# Patient Record
Sex: Male | Born: 1945
Health system: Southern US, Community
[De-identification: ages and names within clinical notes are randomized; demographics above are authoritative.]

## PROBLEM LIST (undated history)

## (undated) DIAGNOSIS — G43109 Migraine with aura, not intractable, without status migrainosus: Secondary | ICD-10-CM

## (undated) DIAGNOSIS — K219 Gastro-esophageal reflux disease without esophagitis: Secondary | ICD-10-CM

## (undated) DIAGNOSIS — R112 Nausea with vomiting, unspecified: Secondary | ICD-10-CM

## (undated) DIAGNOSIS — J189 Pneumonia, unspecified organism: Secondary | ICD-10-CM

## (undated) DIAGNOSIS — Z9889 Other specified postprocedural states: Secondary | ICD-10-CM

## (undated) DIAGNOSIS — K2270 Barrett's esophagus without dysplasia: Secondary | ICD-10-CM

## (undated) DIAGNOSIS — N189 Chronic kidney disease, unspecified: Secondary | ICD-10-CM

## (undated) DIAGNOSIS — C801 Malignant (primary) neoplasm, unspecified: Secondary | ICD-10-CM

## (undated) DIAGNOSIS — E559 Vitamin D deficiency, unspecified: Secondary | ICD-10-CM

## (undated) DIAGNOSIS — T7840XA Allergy, unspecified, initial encounter: Secondary | ICD-10-CM

## (undated) DIAGNOSIS — I10 Essential (primary) hypertension: Secondary | ICD-10-CM

## (undated) DIAGNOSIS — Z87442 Personal history of urinary calculi: Secondary | ICD-10-CM

## (undated) DIAGNOSIS — J45909 Unspecified asthma, uncomplicated: Secondary | ICD-10-CM

## (undated) DIAGNOSIS — Z860100 Personal history of colon polyps, unspecified: Secondary | ICD-10-CM

## (undated) DIAGNOSIS — M722 Plantar fascial fibromatosis: Secondary | ICD-10-CM

## (undated) DIAGNOSIS — M1990 Unspecified osteoarthritis, unspecified site: Secondary | ICD-10-CM

## (undated) DIAGNOSIS — K759 Inflammatory liver disease, unspecified: Secondary | ICD-10-CM

## (undated) HISTORY — DX: Vitamin D deficiency, unspecified: E55.9

## (undated) HISTORY — PX: KIDNEY STONE SURGERY: SHX686

## (undated) HISTORY — DX: Allergy, unspecified, initial encounter: T78.40XA

## (undated) HISTORY — PX: COLONOSCOPY: SHX174

## (undated) HISTORY — DX: Unspecified asthma, uncomplicated: J45.909

## (undated) HISTORY — DX: Unspecified osteoarthritis, unspecified site: M19.90

## (undated) HISTORY — DX: Gastro-esophageal reflux disease without esophagitis: K21.9

## (undated) HISTORY — DX: Personal history of colonic polyps: Z86.010

## (undated) HISTORY — DX: Essential (primary) hypertension: I10

## (undated) HISTORY — DX: Personal history of colon polyps, unspecified: Z86.0100

## (undated) HISTORY — DX: Chronic kidney disease, unspecified: N18.9

## (undated) HISTORY — DX: Barrett's esophagus without dysplasia: K22.70

## (undated) HISTORY — DX: Migraine with aura, not intractable, without status migrainosus: G43.109

## (undated) HISTORY — PX: POLYPECTOMY: SHX149

---
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
## 1995-02-08 DIAGNOSIS — J45909 Unspecified asthma, uncomplicated: Secondary | ICD-10-CM

## 1995-02-08 HISTORY — DX: Unspecified asthma, uncomplicated: J45.909

## 2002-09-11 ENCOUNTER — Observation Stay (HOSPITAL_COMMUNITY): Admission: AD | Admit: 2002-09-11 | Discharge: 2002-09-13 | Payer: Self-pay | Admitting: Pulmonary Disease

## 2002-09-11 ENCOUNTER — Encounter: Payer: Self-pay | Admitting: Pulmonary Disease

## 2002-11-04 ENCOUNTER — Inpatient Hospital Stay (HOSPITAL_COMMUNITY): Admission: EM | Admit: 2002-11-04 | Discharge: 2002-11-06 | Payer: Self-pay | Admitting: Emergency Medicine

## 2002-11-04 ENCOUNTER — Encounter: Payer: Self-pay | Admitting: Internal Medicine

## 2002-11-05 ENCOUNTER — Encounter: Payer: Self-pay | Admitting: Internal Medicine

## 2002-11-06 ENCOUNTER — Ambulatory Visit (HOSPITAL_COMMUNITY): Admission: RE | Admit: 2002-11-06 | Discharge: 2002-11-06 | Payer: Self-pay | Admitting: General Surgery

## 2002-11-06 ENCOUNTER — Encounter (INDEPENDENT_AMBULATORY_CARE_PROVIDER_SITE_OTHER): Payer: Self-pay | Admitting: Specialist

## 2002-11-25 ENCOUNTER — Ambulatory Visit (HOSPITAL_COMMUNITY): Admission: RE | Admit: 2002-11-25 | Discharge: 2002-11-25 | Payer: Self-pay | Admitting: Pulmonary Disease

## 2002-11-25 ENCOUNTER — Encounter: Payer: Self-pay | Admitting: Pulmonary Disease

## 2003-02-08 HISTORY — PX: CHOLECYSTECTOMY: SHX55

## 2004-03-01 ENCOUNTER — Ambulatory Visit: Payer: Self-pay | Admitting: Pulmonary Disease

## 2004-09-07 ENCOUNTER — Ambulatory Visit: Payer: Self-pay | Admitting: Pulmonary Disease

## 2004-12-15 ENCOUNTER — Ambulatory Visit: Payer: Self-pay | Admitting: Pulmonary Disease

## 2005-12-14 ENCOUNTER — Ambulatory Visit: Payer: Self-pay | Admitting: Pulmonary Disease

## 2006-03-30 ENCOUNTER — Ambulatory Visit: Payer: Self-pay | Admitting: Pulmonary Disease

## 2006-05-01 ENCOUNTER — Ambulatory Visit: Payer: Self-pay | Admitting: Pulmonary Disease

## 2006-11-22 ENCOUNTER — Ambulatory Visit: Payer: Self-pay | Admitting: Pulmonary Disease

## 2006-12-08 ENCOUNTER — Ambulatory Visit: Payer: Self-pay | Admitting: Pulmonary Disease

## 2006-12-08 LAB — CONVERTED CEMR LAB
ALT: 43 units/L (ref 0–53)
Alkaline Phosphatase: 95 units/L (ref 39–117)
BUN: 11 mg/dL (ref 6–23)
Basophils Relative: 0.7 % (ref 0.0–1.0)
Bilirubin Urine: NEGATIVE
CO2: 31 meq/L (ref 19–32)
Calcium: 9.1 mg/dL (ref 8.4–10.5)
Creatinine, Ser: 1.2 mg/dL (ref 0.4–1.5)
Eosinophils Relative: 1.9 % (ref 0.0–5.0)
Glucose, Bld: 112 mg/dL — ABNORMAL HIGH (ref 70–99)
HCT: 45.7 % (ref 39.0–52.0)
Hemoglobin: 16 g/dL (ref 13.0–17.0)
Leukocytes, UA: NEGATIVE
Monocytes Absolute: 0.5 10*3/uL (ref 0.2–0.7)
Monocytes Relative: 11.1 % — ABNORMAL HIGH (ref 3.0–11.0)
Nitrite: NEGATIVE
Potassium: 4.9 meq/L (ref 3.5–5.1)
RDW: 11.8 % (ref 11.5–14.6)
Specific Gravity, Urine: 1.02 (ref 1.000–1.03)
Total Bilirubin: 1.2 mg/dL (ref 0.3–1.2)
Total Protein, Urine: NEGATIVE mg/dL
Total Protein: 6.6 g/dL (ref 6.0–8.3)
Urobilinogen, UA: 0.2 (ref 0.0–1.0)
VLDL: 10 mg/dL (ref 0–40)
WBC: 4.8 10*3/uL (ref 4.5–10.5)
pH: 6 (ref 5.0–8.0)

## 2006-12-16 DIAGNOSIS — Z9189 Other specified personal risk factors, not elsewhere classified: Secondary | ICD-10-CM | POA: Insufficient documentation

## 2006-12-16 DIAGNOSIS — J45909 Unspecified asthma, uncomplicated: Secondary | ICD-10-CM

## 2006-12-16 DIAGNOSIS — K219 Gastro-esophageal reflux disease without esophagitis: Secondary | ICD-10-CM

## 2006-12-16 DIAGNOSIS — N200 Calculus of kidney: Secondary | ICD-10-CM

## 2006-12-16 DIAGNOSIS — D126 Benign neoplasm of colon, unspecified: Secondary | ICD-10-CM | POA: Insufficient documentation

## 2006-12-16 DIAGNOSIS — F411 Generalized anxiety disorder: Secondary | ICD-10-CM | POA: Insufficient documentation

## 2006-12-16 DIAGNOSIS — J301 Allergic rhinitis due to pollen: Secondary | ICD-10-CM

## 2007-04-05 ENCOUNTER — Telehealth: Payer: Self-pay | Admitting: Pulmonary Disease

## 2007-07-30 ENCOUNTER — Telehealth (INDEPENDENT_AMBULATORY_CARE_PROVIDER_SITE_OTHER): Payer: Self-pay | Admitting: *Deleted

## 2007-07-31 ENCOUNTER — Ambulatory Visit: Payer: Self-pay | Admitting: Pulmonary Disease

## 2007-08-20 ENCOUNTER — Ambulatory Visit: Payer: Self-pay | Admitting: Pulmonary Disease

## 2007-08-20 ENCOUNTER — Ambulatory Visit: Payer: Self-pay | Admitting: Internal Medicine

## 2007-11-21 ENCOUNTER — Ambulatory Visit: Payer: Self-pay | Admitting: Pulmonary Disease

## 2007-12-03 ENCOUNTER — Telehealth: Payer: Self-pay | Admitting: Pulmonary Disease

## 2007-12-10 ENCOUNTER — Ambulatory Visit: Payer: Self-pay | Admitting: Pulmonary Disease

## 2008-02-26 DIAGNOSIS — R51 Headache: Secondary | ICD-10-CM

## 2008-02-26 DIAGNOSIS — R519 Headache, unspecified: Secondary | ICD-10-CM | POA: Insufficient documentation

## 2008-02-26 DIAGNOSIS — F909 Attention-deficit hyperactivity disorder, unspecified type: Secondary | ICD-10-CM

## 2008-10-24 ENCOUNTER — Encounter: Payer: Self-pay | Admitting: Adult Health

## 2008-10-24 ENCOUNTER — Ambulatory Visit: Payer: Self-pay | Admitting: Internal Medicine

## 2008-10-24 ENCOUNTER — Telehealth: Payer: Self-pay | Admitting: Pulmonary Disease

## 2008-10-24 DIAGNOSIS — R5383 Other fatigue: Secondary | ICD-10-CM

## 2008-10-24 DIAGNOSIS — R5381 Other malaise: Secondary | ICD-10-CM

## 2008-10-28 LAB — CONVERTED CEMR LAB
ALT: 40 units/L (ref 0–53)
Albumin: 3.7 g/dL (ref 3.5–5.2)
CO2: 31 meq/L (ref 19–32)
Calcium: 9 mg/dL (ref 8.4–10.5)
Creatinine, Ser: 1.1 mg/dL (ref 0.4–1.5)
Glucose, Bld: 121 mg/dL — ABNORMAL HIGH (ref 70–99)
Hemoglobin, Urine: NEGATIVE
Leukocytes, UA: NEGATIVE
Nitrite: NEGATIVE
PSA: 0.92 ng/mL (ref 0.10–4.00)
Total Protein: 6.7 g/dL (ref 6.0–8.3)
Urobilinogen, UA: 0.2 (ref 0.0–1.0)

## 2009-02-03 ENCOUNTER — Ambulatory Visit: Payer: Self-pay | Admitting: Pulmonary Disease

## 2009-02-08 LAB — CONVERTED CEMR LAB
Basophils Relative: 0.6 % (ref 0.0–3.0)
CO2: 28 meq/L (ref 19–32)
Eosinophils Absolute: 0.1 10*3/uL (ref 0.0–0.7)
GFR calc non Af Amer: 80.74 mL/min (ref >60)
Glucose, Bld: 97 mg/dL (ref 70–99)
Hemoglobin: 15.4 g/dL (ref 13.0–17.0)
Lymphocytes Relative: 23.4 % (ref 12.0–46.0)
MCHC: 33.4 g/dL (ref 30.0–36.0)
Monocytes Relative: 9.2 % (ref 3.0–12.0)
Neutro Abs: 3.6 10*3/uL (ref 1.4–7.7)
Potassium: 4.5 meq/L (ref 3.5–5.1)
RBC: 4.83 M/uL (ref 4.22–5.81)
Sodium: 142 meq/L (ref 135–145)
VLDL: 13 mg/dL (ref 0.0–40.0)

## 2009-07-28 ENCOUNTER — Telehealth (INDEPENDENT_AMBULATORY_CARE_PROVIDER_SITE_OTHER): Payer: Self-pay | Admitting: *Deleted

## 2009-08-31 ENCOUNTER — Telehealth (INDEPENDENT_AMBULATORY_CARE_PROVIDER_SITE_OTHER): Payer: Self-pay | Admitting: *Deleted

## 2009-09-22 ENCOUNTER — Encounter (INDEPENDENT_AMBULATORY_CARE_PROVIDER_SITE_OTHER): Payer: Self-pay | Admitting: *Deleted

## 2010-01-29 ENCOUNTER — Encounter: Payer: Self-pay | Admitting: Pulmonary Disease

## 2010-01-29 ENCOUNTER — Ambulatory Visit: Payer: Self-pay | Admitting: Pulmonary Disease

## 2010-01-29 LAB — CONVERTED CEMR LAB
ALT: 31 units/L (ref 0–53)
AST: 21 units/L (ref 0–37)
Albumin: 3.9 g/dL (ref 3.5–5.2)
Alkaline Phosphatase: 96 units/L (ref 39–117)
Basophils Relative: 0.3 % (ref 0.0–3.0)
Bilirubin Urine: NEGATIVE
Blood, UA: NEGATIVE
Calcium: 9.2 mg/dL (ref 8.4–10.5)
Cholesterol: 194 mg/dL (ref 0–200)
Creatinine, Ser: 1.2 mg/dL (ref 0.4–1.5)
Eosinophils Relative: 0.5 % (ref 0.0–5.0)
GFR calc non Af Amer: 67.14 mL/min (ref >60.00)
HDL: 50.2 mg/dL (ref >39.00)
LDL Cholesterol: 131 mg/dL — ABNORMAL HIGH (ref 0–99)
Lymphocytes Relative: 15.8 % (ref 12.0–46.0)
Neutrophils Relative %: 74.2 % (ref 43.0–77.0)
RBC: 4.93 M/uL (ref 4.22–5.81)
Total CHOL/HDL Ratio: 4
Total Protein, Urine: NEGATIVE mg/dL
Total Protein: 6.5 g/dL (ref 6.0–8.3)
Triglycerides: 64 mg/dL (ref 0.0–149.0)
Urine Glucose: NEGATIVE mg/dL
WBC: 8 10*3/uL (ref 4.5–10.5)
pH: 6 (ref 5.0–8.0)

## 2010-03-09 NOTE — Progress Notes (Signed)
  Phone Note Other Incoming   Request: Send information Summary of Call: Request for records received from [facility]. Request forwarded to [facility].     

## 2010-03-09 NOTE — Letter (Signed)
Summary: Endoscopy Letter  [facility]  [address] [place], [place] [address]   Phone: [phone]  Fax: [phone]      September 22, 2009 MRN: [id number]   Benjamin Cortez [address] [place], [place]  [address]   Dear Benjamin Cortez,   According to your medical record, it is time for you to schedule an Endoscopy. Endoscopic screening is recommended for patients with certain upper digestive tract conditions because of associated increased risk for cancers of the upper digestive system.  This letter has been generated based on the recommendations made at the time of your prior procedure. If you feel that in your particular situation this may no longer apply, please contact our office.  Please call our office at [phone]) to schedule this appointment or to update your records at your earliest convenience.  Thank you for cooperating with [place] to provide you with the very best care possible.   Sincerely,  Hedwig Morton. Juanda Chance, M.D.  [facility] [phone]

## 2010-03-11 NOTE — Assessment & Plan Note (Signed)
Summary: yearly follow up visit/kcw   CC:  Yearly ROV & review of mult medical problems....  History of Present Illness: 65 y/o WM here for a follow up visit...    ~  February 03, 2009:  he's had a good yr x episode of gastroenteritis in Sep (wife had salmonella)... notes some reflux & takes OMEP 20mg /d w/ control of symptoms... no other complaints or concerns.   ~  January 29, 2010:  he continues to do well- no new complaints or concerns x left hip bursitis & mild lumbar disc degen Rx'd by Alinda Sierras w/ shot, heat, PT, etc & improved...  he denies CP, palpit, SOB, etc;  BP normal;  GERD regulated by Omep OTC;  colonoscopy due 2013 & he denies symptoms;  he had the 2011 Flu vaccine already...    Current Problems:   ALLERGIC RHINITIS (ICD-477.9) - on Claritin OTC Prn... prev testing showed mold sensitivity.  ASTHMATIC BRONCHITIS, ACUTE (ICD-466.0) - takes MUCINEX 1-2 Bid Prn & he uses his wife's cough syrup Prn... he would like ZPak for Prn use...  CHEST WALL PAIN, HX OF (ICD-V15.89) - on ASA 325mg /d...  ~  he participated in a [place] research study on atherosclerosis in 2000- as part of the protocol he had:      ** Cardiac CT - calcium score= zero...      ** Carotid Dopplers - negative, no signif blockage...      ** ABI's were normal...  ~  NuclearStressTest 9/04 showed mildly abn EKG but neg images- w/o ischemia or infarct, EF=67%...  GERD (ICD-530.81) - on OMEPRAZOLE 20mg /d (OTC generic med).  ~  last EGD 9/04 by DrDBrodie showed 3cm HH r/o barrett's- bx showed mild inflamm only... subseq had GB removed 9/04 by DrHoxsworth...  COLONIC POLYPS (ICD-211.3)  ~  last colonoscopy 12/03 by DrDBrodie showed 5mm polyp- hyperplastic on bx... f/u planned 2013.  RENAL CALCULUS (ICD-592.0) - hx kidney stone passed in 1989 & saw DrPeterson in past..  Hx of HEADACHE (ICD-784.0) - prev HA eval by DrLewitt in 2005... Dx w/ migraines w/ visual symptoms... not currently active or requiring  meds...  ATTENTION DEFICIT DISORDER, HX OF (ICD-V11.8) - on RITALIN 20mg  Prn (?he uses son's Rx)... prev eval by Psychiatry/ Psychology w/ Adult ADD on Ritalin 20mg /d and doing well... he wants to continue Rx due to work, home life, etc... tolerates med well & denies side effects etc...  ANXIETY (ICD-300.00)   Preventive Screening-Counseling & Management  Alcohol-Tobacco     Smoking Status: quit     Year Quit: 1979     Pack years: 32yrs, 1ppd  Allergies: 1)  ! Penicillin 2)  ! Tetracycline 3)  Codeine  Comments:  Nurse/Medical Assistant: The patient's medications and allergies were reviewed with the patient and were updated in the Medication and Allergy Lists.  Past History:  Past Medical History: ALLERGIC RHINITIS (ICD-477.9) ASTHMATIC BRONCHITIS, ACUTE (ICD-466.0) CHEST WALL PAIN, HX OF (ICD-V15.89) GERD (ICD-530.81) COLONIC POLYPS (ICD-211.3) RENAL CALCULUS (ICD-592.0) Hx of HEADACHE (ICD-784.0) ATTENTION DEFICIT DISORDER, HX OF (ICD-V11.8) ANXIETY (ICD-300.00)  Past Surgical History: S/P appendectomy S/P cholecystectomy  Family History: Reviewed history from 10/24/2008 and no changes required. emphysema - mother allergies - mother heart disease - PGF died of MI (age 76s) rheumatism - mother  Social History: Reviewed history from 10/24/2008 and no changes required. 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         All other systems reviewed and neg x as noted...  Vital Signs:  Patient profile:   65 year old male Height:      67 inches Weight:      227.50 pounds BMI:     35.76 O2 Sat:      98 % on Room air Temp:     98.6 degrees F oral Pulse rate:   67 / minute BP sitting:   134 / 82  (right arm) Cuff size:   regular  Vitals Entered By: Randell Loop CMA (January 29, 2010 9:35 AM)  O2 Sat at Rest %:  98 O2 Flow:  Room air CC: Yearly ROV & review of mult medical problems... Is Patient Diabetic? No Pain Assessment Patient in pain? no      Comments no changes in meds today    Physical Exam  Additional Exam:  WD, WN, 65 y/o WM in NAD... GENERAL:  Alert & oriented; pleasant & cooperative... HEENT:  [place]/AT, EOM-wnl, PERRLA, Fundi-benign, EACs-clear, TMs-wnl, NOSE-clear, THROAT-clear & wnl. NECK:  Supple w/ full ROM; no JVD; normal carotid impulses w/o bruits; no thyromegaly or nodules palpated; no lymphadenopathy. CHEST:  Clear to P & A; without wheezes/ rales/ or rhonchi. HEART:  Regular Rhythm; without murmurs/ rubs/ or gallops. ABDOMEN:  Soft & nontender; normal bowel sounds; no organomegaly or masses detected, no guarding or rebound or bruits.  EXT: without deformities or arthritic changes; no varicose veins/ venous insuffic/ or edema. NEURO:  CN's intact; motor testing normal; sensory testing normal; gait normal & balance OK. DERM:  No lesions noted; no rash etc...    MISC. Report  Procedure date:  01/29/2010  Findings:      Data Reviewed:  ~  FASTING blood work 01/29/10...    Impression & Recommendations:  Problem # 1:  PHYSICAL EXAMINATION (ICD-V70.0)  Orders: TLB-BMP (Basic Metabolic Panel-BMET) (80048-METABOL) TLB-Hepatic/Liver Function Pnl (80076-HEPATIC) TLB-CBC Platelet - w/Differential (85025-CBCD) TLB-Lipid Panel (80061-LIPID) TLB-TSH (Thyroid Stimulating Hormone) (84443-TSH) TLB-PSA (Prostate Specific Antigen)  (84153-PSA) TLB-Udip w/ Micro (81001-URINE) T-Vitamin D (25-Hydroxy) ([address])  Problem # 2:  ASTHMATIC BRONCHITIS, ACUTE (ICD-466.0) Stable>  we discussed ZPak for Prn use this winter... The following medications were removed from the medication list:    Tussionex Pennkinetic Er 8-10 Mg/90ml Lqcr (Chlorpheniramine-hydrocodone) .Marland Kitchen... 1 tsp two times a day as needed cough His updated medication list for this problem includes:    Mucinex 600 Mg Tb12 (Guaifenesin) .Marland Kitchen... As needed  Problem # 3:  GERD (ICD-530.81) Stable on the PPI Rx... His updated medication list for this problem includes:    Omeprazole 20 Mg Cpdr (Omeprazole) .Marland Kitchen... Take 1 tablet by mouth once a day  Problem # 4:  COLONIC POLYPS (ICD-211.3) He is up to date and due 2013...  Problem # 5:  Hx of HEADACHE (ICD-784.0) Stable w/o recurrent severe HAs... His updated medication list for this problem includes:    Cvs Aspirin 325 Mg Tabs (Aspirin) .Marland Kitchen... Take 1 tablet by mouth once a day  Problem # 6:  ATTENTION DEFICIT DISORDER, HX OF (ICD-V11.8) He uses the Ritalin 20mg  Prn...  Problem # 7:  OTHER MEDICAL PROBLEMS AS NOTED>>  Complete Medication List: 1)  Claritin 10 Mg Tabs (Loratadine) .... Take 1 tablet by mouth once a day 2)  Mucinex 600 Mg Tb12 (Guaifenesin) .... As needed 3)  Cvs Aspirin 325 Mg Tabs (Aspirin) .... Take 1 tablet by mouth once a day 4)  Omeprazole 20 Mg Cpdr (Omeprazole) .... Take 1 tablet by mouth once a day 5)  Ritalin 20 Mg Tabs (Methylphenidate hcl) .... Take as directed...  Patient Instructions: 1)  Today we updated your med list- see below.... 2)  Today we did your follow up FASTINg blood work... please call the "phone tree" in a few days for your lab results.Marland KitchenMarland Kitchen 3)  Let's get on track w/ our diet & exercise program... 4)  Call for any problems.Marland KitchenMarland Kitchen 5)  Please schedule a follow-up appointment in 1 year, sooner as needed.   Immunization History:  Influenza Immunization History:     Influenza:  historical (01/13/2010)

## 2010-06-25 NOTE — H&P (Signed)
NAME:  Benjamin Cortez, CAVITT NO.:  [id number]   MEDICAL RECORD NO.:  [id number]                   PATIENT TYPE:  EMS   LOCATION:  ED                                   [facility]ACILITY[facility]  [facility]   PHYSICIAN:  Iva Boop, M.D. [facility]           DATE OF BIRTH:  1945/11/13   DATE OF ADMISSION:  11/04/2002  DATE OF DISCHARGE:                                HISTORY & PHYSICAL   CHIEF COMPLAINT:  Fever and upper abdominal pain into the left upper  quadrant.   HISTORY:  Benjamin Cortez is a 65 year old white male with history of GERD, asthma,  ADD, ureteral lithiasis, and colon polyps.  He is status post appendectomy.  He was admitted to the cardiology service in August 2004 with chest pain,  had a negative cardiac workup at that time.  He had an episode of epigastric  pain on October 12, 2002 which radiated to his shoulders and neck.  This  episode lasted about two days and resolved.  He was seen thereafter by Dr.  Juanda Chance on October 16, 2002 and was started on Protonix.  Had upper  endoscopy scheduled which was done on September 21 which was normal with the  exception of possible Barrett's.  He had abdominal ultrasound on September  21 which was positive for cholelithiasis and evidence of probable chronic  cholecystitis with gallbladder wall thickening at approximately 4 mm.  There  was no pericholecystic fluid noted.  Common bile duct was 4 mm.  He was  referred to Dr. Earlene Plater for surgery.  He says that he began having some fever  on September 24 when he was seen in Dr. Jinny Sanders office that was documented  at 100.6.  He has had somewhat progressive abdominal since in the upper  abdomen and now more localized to the left upper quadrant with radiation  into his shoulders and chest.  He says he has had some increased discomfort  with inspiration.  He has had temperatures in the 100 range all weekend.  His temperature was 100.6 this morning and with continued pain he called the  office  and was advised to come to the emergency room.  He has been using  Vicodin at home for pain as well.   Laboratory studies in the ER:  Hemoglobin of 8.7, hemoglobin 14.5,  hematocrit of 42.1.  The remainder of his labs are pending at this time.   MEDICATIONS:  1. Protonix 40 p.o. daily.  2. Ritalin 20 mg daily.   ALLERGIES:  1. PENICILLIN which causes a rash.  2. TETRACYCLINE which causes a rash.   PAST HISTORY:  As outlined above.   SOCIAL HISTORY:  The patient is married.  He is no tobacco x34 years, no  regular ETOH.  He is employed in Dance movement psychotherapist.   FAMILY HISTORY:  Noncontributory.   REVIEW OF SYSTEMS:  Negative other than above.  PHYSICAL EXAMINATION:  GENERAL:  Well-developed white male, semi-sedated in  the ER post Dilaudid and Phenergan per the ER M.D.  He is arousable.  VITAL SIGNS:  Temperature is 100.6, blood pressure 147/80, pulse in the 90s,  saturation is 94 on room air.  HEENT:  Nontraumatic, normocephalic.  EOMI, PERLA, sclerae anicteric.  NECK:  Supple without nodes.  CARDIOVASCULAR:  Regular rate and rhythm with S1 and S2.  No murmur, rub, or  gallop.  PULMONARY:  Clear to A&P.  ABDOMEN:  Soft, bowel sounds are active.  He is tender in the epigastrium  and left upper quadrant.  There is no guarding or rebound, no mass or  hepatosplenomegaly felt.  RECTAL:  Exam is not done at this time - the patient in the ER hallway.  EXTREMITIES:  No clubbing, cyanosis, or edema.  NEUROLOGIC:  Nonfocal.   IMPRESSION:  8. A 65 year old white male with cholelithiasis, probable chronic     cholecystitis, now with fevers and left upper quadrant pain; rule out     biliary pancreatitis.  2. Gastroesophageal reflux disease.  3. Attention deficit disorder.  4. History of ureterolithiasis.  5. History of asthmatic bronchitis.  6. History of colon polyps.  7. Status post appendectomy.   PLAN:  The patient is admitted to the service of Dr. Stan Head who is   covering the hospital.  He will be hydrated, kept n.p.o., pain control.  Will cover with IV antibiotics.  Check chest x-ray PA and lateral, and  depending on his labs will consider need for CT scan versus repeat  ultrasound.  Eventually he will need surgical consultation this admission.  For details please see the orders.     Mike Gip, P.A.-C. [facility]                Iva Boop, M.D. [facility]    AE/MEDQ  D:  11/04/2002  T:  11/04/2002  Job:  [id number]   cc:   Sheppard Plumber. Earlene Plater, M.D.  [address] [place]  [place] [address]  Fax: [phone]

## 2010-06-25 NOTE — Assessment & Plan Note (Signed)
[facility]                             PULMONARY OFFICE NOTE   AKIN, YI                           MRN:          [id number]  DATE:03/30/2006                            DOB:          Jun 30, 1945    HISTORY OF PRESENT ILLNESS:  Patient is a 65 year old white male patient  of Dr. Kriste Basque, has a known history of asthmatic bronchitis, presents for  an acute office visit.  Patient complains of a one-week history of  productive cough with thick yellow sputum, nasal congestion, sore  throat.  Patient has been using over the counter cough products without  any relief.  Patient denies any chest pain, orthopnea, PND or leg  swelling.  No recent travel or antibiotic use.   PAST MEDICAL HISTORY:  Reviewed.   CURRENT MEDICATIONS:  Reviewed.   PHYSICAL EXAMINATION:  GENERAL:  The patient is a pleasant male in no  acute distress.  He is afebrile.  VITAL SIGNS:  normal , saturation 96% on room air.  HEENT:  Nasal mucosa with some mild erythema.  Nontender sinuses.  Posterior pharynx is clear.  NECK:  Supple without adenopathy.  LUNGS:  Sounds are clear to auscultation bilaterally.  CARDIAC:  Regular rate and rhythm.  ABDOMEN:  Soft nontender.  EXTREMITIES:  Warm without any edema.   IMPRESSION/PLAN:  Acute upper respiratory infection.  Patient is to  begin a  Z-Pack, Mucinex DM twice daily.  Endal HD 8 ounces, one to two  teaspoons every 4-6 hours for cough.  Patient to return back with Dr.  Kriste Basque as scheduled or sooner if needed.      Rubye Oaks, NP  Electronically Signed      Lonzo Cloud. Kriste Basque, MD  Electronically Signed   TP/MedQ  DD: 03/30/2006  DT: 03/30/2006  Job #: [id number]

## 2010-06-25 NOTE — Consult Note (Signed)
NAME:  Benjamin Cortez, BORCHERS NO.:  [id number]   MEDICAL RECORD NO.:  [id number]                   PATIENT TYPE:  INP   LOCATION:  0479                                 FACILITY:  [facility]   PHYSICIAN:  Lorne Skeens. Hoxworth, M.D.          DATE OF BIRTH:  06-19-1945   DATE OF CONSULTATION:  11/04/2002  DATE OF DISCHARGE:                                   CONSULTATION   CHIEF COMPLAINT:  Abdominal pain, chest pain, fever.   HISTORY OF PRESENT ILLNESS:  I was asked by Dr. Kriste Basque and Dr. Juanda Chance to  evaluate Mr. Benjamin Cortez.  He is a 65 year old white male who was in his usual  state of health until September 11, 2002, when he presented with acute severe  epigastric pain radiating up to his chest and both shoulders.  He describes  a constant aching pain.  There was no associated nausea, vomiting, or fever.  The patient was admitted to the hospital at that time, and underwent a  cardiac work-up that was negative.  He subsequently has had a further  outpatient cardiac work-up by Dr. Gerri Spore that was nonrevealing.  The  patient's pain required a lot of medication at that time, but gradually  improved over about three days, and he was discharged for further outpatient  follow up.  He states about a week later he had a similar episode of pain  while he was out of town that lasted a day or two.  Since that time, he has  continued to have some nagging epigastric pain that radiates toward his  chest and both costal margins.  Still has no nausea or vomiting.  The pain  is not exacerbated or relieved by eating.  He feels the pain is occasionally  worse with physical activity.   The patient was seen by Dr. Lina Sar and had an evaluation including an  upper endoscopy that revealed a questionable small area of Barrett's  esophagus, but no ulcer or significant inflammation.  Gallbladder ultrasound  was obtained, which revealed multiple gallstones, and slightly thickened  gallbladder  wall.  This was performed October 29, 2002.  The patient  subsequently saw Dr. Kendrick Ranch in our office, and with the above history and  findings, cholecystectomy was recommended.  This has actually been scheduled  for later this week, but the patient presents today with three days of  initially low-grade fever that became more significant over the last couple  of days, and recurrent epigastric pain that has not been quite as severe as  his previous episodes.  Over the last 24 hours, the pain appeared to be more  localized in the left upper quadrant and left subcostal area.  Again, no  nausea, vomiting.  No chills.  No dark urine or scleral icterus noted.  Bowel movements have been normal.   The patient gives a history about 12 years ago of  having possible  pancreatitis.  This was evaluated by Dr. Juanda Chance, and it sounds as though  gallbladder sludge was noted at that time on ultrasound, and apparently  there was an attempt at ERCP per the patient's recollection.  He states he  has had occasional mild nagging right subcostal to right flank pain for a  number of years.   PAST MEDICAL HISTORY:  1. Surgical - appendectomy in the 1950's.  2. Medical - he has been treated for ureterolithiasis.  He is followed for     GERD, anxiety/ADD.  He has had colonoscopy for colon polys.  He has a     history of seasonal allergies with bronchitis.   CURRENT MEDICATIONS:  1. Ritalin 20 mg a day.  2. Protonix 40 mg a day.   ALLERGIES:  1. PENICILLIN.  2. TETRACYCLINE.  3. CODEINE.   SOCIAL HISTORY:  He is married.  He does not smoke cigarettes or drink  alcohol.   FAMILY HISTORY:  Father died of leukemia and had gallbladder disease.  Mother died of unknown causes.  He has a brother and sister alive and well.   REVIEW OF SYSTEMS:  GENERAL:  Positive for fever this illness.  No weight  change.  HEENT:  Positive for some seasonal allergies.  RESPIRATORY:  Denies  current shortness of breath, cough,  or sputum production.  CARDIAC:  As in  history of present illness.  ABDOMEN:  As in history of present illness.  GU:  No urinary burning, frequency.   PHYSICAL EXAMINATION:  VITAL SIGNS:  Temperature is 100.4, pulse 90, blood  pressure 147/79, respirations 20.  GENERAL:  He is a well-nourished, well-developed, white male in no acute  distress.  SKIN:  Warm and dry without rash or infection.  LYMPH NODES:  No cervical, supraclavicular, or inguinal nodes palpable.  HEENT:  Eyes - sclerae nonicteric.  Pupils equal, round and reactive to  light.  Oropharynx clear without mass.  NECK:  No palpable mass or thyromegaly.  CHEST:  Clear to auscultation without pleural rub.  CARDIAC:  Regular rhythm without murmurs.  Peripheral pulses intact.  ABDOMEN:  There is mild tenderness across the epigastrium and both subcostal  areas, somewhat greater on the left with no guarding.  No masses or  organomegaly.  EXTREMITIES:  No deformity or joint swelling.  NEUROLOGIC:  Alert, fully oriented.  Motor and sensory exams grossly normal.   LABORATORY DATA:  This admission, CBC, electrolytes, LFTs, amylase, lipase,  and urinalysis, all within normal limits.   IMAGING:  Chest x-ray is normal.  Repeat gallbladder ultrasound shows  stones, but no wall thickening, pericholecystic fluid, or other evidence of  acute cholecystitis.   CT scan of the chest and abdomen September 11, 2002 were unremarkable.   ASSESSMENT AND PLAN:  Certainly his acute presentation in August with acute  epigastric pain, negative cardiac work-up, and ultrasound showing stones  clearly sounds like biliary colic.  He apparently has some questionable  remote history of pancreatitis.  His current presentation is a little more  unusual with low-grade fever despite no other evidence of cholecystitis on  ultrasound, and more of a tendency toward left upper quadrant pain.  This is clinically consistent with acute pancreatitis, although this is  not  confirmed with elevated enzymes.  He certainly has persistent and recurrent  upper abdominal pain with a thorough work-up to date negative, except for  gallstones.  Although his current presentation is a little atypical, I  certainly  believe with the overall presentation that cholecystectomy with  cholangiogram is indicated.  I do not feel any further work-up is mandatory.  Will discuss with GI medicine, but I would recommend proceeding in the near  future with laparoscopic cholecystectomy with cholangiogram.                                               Sharlet Salina T. Hoxworth, M.D.    Tory Emerald  D:  11/04/2002  T:  11/04/2002  Job:  [id number]

## 2010-06-25 NOTE — Discharge Summary (Signed)
NAME:  Benjamin Cortez, Benjamin Cortez NO.:  [id number]   MEDICAL RECORD NO.:  [id number]                   PATIENT TYPE:  INP   LOCATION:  2025                                 FACILITY:  [facility]   PHYSICIAN:  Lonzo Cloud. Kriste Basque, M.D. [facility]            DATE OF BIRTH:  July 18, 1945   DATE OF ADMISSION:  09/11/2002  DATE OF DISCHARGE:  09/13/2002                                 DISCHARGE SUMMARY   FINAL DIAGNOSES:  1. Admitted September 11, 2002 with severe chest and epigastric pain.     Evaluation revealed normal EKG and cardiac enzymes and patient improved     with narcotic analgesics and proton pump inhibitor therapy.  2. Chest wall pain -- severe, no signs of underlying cardiovascular disease.  3. History of hiatus hernia and gastroesophageal reflux disease with     epigastric discomfort and reflux esophagitis -- improved with proton pump     inhibitor therapy and Carafate. Further evaluation with GI and Dr. Lina Sar if symptoms persist.  4. History of asthmatic bronchitis and allergies with mold sensitivity on     testing in the past.  5. History of colon polyps with 5-mm, hyperplastic polyp removed December     2003 by Dr. Lina Sar.  6. History of adult attention-deficit disorder on Ritalin 20 mg a day     followed by Dr. Morene Antu.  7. History of kidney stones in 1979.  8. History of anxiety with increased stress at work recently.   ALLERGIES:  PENICILLIN, TETRACYCLINE and CODEINE   BRIEF HISTORY AND PHYSICAL:  The patient is a 65 year old white man known to  me, and last seen in December 2003 for a complete physical.  He presented to  the office on September 11, 2002 with onset of severe chest pain and upper  abdominal pain of 1 days duration.  He noted onset of midchest pain,  radiation to the shoulders with a sharp, burning quality and tenderness on  palpation in the day prior to admission. He had progressive symptoms as the  day went on and felt that this was  worse than my kidney stones.  He denied  nausea, vomiting, or diaphoresis.  He had no history of known heart disease.  He had no significant cardiac risk factors with a negative family history,  nonsmoker, and no cholesterol, diabetes, or blood pressure problems in the  past.  He did admit to marked increase in stress lately at work with a big  project and responsibility.  In addition, he had some increasing reflux  symptoms with pain in the epigastrium as well despite Protonix 40 mg p.o.  daily.  He had a previous endoscopy in 1999 by Dr. Lina Sar with a 3 cm  hiatus hernia and some reflux esophagitis noted at that time.  Of note, the  patient participated in the [facility]  of arteriosclerosis at [facility] in 2001.  During that evaluation he was found to have no  coronary calcification and normal carotid Dopplers (CT chest scan was done  in this study).   PAST MEDICAL HISTORY:  He has a history of asthmatic bronchitis and  allergies with known mold sensitivity on testing in the past.  As noted, he  has a history of hiatus hernia, gastroesophageal reflux disease, with a  previous endoscopy in 1999 showing a 3 cm hernia and reflux esophagitis.  He  has a history of colon polyps with a colonoscopy in December 2003 with Dr.  Lina Sar with biopsy of a 5-mm, hyperplastic polyp at that time.  He has  a history of adult ADD and takes Ritalin 20 mg a day under the direction of  Dr. Morene Antu.  He has a history of kidney stones, the last in 1979.  He  has a history of anxiety with increased stress recently as noted.   PHYSICAL EXAMINATION:  Physical examination revealed a 65 year old gentleman  in acute distress with chest and epigastric pain.  Blood pressure 150/90,  pulse 88 per minute and regular, respirations 22 per minute and not labored,  temperature 98 degrees.  HEENT exam is unremarkable.  Neck exam showed no  jugular venous distention, no carotid bruits, no  thyromegaly or  lymphadenopathy.  Chest exam was clear to percussion and auscultation.  Cardiac exam revealed a regular rhythm, normal S1, S2, without murmurs,  rubs, or gallops detected.  Chest wall was tender on palpation with some  reproduction of his chest discomfort.  Abdominal exam revealed epigastric  discomfort on palpation, but no evidence of organomegaly or masses and  normal bowel sounds.  Extremities showed no clubbing, cyanosis, or edema.  Neurologic exam was intact.   LABORATORY DATA:  EKG showed normal sinus rhythm and minor nonspecific ST-T  wave changes.  There were no acute abnormalities suspected.  Chest x-ray  showed some mild bibasilar atelectasis, otherwise clear.  A CT scan of the  chest and abdomen was performed.  This confirmed mild bibasilar dependent  atelectasis.  No evidence of any thoracic aneurysm or dissection.  Question  of an esophageal motility disorder versus reflux theme.  Abdominal scan  showed no evidence of aortic dissection or aneurysm and no acute abnormality  is detected on this scan.  Cardiac enzymes were negative with negative CPK,  negative MB and negative troponins.  Sodium 141, potassium 3..9, chloride  105, CO2 25, BUN 11, creatinine 1.2, blood sugar 119, calcium 8.8, total  protein 6.0, albumin 3.8, AST 20, ALT 34, alkaline phosphatase 83, total  bilirubin 1.1, ProTime 12.7 for an INR of 0.9 and a PTT of 25 seconds.  TSH  was 1.23.  Hemoglobin 15.6, hematocrit 45.0, white count 11,500 with 85%  segs.   HOSPITAL COURSE:  The patient was admitted with severe chest and abdominal  pain.  He was writhing in the office and pain was not relieved by Demerol  shot. He required IV morphine between 4 and 8 mg an hour to get control of  his discomfort.  He was placed on Protonix 40 mg p.o. b.i.d. and given  Carafate 1 gm q.i.d.  Cardiac enzymes and workup returned negative.  As noted, he had negative risk factor profile with no family history of   coronary disease, nonsmoker, and no history of hypercholesterolemia,  diabetes, or hypertension.  The addition of a heating pad to the chest wall  helped the chest wall pain component and he was eventually switched to p.o.  Vicodin 1-2 tablets q.4h. as needed for pain.  He improved over the first 24  hours of admission, and decision was made to change his admission to an  observation status and discharge the patient home for further evaluation as  an outpatient as necessary.  The etiology of his pain was felt to be  multifactorial with severe chest wall pain and reflux esophagitis.   MEDICATIONS AT DISCHARGE:  1. Protonix 40 mg p.o. b.i.d.  2. Carafate 1 gm dissolved in small glass of water and taken 1 hour after     meals and at bedtime (q.i.d.)  3. Vicodin 1 tablet q.4-6h. as needed for pain.  4. Alprazolam 0.5 mg p.o. t.i.d. as needed for nerves.  5. He will resume his home medications including Ritalin 20 mg per day and     Claritin as needed.  He was instructed to follow up with me in 1 week and     given an appointment on September 20, 2002 at 12 noon.   CONDITION AT DISCHARGE:  Improved.                                                Lonzo Cloud. Kriste Basque, M.D. [facility]    SMN/MEDQ  D:  09/14/2002  T:  09/14/2002  Job:  [id number]

## 2010-06-25 NOTE — Op Note (Signed)
NAME:  Benjamin Cortez, Benjamin Cortez NO.:  [id number]   MEDICAL RECORD NO.:  [id number]                   PATIENT TYPE:  INP   LOCATION:  0479                                 FACILITY:  [facility]   PHYSICIAN:  Lorne Skeens. Hoxworth, M.D.          DATE OF BIRTH:  06/07/1945   DATE OF PROCEDURE:  11/05/2002  DATE OF DISCHARGE:                                 OPERATIVE REPORT   PREOPERATIVE DIAGNOSIS:  Cholelithiasis.   POSTOPERATIVE DIAGNOSIS:  Cholelithiasis.   PROCEDURE:  Laparoscopic cholecystectomy with intraoperative cholangiogram.   SURGEON:  Sharlet Salina T. Hoxworth, M.D.   ASSISTANT:  Lebron Conners, M.D.   ANESTHESIA:  General.   BRIEF HISTORY:  [name] is a 65 year old white male who presents with  episodic epigastric and chest pain. Workup has included a gallbladder  ultrasound showing gallstones. Laparoscopic cholecystectomy with  cholangiogram has been recommended and accepted. The nature of the  procedure, indications, risks of bleeding, bile leak and bile duct injury  were discussed and understood. He is now brought to the operating room for  this procedure.   DESCRIPTION OF PROCEDURE:  The patient was brought to the operating room,  placed in supine position on the operating table and general endotracheal  anesthesia was induced. He was already on broad spectrum antibiotics. PAS  were in place. The abdomen was sterilely prepped and draped. Local  anesthesia was used to infiltrate the trocar sites prior to the incision. A  1 cm incision was made at the umbilicus, dissection carried down the midline  fascia which was sharply incised for 1 cm and the peritoneum entered under  direct vision. Through a mattress suture of #0 Vicryl, the Hasson trocar was  placed and pneumoperitoneum established. Under direct vision, a 10 mm trocar  was placed in the subxiphoid area and two 5 mm trocars along the right  subcostal margin. The gallbladder was visualized.  There were some omental  adhesions that were taken down after grasping the fundus. It appeared  slightly thickened but not acutely inflamed. The infundibulum was exposed  and retracted inferolaterally. The peritoneum anterior to Calot's triangle  was incised, fibrofatty tissue was stripped off the neck of the gallbladder  toward the porta hepatis. The cystic artery was lying in an inferior  anterior position and was divided between two proximal and one distal clip.  The cystic duct was dissected over about a centimeter and the cystic duct  gallbladder junction dissected 360 degrees. When the anatomy was clear, the  cystic duct was clipped at the gallbladder junction, operative cholangiogram  obtained to the cystic duct. This showed good filling of normal common bile  duct and intrahepatic ducts with free flow into the duodenum and no filling  defects. Following this, the cholangiocatheter was removed and the cystic  duct was triply clipped proximally and divided. The gallbladder was then  dissected free from its bed using  hook cautery and removed through the  umbilicus. The right upper quadrant was irrigated, complete hemostasis  assured. Trocars removed under direct vision, all CO2 evacuated from the  peritoneal cavity. The mattress suture  secured to the umbilicus. The skin incisions were closed with interrupted  subcuticular 4-0 Monocryl and Steri-Strips. Sponge, needle and instrument  counts were correct. Dry sterile dressings were applied and the patient  taken to the recovery room in good condition.                                               Lorne Skeens. Hoxworth, M.D.    Tory Emerald  D:  11/05/2002  T:  11/05/2002  Job:  [id number]

## 2010-11-04 ENCOUNTER — Telehealth: Payer: Self-pay | Admitting: Pulmonary Disease

## 2010-11-04 NOTE — Telephone Encounter (Signed)
I spoke with pt and he c/o cough w/ green phlem, wheezing, chest tightness, feels tired x 1 week. Pt has been taking aspirin 1 every 4 hrs and mucinex 1 every 4 hours. Pt denies any fever, nausea, vomiting. Pt is also wanting Dr. Kriste Basque to call him personally. Pt would not elaborate on what and just asked I let Dr. Kriste Basque know he wants to talk with him. Please advise Dr. Kriste Basque. Thanks  Allergies  Allergen Reactions  . Codeine     REACTION: nausea  . Penicillins   . Tetracycline     Carver Fila, CMA

## 2010-11-04 NOTE — Telephone Encounter (Signed)
Called and spoke with pt and he is aware of appt made to see TP in the am for the cough.

## 2010-11-05 ENCOUNTER — Ambulatory Visit (INDEPENDENT_AMBULATORY_CARE_PROVIDER_SITE_OTHER): Payer: 59 | Admitting: Adult Health

## 2010-11-05 ENCOUNTER — Encounter: Payer: Self-pay | Admitting: Adult Health

## 2010-11-05 VITALS — BP 142/90 | HR 59 | Temp 97.2°F | Ht 67.0 in | Wt 229.6 lb

## 2010-11-05 DIAGNOSIS — J209 Acute bronchitis, unspecified: Secondary | ICD-10-CM

## 2010-11-05 MED ORDER — METHYLPHENIDATE HCL 20 MG PO TABS: 20.0000 mg | ORAL_TABLET | Freq: Every day | ORAL | Status: DC

## 2010-11-05 MED ORDER — HYDROCODONE-HOMATROPINE 5-1.5 MG/5ML PO SYRP: 5.0000 mL | ORAL_SOLUTION | Freq: Four times a day (QID) | ORAL | Status: AC | PRN

## 2010-11-05 MED ORDER — CEFDINIR 300 MG PO CAPS: 300.0000 mg | ORAL_CAPSULE | Freq: Two times a day (BID) | ORAL | Status: DC

## 2010-11-05 NOTE — Progress Notes (Signed)
Subjective:    Patient ID: Benjamin Cortez, male    DOB: [birth year], 65 y.o.   MRN: [id number]  HPI 65 yo WM   ~ February 03, 2009: he's had a good yr x episode of gastroenteritis in Sep (wife had salmonella)... notes some reflux & takes OMEP 20mg /d w/ control of symptoms... no other complaints or concerns.   ~ January 29, 2010: he continues to do well- no new complaints or concerns x left hip bursitis & mild lumbar disc degen Rx'd by Alinda Sierras w/ shot, heat, PT, etc & improved... he denies CP, palpit, SOB, etc; BP normal; GERD regulated by Omep OTC; colonoscopy due 2013 & he denies symptoms; he had the 2011 Flu vaccine already...   11/05/2010 ACUTE OV  Complains of cough w/ grey to green phlem, wheezing, chest tightness, increase SOB, chest congestion, feels tired x 2 weeks. Started with head cold symptoms with nasal drip , drainage into throat. OTC not helping. Coughing is keeping him up, feels worn out.  No hemoptysis , fever, n/v/d, leg swelling. No recent travel or abx use.     PMH:   ALLERGIC RHINITIS (ICD-477.9) - on Claritin OTC Prn... prev testing showed mold sensitivity.  ASTHMATIC BRONCHITIS, ACUTE (ICD-466.0) - takes MUCINEX 1-2 Bid Prn & he uses his wife's cough syrup Prn... he would like ZPak for Prn use...  CHEST WALL PAIN, HX OF (ICD-V15.89) - on ASA 325mg /d...  ~ he participated in a [place] research study on atherosclerosis in 2000- as part of the protocol he had:  ** Cardiac CT - calcium score= zero...  ** Carotid Dopplers - negative, no signif blockage...  ** ABI's were normal...  ~ NuclearStressTest 9/04 showed mildly abn EKG but neg images- w/o ischemia or infarct, EF=67%...  GERD (ICD-530.81) - on OMEPRAZOLE 20mg /d (OTC generic med).  ~ last EGD 9/04 by DrDBrodie showed 3cm HH r/o barrett's- bx showed mild inflamm only... subseq had GB removed 9/04 by DrHoxsworth...  COLONIC POLYPS (ICD-211.3)  ~ last colonoscopy 12/03 by DrDBrodie showed 5mm polyp- hyperplastic on bx...  f/u planned 2013.  RENAL CALCULUS (ICD-592.0) - hx kidney stone passed in 1989 & saw DrPeterson in past..  Hx of HEADACHE (ICD-784.0) - prev HA eval by DrLewitt in 2005... Dx w/ migraines w/ visual symptoms... not currently active or requiring meds...  ATTENTION DEFICIT DISORDER, HX OF (ICD-V11.8) - on RITALIN 20mg  Prn (?he uses son's Rx)... prev eval by Psychiatry/ Psychology w/ Adult ADD on Ritalin 20mg /d and doing well... he wants to continue Rx due to work, home life, etc... tolerates med well & denies side effects etc...  ANXIETY (ICD-300.00)     Review of Systems Constitutional:   No  weight loss, night sweats,  Fevers, chills,  +fatigue, or  lassitude.  HEENT:   No headaches,  Difficulty swallowing,  Tooth/dental problems, or  Sore throat,               +, nasal congestion, post nasal drip,   CV:  No chest pain,  Orthopnea, PND, swelling in lower extremities, anasarca, dizziness, palpitations, syncope.   GI  No heartburn, indigestion, abdominal pain, nausea, vomiting, diarrhea, change in bowel habits, loss of appetite, bloody stools.   Resp:       No coughing up of blood.     No wheezing.  No chest wall deformity  Skin: no rash or lesions.  GU: no dysuria, change in color of urine, no urgency or frequency.  No flank pain, no hematuria  MS:  No joint pain or swelling.  No decreased range of motion.  No back pain.  Psych:  No change in mood or affect. No depression or anxiety.  No memory loss.         Objective:   Physical Exam GEN: A/Ox3; pleasant , NAD, well nourished   HEENT:  [place]/AT,  EACs-clear, TMs-wnl, NOSE-clear drainage  THROAT-clear, no lesions, no postnasal drip or exudate noted.   NECK:  Supple w/ fair ROM; no JVD; normal carotid impulses w/o bruits; no thyromegaly or nodules palpated; no lymphadenopathy.  RESP  Coarse BS  w/o, wheezes/ rales/ or rhonchi.no accessory muscle use, no dullness to percussion  CARD:  RRR, no m/r/g  , no peripheral edema, pulses  intact, no cyanosis or clubbing.  GI:   Soft & nt; nml bowel sounds; no organomegaly or masses detected.  Musco: Warm bil, no deformities or joint swelling noted.   Neuro: alert, no focal deficits noted.    Skin: Warm, no lesions or rashes         Assessment & Plan:

## 2010-11-05 NOTE — Patient Instructions (Signed)
Omnicef 300mg Twice daily  For 7 days  Mucinex DM Twice daily  As needed  Cough/congestion  Saline nasal rinses As needed   Hydromet 1-2 tsp every 4-6 hr As needed  Cough/congestion Please contact office for sooner follow up if symptoms do not improve or worsen or seek emergency care   

## 2010-11-05 NOTE — Assessment & Plan Note (Signed)
Omnicef 300mg  Twice daily  For 7 days  Mucinex DM Twice daily  As needed  Cough/congestion  Saline nasal rinses As needed   Hydromet 1-2 tsp every 4-6 hr As needed  Cough/congestion Please contact office for sooner follow up if symptoms do not improve or worsen or seek emergency care

## 2011-01-28 ENCOUNTER — Ambulatory Visit (INDEPENDENT_AMBULATORY_CARE_PROVIDER_SITE_OTHER)
Admission: RE | Admit: 2011-01-28 | Discharge: 2011-01-28 | Disposition: A | Discharge status: Home / Self Care | Payer: 59 | Source: Ambulatory Visit | Attending: Pulmonary Disease | Admitting: Pulmonary Disease

## 2011-01-28 ENCOUNTER — Other Ambulatory Visit (INDEPENDENT_AMBULATORY_CARE_PROVIDER_SITE_OTHER): Payer: 59

## 2011-01-28 ENCOUNTER — Encounter: Payer: Self-pay | Admitting: Pulmonary Disease

## 2011-01-28 ENCOUNTER — Ambulatory Visit (INDEPENDENT_AMBULATORY_CARE_PROVIDER_SITE_OTHER): Payer: 59 | Admitting: Pulmonary Disease

## 2011-01-28 VITALS — BP 132/86 | HR 62 | Temp 98.5°F | Ht 67.0 in | Wt 230.2 lb

## 2011-01-28 DIAGNOSIS — D126 Benign neoplasm of colon, unspecified: Secondary | ICD-10-CM

## 2011-01-28 DIAGNOSIS — Z9189 Other specified personal risk factors, not elsewhere classified: Secondary | ICD-10-CM

## 2011-01-28 DIAGNOSIS — N200 Calculus of kidney: Secondary | ICD-10-CM

## 2011-01-28 DIAGNOSIS — J309 Allergic rhinitis, unspecified: Secondary | ICD-10-CM

## 2011-01-28 DIAGNOSIS — F411 Generalized anxiety disorder: Secondary | ICD-10-CM

## 2011-01-28 DIAGNOSIS — Z23 Encounter for immunization: Secondary | ICD-10-CM

## 2011-01-28 DIAGNOSIS — R51 Headache: Secondary | ICD-10-CM

## 2011-01-28 DIAGNOSIS — K219 Gastro-esophageal reflux disease without esophagitis: Secondary | ICD-10-CM

## 2011-01-28 DIAGNOSIS — Z0000 Encounter for general adult medical examination without abnormal findings: Secondary | ICD-10-CM

## 2011-01-28 DIAGNOSIS — J209 Acute bronchitis, unspecified: Secondary | ICD-10-CM

## 2011-01-28 DIAGNOSIS — Z8659 Personal history of other mental and behavioral disorders: Secondary | ICD-10-CM

## 2011-01-28 LAB — LIPID PANEL
Cholesterol: 183 mg/dL (ref 0–200)
LDL Cholesterol: 120 mg/dL — ABNORMAL HIGH (ref 0–99)
Triglycerides: 63 mg/dL (ref 0.0–149.0)

## 2011-01-28 LAB — CBC WITH DIFFERENTIAL/PLATELET
Basophils Relative: 0.4 % (ref 0.0–3.0)
Eosinophils Relative: 3.1 % (ref 0.0–5.0)
Lymphocytes Relative: 21.3 % (ref 12.0–46.0)
Neutrophils Relative %: 65.5 % (ref 43.0–77.0)
RBC: 4.74 Mil/uL (ref 4.22–5.81)
WBC: 6.4 10*3/uL (ref 4.5–10.5)

## 2011-01-28 LAB — PSA: PSA: 0.92 ng/mL (ref 0.10–4.00)

## 2011-01-28 LAB — BASIC METABOLIC PANEL
BUN: 14 mg/dL (ref 6–23)
CO2: 29 mEq/L (ref 19–32)
Chloride: 108 mEq/L (ref 96–112)
Creatinine, Ser: 1.1 mg/dL (ref 0.4–1.5)

## 2011-01-28 LAB — HEPATIC FUNCTION PANEL
ALT: 36 U/L (ref 0–53)
Albumin: 3.9 g/dL (ref 3.5–5.2)
Total Bilirubin: 0.7 mg/dL (ref 0.3–1.2)
Total Protein: 6.5 g/dL (ref 6.0–8.3)

## 2011-01-28 MED ORDER — METHYLPHENIDATE HCL 20 MG PO TABS: 20.0000 mg | ORAL_TABLET | Freq: Every day | ORAL | Status: DC

## 2011-01-28 NOTE — Progress Notes (Signed)
Subjective:     Patient ID: Benjamin Cortez, male   DOB: 27-Sep-1945, 65 y.o.   MRN: [id number]  HPI 65 y/o WM here for a follow up visit...   ~  February 03, 2009:  he's had a good yr x episode of gastroenteritis in Sep (wife had salmonella)... notes some reflux & takes OMEP 20mg /d w/ control of symptoms... no other complaints or concerns.  ~  January 29, 2010:  he continues to do well- no new complaints or concerns x left hip bursitis & mild lumbar disc degen Rx'd by Alinda Sierras w/ shot, heat, PT, etc & improved...  he denies CP, palpit, SOB, etc;  BP normal;  GERD regulated by Omep OTC;  colonoscopy due 2013 & he denies symptoms;  he had the 2011 Flu vaccine already...  ~  January 28, 2011:  Yearly CPX> Avrey continues to do well overall; CC= bone spur left kneecap eval by Alinda Sierras w/ brace rx, he uses Ibuprofen OTC as needed; he is too sedentary & overwt w/o change> we discussed diet + exercise;  His only prescription medication is ritalin taken for AdultADD;  See prob list below>> Hx AB> no recent resp exacerbations; he uses MUCINEX as needed & likes to keep a ZPak handy for infection... Overweight> he weighs 230#, 67" tall, BMI=36; he is way too sedentary & we discussed the need for diet/ exercise program... GERD> on Prilosec OTC as needed... Hx colon polyps> last colonoscopy was 12/03 by DrDBrodie w/ hyperplastic polyp; his 59yr f/u colon will be due in 2013... Hx HAs> Hx ocular migraines in past w/ eval by DrLewitt; not requiring meds now... ADD> on RITALIN 20mg  prn & doing well he says; he requests to continue same Rx...          Problem List:   ALLERGIC RHINITIS (ICD-477.9) - on Claritin OTC Prn... prev testing showed mold sensitivity.  ASTHMATIC BRONCHITIS, ACUTE (ICD-466.0) - takes MUCINEX 1-2 Bid Prn & he uses his wife's cough syrup Prn... he would like ZPak for Prn use- ok.  CHEST WALL PAIN, HX OF (ICD-V15.89) - on ASA 325mg /d... ~  he participated in a [place] research study on  atherosclerosis in 2000- as part of the protocol he had:      >>Cardiac CT - calcium score= zero...      >>Carotid Dopplers - negative, no signif blockage...      >>ABI's were normal... ~  NuclearStressTest 9/04 showed mildly abn EKG but neg images- w/o ischemia or infarct, EF=67%... ~  EKG 12/12 showed NSR rate=60, NSSTTWA, NAD...  OVERWEIGHT >> we discussed diet + exercise program needed for weight reduction... ~  Weight 12/11 = 228# ~  Weight 12/12 = 230#  GERD (ICD-530.81) - on OMEPRAZOLE 20mg /d (OTC generic med). ~  last EGD 9/04 by DrDBrodie showed 3cm HH r/o barrett's- bx showed mild inflamm only... subseq had GB removed 9/04 by DrHoxsworth...  COLONIC POLYPS (ICD-211.3) ~  last colonoscopy 12/03 by DrDBrodie showed 5mm polyp- hyperplastic on bx... f/u planned 2013.  RENAL CALCULUS (ICD-592.0) - hx kidney stone passed in 1989 & saw DrPeterson in past..  Hx of HEADACHE (ICD-784.0) - prev HA eval by DrLewitt in 2005... Dx w/ migraines w/ visual symptoms... not currently active or requiring meds...  ATTENTION DEFICIT DISORDER, HX OF (ICD-V11.8) - on RITALIN 20mg  Prn (?he uses son's Rx)... prev eval by Psychiatry/ Psychology w/ Adult ADD on Ritalin 20mg /d and doing well... he wants to continue Rx due to work, home life, etc... tolerates med  well & denies side effects etc...  ANXIETY (ICD-300.00)   Past Surgical History  Procedure Date  . Appendectomy   . Cholecystectomy     Outpatient Encounter Prescriptions as of 01/28/2011  Medication Sig Dispense Refill  . aspirin 325 MG tablet Take 325 mg by mouth daily.        Marland Kitchen guaiFENesin (MUCINEX) 600 MG 12 hr tablet Take 1,200 mg by mouth 2 (two) times daily as needed.       . loratadine (CLARITIN) 10 MG tablet Take 10 mg by mouth daily.        . methylphenidate (RITALIN) 20 MG tablet Take 1 tablet (20 mg total) by mouth daily.  30 tablet  0  . omeprazole (PRILOSEC) 20 MG capsule Take 20 mg by mouth daily.          Allergies    Allergen Reactions  . Codeine     REACTION: nausea  . Penicillins     As a child--rash  . Tetracycline     Rash, blisters    Current Medications, Allergies, Past Medical History, Past Surgical History, Family History, and Social History were reviewed in [employer] record.   Review of Systems         See HPI - all other systems neg except as noted...  The patient denies anorexia, fever, weight loss, weight gain, vision loss, decreased hearing, hoarseness, chest pain, syncope, dyspnea on exertion, peripheral edema, prolonged cough, headaches, hemoptysis, abdominal pain, melena, hematochezia, severe indigestion/heartburn, hematuria, incontinence, muscle weakness, suspicious skin lesions, transient blindness, difficulty walking, depression, unusual weight change, abnormal bleeding, enlarged lymph nodes, and angioedema.     Objective:   Physical Exam     WD, WN, 65 y/o WM in NAD... GENERAL:  Alert & oriented; pleasant & cooperative... HEENT:  [place]/AT, EOM-wnl, PERRLA, Fundi-benign, EACs-clear, TMs-wnl, NOSE-clear, THROAT-clear & wnl. NECK:  Supple w/ full ROM; no JVD; normal carotid impulses w/o bruits; no thyromegaly or nodules palpated; no lymphadenopathy. CHEST:  Clear to P & A; without wheezes/ rales/ or rhonchi. HEART:  Regular Rhythm; without murmurs/ rubs/ or gallops. ABDOMEN:  Obese, soft & nontender; normal bowel sounds; no organomegaly or masses detected, no guarding or rebound or bruits. RECTAL:  Prostate is 2+ normal w/o nodules; stool heme neg... EXT: without deformities or arthritic changes; no varicose veins/ venous insuffic/ or edema. NEURO:  CN's intact; motor testing normal; sensory testing normal; gait normal & balance OK. DERM:  No lesions noted; no rash etc...  RADIOLOGY DATA:  Reviewed in the EPIC EMR & discussed w/ the patient...    >>CXR 12/12 showed clear lungs, normal heart size & config, NAD...    >>EKG 12/12 showed NSR rate=60,  NSSTTWA, NAD...  LABORATORY DATA:  Reviewed in the EPIC EMR & discussed w/ the patient...    >>LABS generally WNL x FBS=108, LDL=120; Rec> diet rx, get wt down...   Assessment:     AR, AB>  He denies exacerbations; doing satis on OTC meds prn...  Overweight>  His BMI=36 & he is way too sedentary; rec for incr exercise + diet efforts & wt reduction...  GERD>  Controlled on OTC Prilosec...  Hx colon polyps>  Up to date w/ f/u colon due 12/13 by DrDBrodie...  Hx kidney stones>  No recurrence...  Hx HAs>  No recurrence in his ocular migraines...  Adult ADD>  Stable on prn Ritalin 20mg  & he wishes to continue the same...     Plan:  Patient's Medications  New Prescriptions   No medications on file  Previous Medications   ASPIRIN 325 MG TABLET    Take 325 mg by mouth daily.     GUAIFENESIN (MUCINEX) 600 MG 12 HR TABLET    Take 1,200 mg by mouth 2 (two) times daily as needed.    LORATADINE (CLARITIN) 10 MG TABLET    Take 10 mg by mouth daily.     METHYLPHENIDATE (RITALIN) 20 MG TABLET    Take 1 tablet (20 mg total) by mouth daily.   OMEPRAZOLE (PRILOSEC) 20 MG CAPSULE    Take 20 mg by mouth daily.    Modified Medications   No medications on file  Discontinued Medications   No medications on file

## 2011-01-28 NOTE — Patient Instructions (Signed)
Today we updated your med list in our EPIC system...    Continue your current medications the same...    We refilled your Ritalin per request...  Today we did your follow up CXR & fasting blood work...    Please call the [facility]REE in a few days for your results...    Dial [id number] & when prompted enter your patient number followed by the # symbol...    Your patient number is:  [id number]#  Let's get on track w/ our diet & exercise program...  Call for any problems...  Let's plan a follow up physical in 46yr.Marland KitchenMarland Kitchen

## 2011-01-29 ENCOUNTER — Encounter: Payer: Self-pay | Admitting: Pulmonary Disease

## 2011-10-11 ENCOUNTER — Encounter: Payer: Self-pay | Admitting: Internal Medicine

## 2011-10-17 ENCOUNTER — Ambulatory Visit (INDEPENDENT_AMBULATORY_CARE_PROVIDER_SITE_OTHER): Payer: 59 | Admitting: Adult Health

## 2011-10-17 ENCOUNTER — Encounter: Payer: Self-pay | Admitting: Adult Health

## 2011-10-17 ENCOUNTER — Other Ambulatory Visit (INDEPENDENT_AMBULATORY_CARE_PROVIDER_SITE_OTHER): Payer: 59

## 2011-10-17 VITALS — BP 142/86 | HR 53 | Temp 98.1°F | Ht 67.0 in | Wt 222.2 lb

## 2011-10-17 DIAGNOSIS — R5381 Other malaise: Secondary | ICD-10-CM

## 2011-10-17 DIAGNOSIS — D126 Benign neoplasm of colon, unspecified: Secondary | ICD-10-CM

## 2011-10-17 DIAGNOSIS — R5383 Other fatigue: Secondary | ICD-10-CM

## 2011-10-17 DIAGNOSIS — F411 Generalized anxiety disorder: Secondary | ICD-10-CM

## 2011-10-17 LAB — BASIC METABOLIC PANEL
CO2: 28 mEq/L (ref 19–32)
Chloride: 106 mEq/L (ref 96–112)
Glucose, Bld: 99 mg/dL (ref 70–99)
Sodium: 142 mEq/L (ref 135–145)

## 2011-10-17 LAB — CBC WITH DIFFERENTIAL/PLATELET
Basophils Absolute: 0 10*3/uL (ref 0.0–0.1)
Eosinophils Absolute: 0.2 10*3/uL (ref 0.0–0.7)
Eosinophils Relative: 2.8 % (ref 0.0–5.0)
Hemoglobin: 15.4 g/dL (ref 13.0–17.0)
Lymphocytes Relative: 25.2 % (ref 12.0–46.0)
Monocytes Relative: 10.4 % (ref 3.0–12.0)
Neutro Abs: 3.2 10*3/uL (ref 1.4–7.7)
Platelets: 220 10*3/uL (ref 150.0–400.0)
RDW: 13.1 % (ref 11.5–14.6)
WBC: 5.3 10*3/uL (ref 4.5–10.5)

## 2011-10-17 MED ORDER — SERTRALINE HCL 50 MG PO TABS: 50.0000 mg | ORAL_TABLET | Freq: Every day | ORAL | Status: DC

## 2011-10-17 NOTE — Patient Instructions (Addendum)
Begin Zoloft 50mg  daily  Stress reducers with exercise as tolerated.  Call back if you change your mind regarding counseling referral  We are referring you for routine colonoscopy  I will call with lab results.  follow up Dr. Kriste Basque  In 4 weeks and As needed   Please contact office for sooner follow up if symptoms do not improve or worsen or seek emergency care

## 2011-10-17 NOTE — Progress Notes (Signed)
Subjective:     Patient ID: Benjamin Cortez, male   DOB: 09-15-1945, 66 y.o.   MRN: [id number]  HPI  66 y/o WM here for a follow up visit...   ~  February 03, 2009:  he's had a good yr x episode of gastroenteritis in Sep (wife had salmonella)... notes some reflux & takes OMEP 20mg /d w/ control of symptoms... no other complaints or concerns.  ~  January 29, 2010:  he continues to do well- no new complaints or concerns x left hip bursitis & mild lumbar disc degen Rx'd by Alinda Sierras w/ shot, heat, PT, etc & improved...  he denies CP, palpit, SOB, etc;  BP normal;  GERD regulated by Omep OTC;  colonoscopy due 2013 & he denies symptoms;  he had the 2011 Flu vaccine already...  ~  January 28, 2011:  Yearly CPX> Benjamin Cortez continues to do well overall; CC= bone spur left kneecap eval by Alinda Sierras w/ brace rx, he uses Ibuprofen OTC as needed; he is too sedentary & overwt w/o change> we discussed diet + exercise;  His only prescription medication is ritalin taken for AdultADD;  See prob list below>> Hx AB> no recent resp exacerbations; he uses MUCINEX as needed & likes to keep a ZPak handy for infection... Overweight> he weighs 230#, 67" tall, BMI=36; he is way too sedentary & we discussed the need for diet/ exercise program... GERD> on Prilosec OTC as needed... Hx colon polyps> last colonoscopy was 12/03 by DrDBrodie w/ hyperplastic polyp; his 70yr f/u colon will be due in 2013... Hx HAs> Hx ocular migraines in past w/ eval by DrLewitt; not requiring meds now... ADD> on RITALIN 20mg  prn & doing well he says; he requests to continue same Rx...   10/17/2011 Acute OV  Complains over last few months, feels worn out, no energy.  Stressed at work with deadlines.  More headaches than usual.  Feels anxious a lot .  "Can't get it all done" "Not enough time in the day" .  Only taking Ritalin intermittently. Does not take this on weekends.  Wife not working , just had hand surgery.  Wants to retire but cant. Does not  want to work.  No exertional chest pain, weight loss. No night sweats No abdominal pian .  Not exercising.  Involved in a lot of things including church, kids/grandkids No suicidal ideations.  Trouble sleeping at times. -worrying a lot .   Due for his colonoscopy, wants [place] to send routine referral .            Problem List:   ALLERGIC RHINITIS (ICD-477.9) - on Claritin OTC Prn... prev testing showed mold sensitivity.  ASTHMATIC BRONCHITIS, ACUTE (ICD-466.0) - takes MUCINEX 1-2 Bid Prn & he uses his wife's cough syrup Prn... he would like ZPak for Prn use- ok.  CHEST WALL PAIN, HX OF (ICD-V15.89) - on ASA 325mg /d... ~  he participated in a [place] research study on atherosclerosis in 2000- as part of the protocol he had:      >>Cardiac CT - calcium score= zero...      >>Carotid Dopplers - negative, no signif blockage...      >>ABI's were normal... ~  NuclearStressTest 9/04 showed mildly abn EKG but neg images- w/o ischemia or infarct, EF=67%... ~  EKG 12/12 showed NSR rate=60, NSSTTWA, NAD...  OVERWEIGHT >> we discussed diet + exercise program needed for weight reduction... ~  Weight 12/11 = 228# ~  Weight 12/12 = 230#  GERD (ICD-530.81) - on OMEPRAZOLE  20mg /d (OTC generic med). ~  last EGD 9/04 by DrDBrodie showed 3cm HH r/o barrett's- bx showed mild inflamm only... subseq had GB removed 9/04 by DrHoxsworth...  COLONIC POLYPS (ICD-211.3) ~  last colonoscopy 12/03 by DrDBrodie showed 5mm polyp- hyperplastic on bx... f/u planned 2013.  RENAL CALCULUS (ICD-592.0) - hx kidney stone passed in 1989 & saw DrPeterson in past..  Hx of HEADACHE (ICD-784.0) - prev HA eval by DrLewitt in 2005... Dx w/ migraines w/ visual symptoms... not currently active or requiring meds...  ATTENTION DEFICIT DISORDER, HX OF (ICD-V11.8) - on RITALIN 20mg  Prn (?he uses son's Rx)... prev eval by Psychiatry/ Psychology w/ Adult ADD on Ritalin 20mg /d and doing well... he wants to continue Rx due to work, home  life, etc... tolerates med well & denies side effects etc...  ANXIETY (ICD-300.00)   Past Surgical History  Procedure Date  . Appendectomy   . Cholecystectomy     Outpatient Encounter Prescriptions as of 10/17/2011  Medication Sig Dispense Refill  . loratadine (CLARITIN) 10 MG tablet Take 10 mg by mouth daily.        . methylphenidate (RITALIN) 20 MG tablet Take 1 tablet (20 mg total) by mouth daily.  30 tablet  0  . Multiple Vitamin (MULTIVITAMIN) tablet Take 1 tablet by mouth daily.      Marland Kitchen omeprazole (PRILOSEC) 20 MG capsule Take 20 mg by mouth daily.        Marland Kitchen aspirin 325 MG tablet Take 325 mg by mouth daily.        Marland Kitchen guaiFENesin (MUCINEX) 600 MG 12 hr tablet Take 1,200 mg by mouth 2 (two) times daily as needed.         Allergies  Allergen Reactions  . Codeine     REACTION: nausea  . Penicillins     As a child--rash  . Tetracycline     Rash, blisters    Current Medications, Allergies, Past Medical History, Past Surgical History, Family History, and Social History were reviewed in [employer] record.   Review of Systems Constitutional:   No  weight loss, night sweats,  Fevers, chills, ++ fatigue, or  lassitude.  HEENT:   No headaches,  Difficulty swallowing,  Tooth/dental problems, or  Sore throat,                No sneezing, itching, ear ache, nasal congestion, post nasal drip,   CV:  No chest pain,  Orthopnea, PND, swelling in lower extremities, anasarca, dizziness, palpitations, syncope.   GI  No heartburn, indigestion, abdominal pain, nausea, vomiting, diarrhea, change in bowel habits, loss of appetite, bloody stools.   Resp: No shortness of breath with exertion or at rest.  No excess mucus, no productive cough,  No non-productive cough,  No coughing up of blood.  No change in color of mucus.  No wheezing.  No chest wall deformity  Skin: no rash or lesions.  GU: no dysuria, change in color of urine, no urgency or frequency.  No flank pain, no  hematuria   MS:  No joint pain or swelling.  No decreased range of motion.  No back pain.  Psych:  +change in mood  ++depression or anxiety.  No memory loss.             Objective:   Physical Exam      WD, WN, 66 y/o WM in NAD... GENERAL:  Alert & oriented; pleasant & cooperative... HEENT:  [place]/AT, EOM-wnl, PERRLA, Fundi-benign, EACs-clear, TMs-wnl,  NOSE-clear, THROAT-clear & wnl. NECK:  Supple w/ full ROM; no JVD; normal carotid impulses w/o bruits; no thyromegaly or nodules palpated; no lymphadenopathy. CHEST:  Clear to P & A; without wheezes/ rales/ or rhonchi. HEART:  Regular Rhythm; without murmurs/ rubs/ or gallops. ABDOMEN:  Obese, soft & nontender; normal bowel sounds; no organomegaly or masses detected, no guarding or rebound or bruits.  EXT: without deformities or arthritic changes; no varicose veins/ venous insuffic/ or edema. NEURO:  CN's intact; motor testing normal; sensory testing normal; gait normal & balance OK. DERM:  No lesions noted; no rash etc...  RADIOLOGY DATA:  Reviewed in the EPIC EMR & discussed w/ the patient...    >>CXR 12/12 showed clear lungs, normal heart size & config, NAD...    >>EKG 12/12 showed NSR rate=60, NSSTTWA, NAD...  LABORATORY DATA:  Reviewed in the EPIC EMR & discussed w/ the patient...    >>LABS generally WNL x FBS=108, LDL=120; Rec> diet rx, get wt down...   Assessment:     Plan:

## 2011-10-18 LAB — TESTOSTERONE: Testosterone: 439.57 ng/dL (ref 350.00–890.00)

## 2011-10-18 NOTE — Assessment & Plan Note (Signed)
Refer for routine screening colon to GI

## 2011-10-18 NOTE — Assessment & Plan Note (Signed)
Anxiety complicated by depression not controlled at present  Discussed need for referral to counseling esp with underlying ADD  He declines. -have recommend reconsideration along with stress reducers such as walking/exercise/lists, etc  Check labs to r/o underlying electrolyte or other abn  Plan  'Begin Zoloft 50mg  daily  Stress reducers with exercise as tolerated.  Call back if you change your mind regarding counseling referral    I will call with lab results.  follow up Dr. Kriste Basque  In 4 weeks and As needed   Please contact office for sooner follow up if symptoms do not improve or worsen or seek emergency care

## 2011-10-19 ENCOUNTER — Ambulatory Visit (AMBULATORY_SURGERY_CENTER): Payer: 59 | Admitting: *Deleted

## 2011-10-19 ENCOUNTER — Encounter: Payer: Self-pay | Admitting: Internal Medicine

## 2011-10-19 VITALS — Ht 68.0 in | Wt 223.1 lb

## 2011-10-19 DIAGNOSIS — Z1211 Encounter for screening for malignant neoplasm of colon: Secondary | ICD-10-CM

## 2011-10-19 MED ORDER — MOVIPREP 100 G PO SOLR: ORAL | Status: DC

## 2011-11-02 ENCOUNTER — Encounter: Payer: 59 | Admitting: Internal Medicine

## 2011-11-11 ENCOUNTER — Encounter: Payer: Self-pay | Admitting: *Deleted

## 2011-11-14 ENCOUNTER — Encounter: Payer: Self-pay | Admitting: Pulmonary Disease

## 2011-11-14 ENCOUNTER — Ambulatory Visit (INDEPENDENT_AMBULATORY_CARE_PROVIDER_SITE_OTHER): Payer: 59 | Admitting: Pulmonary Disease

## 2011-11-14 VITALS — BP 142/88 | HR 57 | Temp 97.9°F | Ht 67.0 in | Wt 217.4 lb

## 2011-11-14 DIAGNOSIS — Z23 Encounter for immunization: Secondary | ICD-10-CM

## 2011-11-14 DIAGNOSIS — Z8659 Personal history of other mental and behavioral disorders: Secondary | ICD-10-CM

## 2011-11-14 DIAGNOSIS — R5381 Other malaise: Secondary | ICD-10-CM

## 2011-11-14 DIAGNOSIS — R5383 Other fatigue: Secondary | ICD-10-CM

## 2011-11-14 DIAGNOSIS — K219 Gastro-esophageal reflux disease without esophagitis: Secondary | ICD-10-CM

## 2011-11-14 DIAGNOSIS — F411 Generalized anxiety disorder: Secondary | ICD-10-CM

## 2011-11-14 DIAGNOSIS — R51 Headache: Secondary | ICD-10-CM

## 2011-11-14 DIAGNOSIS — J209 Acute bronchitis, unspecified: Secondary | ICD-10-CM

## 2011-11-14 NOTE — Patient Instructions (Addendum)
Today we updated your med list in our EPIC system...    Continue your current medications the same...  We gave you the 2013 Flu vaccine today...  Call for any problems...  Let's plan to complete the annual Physical in Dec w/ a fasting lipid profile, CXR, EKG.Marland KitchenMarland Kitchen

## 2011-11-14 NOTE — Progress Notes (Signed)
Subjective:     Patient ID: Benjamin Cortez, male   DOB: 17-May-1945, 66 y.o.   MRN: [id number]  HPI 66 y/o WM here for a follow up visit...   ~  February 03, 2009:  he's had a good yr x episode of gastroenteritis in Sep (wife had salmonella)... notes some reflux & takes OMEP 20mg /d w/ control of symptoms... no other complaints or concerns.  ~  January 29, 2010:  he continues to do well- no new complaints or concerns x left hip bursitis & mild lumbar disc degen Rx'd by Alinda Sierras w/ shot, heat, PT, etc & improved...  he denies CP, palpit, SOB, etc;  BP normal;  GERD regulated by Omep OTC;  colonoscopy due 2013 & he denies symptoms;  he had the 2011 Flu vaccine already...  ~  January 28, 2011:  Yearly CPX> Benjamin Cortez continues to do well overall; CC= bone spur left kneecap eval by Alinda Sierras w/ brace rx, he uses Ibuprofen OTC as needed; he is too sedentary & overwt w/o change> we discussed diet + exercise;  His only prescription medication is ritalin taken for AdultADD;  See prob list below>>    Hx AB> no recent resp exacerbations; he uses MUCINEX as needed & likes to keep a ZPak handy for infection...    Overweight> he weighs 230#, 67" tall, BMI=36; he is way too sedentary & we discussed the need for diet/ exercise program...    GERD> on Prilosec OTC as needed...    Hx colon polyps> last colonoscopy was 12/03 by DrDBrodie w/ hyperplastic polyp; his 49yr f/u colon will be due in 2013...    Hx HAs> Hx ocular migraines in past w/ eval by DrLewitt; not requiring meds now...    ADD> on RITALIN 20mg  prn & doing well he says; he requests to continue same Rx...  ~  November 14, 2011:  36mo ROV & Manraj saw TP 9/13 c/o feeling tired, no energy, but stressed at work & more HAs than usual, wants to retire but can't, family issues, etc;  He was given ZOLOFT50 & asked to exercise etc;  Now notes feeling better on the Zoloft, sl dry mouth, not using as much ritalin...    Hx AB> no recent resp exacerbations; he uses MUCINEX &  Claritin as needed & likes to keep a ZPak handy for infection...    Overweight> his wt is down 13# to 217# but he is still way too sedentary & we discussed the need for diet/ exercise program...    GERD> on Prilosec OTC as needed...    Hx colon polyps> last colonoscopy was 12/03 by DrDBrodie w/ hyperplastic polyp; his 59yr f/u colon will be due in 2013 (he has f/u appt soon)...    Hx HAs> Hx ocular migraines in past w/ eval by DrLewitt; not requiring meds now...    ADD> on RITALIN 20mg  prn & doing well he says; he requests to continue same Rx...    Situational Depression> doing better w/ the added Zoloft50 & rec to continue the same.. We reviewed prob list, meds, xrays and labs> see below for updates >> OK Flu shot today... LABS 9/13:  Chems- ok w/ K=5.3;  CBC- ok w/ Hg=15.4;  TSH=1.05;  Testos=440           Problem List:   ALLERGIC RHINITIS (ICD-477.9) - on Claritin OTC Prn... prev testing showed mold sensitivity.  ASTHMATIC BRONCHITIS, ACUTE (ICD-466.0) - takes MUCINEX 1-2 Bid Prn & he uses his wife's cough syrup Prn.Marland KitchenMarland Kitchen  he would like ZPak for Prn use- ok. ~  CXR 12/12 showed norm heart size, clear lungs, NAD...  CHEST WALL PAIN, HX OF (ICD-V15.89) - on ASA 325mg /d... ~  he participated in a [place] research study on atherosclerosis in 2000- as part of the protocol he had:      >>Cardiac CT - calcium score= zero...      >>Carotid Dopplers - negative, no signif blockage...      >>ABI's were normal... ~  NuclearStressTest 9/04 showed mildly abn EKG but neg images- w/o ischemia or infarct, EF=67%... ~  EKG 12/12 showed NSR rate=60, NSSTTWA, NAD...  OVERWEIGHT >> we discussed diet + exercise program needed for weight reduction... ~  Weight 12/11 = 228# ~  Weight 12/12 = 230# ~  Weight 10/13 = 217#  GERD (ICD-530.81) - on OMEPRAZOLE 20mg /d (OTC generic med). ~  last EGD 9/04 by DrDBrodie showed 3cm HH r/o barrett's- bx showed mild inflamm only... subseq had GB removed 9/04 by  DrHoxsworth...  COLONIC POLYPS (ICD-211.3) ~  last colonoscopy 12/03 by DrDBrodie showed 5mm polyp- hyperplastic on bx... f/u planned 2013.  RENAL CALCULUS (ICD-592.0) - hx kidney stone passed in 1989 & saw DrPeterson in past..  Hx of HEADACHE (ICD-784.0) - prev HA eval by DrLewitt in 2005... Dx w/ migraines w/ visual symptoms... not currently active or requiring meds...  ATTENTION DEFICIT DISORDER, HX OF (ICD-V11.8) - on RITALIN 20mg  Prn (?he uses son's Rx)... prev eval by Psychiatry/ Psychology w/ Adult ADD on Ritalin 20mg /d and doing well... he wants to continue Rx due to work, home life, etc... tolerates med well & denies side effects etc...  ANXIETY/ DEPRESSION >> started on ZOLOFT 50mg /d 9/13 & he reports feeling better & wants to continue this med...   Past Surgical History  Procedure Date  . Appendectomy 1959  . Cholecystectomy 2005  . Tonsillectomy 1959    Outpatient Encounter Prescriptions as of 11/14/2011  Medication Sig Dispense Refill  . aspirin 325 MG tablet Take 325 mg by mouth daily.        Marland Kitchen guaiFENesin (MUCINEX) 600 MG 12 hr tablet Take 1,200 mg by mouth 2 (two) times daily as needed.       . loratadine (CLARITIN) 10 MG tablet Take 10 mg by mouth daily.        . methylphenidate (RITALIN) 20 MG tablet Take 1 tablet (20 mg total) by mouth daily.  30 tablet  0  . Multiple Vitamin (MULTIVITAMIN) tablet Take 1 tablet by mouth daily.      Marland Kitchen omeprazole (PRILOSEC) 20 MG capsule Take 20 mg by mouth daily.        . sertraline (ZOLOFT) 50 MG tablet Take 1 tablet (50 mg total) by mouth daily.  30 tablet  5  . MOVIPREP 100 G SOLR moviprep as directed. No substitutions  1 kit  0    Allergies  Allergen Reactions  . Codeine     REACTION: nausea  . Penicillins     As a child--rash  . Tetracycline     Rash, blisters    Current Medications, Allergies, Past Medical History, Past Surgical History, Family History, and Social History were reviewed in [employer] record.   Review of Systems         See HPI - all other systems neg except as noted...  The patient denies anorexia, fever, weight loss, weight gain, vision loss, decreased hearing, hoarseness, chest pain, syncope, dyspnea on exertion, peripheral edema, prolonged cough,  headaches, hemoptysis, abdominal pain, melena, hematochezia, severe indigestion/heartburn, hematuria, incontinence, muscle weakness, suspicious skin lesions, transient blindness, difficulty walking, depression, unusual weight change, abnormal bleeding, enlarged lymph nodes, and angioedema.     Objective:   Physical Exam     WD, WN, 66 y/o WM in NAD... GENERAL:  Alert & oriented; pleasant & cooperative... HEENT:  [place]/AT, EOM-wnl, PERRLA, Fundi-benign, EACs-clear, TMs-wnl, NOSE-clear, THROAT-clear & wnl. NECK:  Supple w/ full ROM; no JVD; normal carotid impulses w/o bruits; no thyromegaly or nodules palpated; no lymphadenopathy. CHEST:  Clear to P & A; without wheezes/ rales/ or rhonchi. HEART:  Regular Rhythm; without murmurs/ rubs/ or gallops. ABDOMEN:  Obese, soft & nontender; normal bowel sounds; no organomegaly or masses detected, no guarding or rebound or bruits. RECTAL:  Prostate is 2+ normal w/o nodules; stool heme neg... EXT: without deformities or arthritic changes; no varicose veins/ venous insuffic/ or edema. NEURO:  CN's intact; motor testing normal; sensory testing normal; gait normal & balance OK. DERM:  No lesions noted; no rash etc...  RADIOLOGY DATA:  Reviewed in the EPIC EMR & discussed w/ the patient...   LABORATORY DATA:  Reviewed in the EPIC EMR & discussed w/ the patient...      Assessment:      AR, AB>  He denies exacerbations; doing satis on OTC meds prn...  Overweight>  His BMI is in the mid30s & he is way too sedentary; rec for incr exercise + diet efforts & wt reduction...  GERD>  Controlled on OTC Prilosec...  Hx colon polyps>  Up to date w/ f/u colon due 12/13 by  DrDBrodie...  Hx kidney stones>  No recurrence...  Hx HAs>  No recurrence in his ocular migraines...  Adult ADD>  Stable on prn Ritalin 20mg  & he wishes to continue the same...  Anxiety/ Depression>  Improved on the Zoloft Rx- continue 50mg /d...     Plan:     Patient's Medications  New Prescriptions   No medications on file  Previous Medications   ASPIRIN 325 MG TABLET    Take 325 mg by mouth daily.     GUAIFENESIN (MUCINEX) 600 MG 12 HR TABLET    Take 1,200 mg by mouth 2 (two) times daily as needed.    LORATADINE (CLARITIN) 10 MG TABLET    Take 10 mg by mouth daily.     METHYLPHENIDATE (RITALIN) 20 MG TABLET    Take 1 tablet (20 mg total) by mouth daily.   MULTIPLE VITAMIN (MULTIVITAMIN) TABLET    Take 1 tablet by mouth daily.   OMEPRAZOLE (PRILOSEC) 20 MG CAPSULE    Take 20 mg by mouth daily.     SERTRALINE (ZOLOFT) 50 MG TABLET    Take 1 tablet (50 mg total) by mouth daily.  Modified Medications   No medications on file  Discontinued Medications   MOVIPREP 100 G SOLR    moviprep as directed. No substitutions

## 2011-11-23 ENCOUNTER — Ambulatory Visit (AMBULATORY_SURGERY_CENTER): Payer: 59 | Admitting: Internal Medicine

## 2011-11-23 ENCOUNTER — Encounter: Payer: Self-pay | Admitting: Internal Medicine

## 2011-11-23 VITALS — BP 137/76 | HR 57 | Temp 96.8°F | Resp 17 | Ht 68.0 in | Wt 223.0 lb

## 2011-11-23 DIAGNOSIS — D126 Benign neoplasm of colon, unspecified: Secondary | ICD-10-CM

## 2011-11-23 DIAGNOSIS — K2270 Barrett's esophagus without dysplasia: Secondary | ICD-10-CM

## 2011-11-23 DIAGNOSIS — K2960 Other gastritis without bleeding: Secondary | ICD-10-CM

## 2011-11-23 DIAGNOSIS — Z1211 Encounter for screening for malignant neoplasm of colon: Secondary | ICD-10-CM

## 2011-11-23 MED ORDER — SODIUM CHLORIDE 0.9 % IV SOLN: 500.0000 mL | INTRAVENOUS | Status: DC

## 2011-11-23 NOTE — Progress Notes (Signed)
Patient did not experience any of the following events: a burn prior to discharge; a fall within the facility; wrong site/side/patient/procedure/implant event; or a hospital transfer or hospital admission upon discharge from the facility. (G8907) Patient did not have preoperative order for IV antibiotic SSI prophylaxis. (G8918)  

## 2011-11-23 NOTE — Patient Instructions (Addendum)
Biopsies taken today. Resume current medications. Repeat colonoscopy in 5 years, repeat endoscopy in 2 years. Try to follow high fiber diet. Reflux handout and high fiber diet handout given today. Thank you!!  YOU HAD AN ENDOSCOPIC PROCEDURE TODAY AT THE [facility]: Refer to the procedure report that was given to you for any specific questions about what was found during the examination.  If the procedure report does not answer your questions, please call your gastroenterologist to clarify.  If you requested that your care partner not be given the details of your procedure findings, then the procedure report has been included in a sealed envelope for you to review at your convenience later.  YOU SHOULD EXPECT: Some feelings of bloating in the abdomen. Passage of more gas than usual.  Walking can help get rid of the air that was put into your GI tract during the procedure and reduce the bloating. If you had a lower endoscopy (such as a colonoscopy or flexible sigmoidoscopy) you may notice spotting of blood in your stool or on the toilet paper. If you underwent a bowel prep for your procedure, then you may not have a normal bowel movement for a few days.  DIET: Your first meal following the procedure should be a light meal and then it is ok to progress to your normal diet.  A half-sandwich or bowl of soup is an example of a good first meal.  Heavy or fried foods are harder to digest and may make you feel nauseous or bloated.  Likewise meals heavy in dairy and vegetables can cause extra gas to form and this can also increase the bloating.  Drink plenty of fluids but you should avoid alcoholic beverages for 24 hours.  ACTIVITY: Your care partner should take you home directly after the procedure.  You should plan to take it easy, moving slowly for the rest of the day.  You can resume normal activity the day after the procedure however you should NOT DRIVE or use heavy machinery for 24 hours (because  of the sedation medicines used during the test).    SYMPTOMS TO REPORT IMMEDIATELY: A gastroenterologist can be reached at any hour.  During normal business hours, 8:30 AM to 5:00 PM Monday through Friday, call [phone].  After hours and on weekends, please call the GI answering service at [phone] who will take a message and have the physician on call contact you.   Following lower endoscopy (colonoscopy or flexible sigmoidoscopy):  Excessive amounts of blood in the stool  Significant tenderness or worsening of abdominal pains  Swelling of the abdomen that is new, acute  Fever of 100F or higher  Following upper endoscopy (EGD)  Vomiting of blood or coffee ground material  New chest pain or pain under the shoulder blades  Painful or persistently difficult swallowing  New shortness of breath  Fever of 100F or higher  Black, tarry-looking stools  FOLLOW UP: If any biopsies were taken you will be contacted by phone or by letter within the next 1-3 weeks.  Call your gastroenterologist if you have not heard about the biopsies in 3 weeks.  Our staff will call the home number listed on your records the next business day following your procedure to check on you and address any questions or concerns that you may have at that time regarding the information given to you following your procedure. This is a courtesy call and so if there is no answer at the home  number and we have not heard from you through the emergency physician on call, we will assume that you have returned to your regular daily activities without incident.  SIGNATURES/CONFIDENTIALITY: You and/or your care partner have signed paperwork which will be entered into your electronic medical record.  These signatures attest to the fact that that the information above on your After Visit Summary has been reviewed and is understood.  Full responsibility of the confidentiality of this discharge information lies with you and/or  your care-partner.

## 2011-11-23 NOTE — Op Note (Signed)
[facility] [address] [place], [address]   ENDOSCOPY PROCEDURE REPORT  PATIENT: Benjamin Cortez, Benjamin Cortez  MR#: [id number] BIRTHDATE: February 12, 1945 , 65  yrs. old GENDER: Male ENDOSCOPIST: Hart Carwin, MD REFERRED BY:  Alroy Dust, M.D. PROCEDURE DATE:  11/23/2011 PROCEDURE:  EGD w/ biopsy ASA CLASS:     Class II INDICATIONS:  history of Barrett's esophagus.   EGD [phone]- Barrett's esophagus,. MEDICATIONS: MAC sedation, administered by CRNA and Propofol (Diprivan) 150 mg IV TOPICAL ANESTHETIC: none  DESCRIPTION OF PROCEDURE: After the risks benefits and alternatives of the procedure were thoroughly explained, informed consent was obtained.  The LB [id number] endoscope was introduced through the mouth and advanced to the second portion of the duodenum. Without limitations.  The instrument was slowly withdrawn as the mucosa was fully examined.      ESOPHAGUS: There was evidence of suspected Barrett's esophagus at the gastroesophageal junction.  A biopsy was performed using cold forceps.  Sample obtained to rule out Barrett's esophagus. No stricture  Retroflexed views revealed no abnormalities.     The scope was then withdrawn from the patient and the procedure completed.  COMPLICATIONS: There were no complications. ENDOSCOPIC IMPRESSION: There was evidence of suspected Barrett's esophagus; biopsy to r/o metaplasia  RECOMMENDATIONS: 1.  Await pathology results 2.  anti-reflux regimen to be follow 3.  continue PPI 4.  Proceed with a Colonoscopy.  REPEAT EXAM: In 2 year(s)  for EGD pending biopsy results.  eSigned:  Hart Carwin, MD 11/23/2011 3:10 PM   CC:

## 2011-11-23 NOTE — Op Note (Signed)
[facility] [address] [place], [address]   COLONOSCOPY PROCEDURE REPORT  PATIENT: [name], [name]  MR#: [id number] BIRTHDATE: February 11, 1945 , 65  yrs. old GENDER: Male ENDOSCOPIST: Hart Carwin, MD REFERRED BY:  recall colon PROCEDURE DATE:  11/23/2011 PROCEDURE:   Colonoscopy with snare polypectomy and Colonoscopy with cold biopsy polypectomy ASA CLASS:   Class II INDICATIONS:average risk patient for colon cancer and last colon 2003. MEDICATIONS: MAC sedation, administered by CRNA and Propofol (Diprivan) 120 mg IV  DESCRIPTION OF PROCEDURE:   After the risks and benefits and of the procedure were explained, informed consent was obtained.  A digital rectal exam revealed no abnormalities of the rectum.    The LB [id number]  endoscope was introduced through the anus and advanced to the cecum, which was identified by both the appendix and ileocecal valve .  The quality of the prep was good, using MoviPrep .  The instrument was then slowly withdrawn as the colon was fully examined.     COLON FINDINGS: Two sessile polyps ranging between 5-41mm in size were found at the ileocecal valve and in the sigmoid colon.  A polypectomy was performed with cold forceps and with a cold snare. The resection was complete and the polyp tissue was completely retrieved.     Retroflexed views revealed no abnormalities.     The scope was then withdrawn from the patient and the procedure completed.  COMPLICATIONS: There were no complications. ENDOSCOPIC IMPRESSION: Two sessile polyps ranging between 5-60mm in size were found at the ileocecal valve and in the sigmoid colon; polypectomy was performed with cold forceps and with a cold snare  RECOMMENDATIONS: 1.  await pathology results 2.  High fiber diet   REPEAT EXAM: In 5 year(s)  for Colonoscopy.  cc:  _______________________________ eSignedHart Carwin, MD 11/23/2011 3:15 PM     PATIENT NAME:  Benjamin Cortez, Benjamin Cortez MR#: [id number]

## 2011-11-24 ENCOUNTER — Telehealth: Payer: Self-pay | Admitting: *Deleted

## 2011-11-24 NOTE — Telephone Encounter (Signed)
  Follow up Call-  Call back number 11/23/2011  Post procedure Call Back phone  # [phone]  Permission to leave phone message Yes     Patient questions:  Do you have a fever, pain , or abdominal swelling? no Pain Score  0 *  Have you tolerated food without any problems? yes  Have you been able to return to your normal activities? yes  Do you have any questions about your discharge instructions: Diet   no Medications  no Follow up visit  no  Do you have questions or concerns about your Care? no  Actions: * If pain score is 4 or above: No action needed, pain <4.

## 2011-11-29 ENCOUNTER — Encounter: Payer: Self-pay | Admitting: Internal Medicine

## 2011-12-14 ENCOUNTER — Other Ambulatory Visit: Payer: Self-pay | Admitting: *Deleted

## 2011-12-14 MED ORDER — SERTRALINE HCL 50 MG PO TABS: 50.0000 mg | ORAL_TABLET | Freq: Every day | ORAL | Status: DC

## 2012-01-27 ENCOUNTER — Encounter: Payer: Self-pay | Admitting: Pulmonary Disease

## 2012-01-27 ENCOUNTER — Ambulatory Visit (INDEPENDENT_AMBULATORY_CARE_PROVIDER_SITE_OTHER)
Admission: RE | Admit: 2012-01-27 | Discharge: 2012-01-27 | Disposition: A | Discharge status: Home / Self Care | Payer: 59 | Source: Ambulatory Visit | Attending: Pulmonary Disease | Admitting: Pulmonary Disease

## 2012-01-27 ENCOUNTER — Other Ambulatory Visit (INDEPENDENT_AMBULATORY_CARE_PROVIDER_SITE_OTHER): Payer: 59

## 2012-01-27 ENCOUNTER — Ambulatory Visit (INDEPENDENT_AMBULATORY_CARE_PROVIDER_SITE_OTHER): Payer: 59 | Admitting: Pulmonary Disease

## 2012-01-27 VITALS — BP 138/86 | HR 74 | Temp 98.6°F | Ht 67.0 in | Wt 221.4 lb

## 2012-01-27 DIAGNOSIS — Z0000 Encounter for general adult medical examination without abnormal findings: Secondary | ICD-10-CM

## 2012-01-27 DIAGNOSIS — Z9189 Other specified personal risk factors, not elsewhere classified: Secondary | ICD-10-CM

## 2012-01-27 DIAGNOSIS — D126 Benign neoplasm of colon, unspecified: Secondary | ICD-10-CM

## 2012-01-27 DIAGNOSIS — K219 Gastro-esophageal reflux disease without esophagitis: Secondary | ICD-10-CM

## 2012-01-27 DIAGNOSIS — F411 Generalized anxiety disorder: Secondary | ICD-10-CM

## 2012-01-27 DIAGNOSIS — N200 Calculus of kidney: Secondary | ICD-10-CM

## 2012-01-27 DIAGNOSIS — R51 Headache: Secondary | ICD-10-CM

## 2012-01-27 DIAGNOSIS — J309 Allergic rhinitis, unspecified: Secondary | ICD-10-CM

## 2012-01-27 LAB — LIPID PANEL
Cholesterol: 169 mg/dL (ref 0–200)
HDL: 48.6 mg/dL (ref >39.00)
Triglycerides: 51 mg/dL (ref 0.0–149.0)
VLDL: 10.2 mg/dL (ref 0.0–40.0)

## 2012-01-27 LAB — HEPATIC FUNCTION PANEL
ALT: 29 U/L (ref 0–53)
Albumin: 3.9 g/dL (ref 3.5–5.2)
Total Protein: 6.7 g/dL (ref 6.0–8.3)

## 2012-01-27 NOTE — Progress Notes (Signed)
Subjective:     Patient ID: Benjamin Cortez, male   DOB: [birth year], 66 y.o.   MRN: [id number]  HPI 66 y/o WM here for a follow up visit...   ~  January 28, 2011:  Yearly CPX> Benjamin Cortez continues to do well overall; CC= bone spur left kneecap eval by Alinda Sierras w/ brace rx, he uses Ibuprofen OTC as needed; he is too sedentary & overwt w/o change> we discussed diet + exercise;  His only prescription medication is ritalin taken for AdultADD;  See prob list below>>    Hx AB> no recent resp exacerbations; he uses MUCINEX as needed & likes to keep a ZPak handy for infection...    Overweight> he weighs 230#, 67" tall, BMI=36; he is way too sedentary & we discussed the need for diet/ exercise program...    GERD> on Prilosec OTC as needed...    Hx colon polyps> last colonoscopy was 12/03 by DrDBrodie w/ hyperplastic polyp; his 15yr f/u colon will be due in 2013...    Hx HAs> Hx ocular migraines in past w/ eval by DrLewitt; not requiring meds now...    ADD> on RITALIN 20mg  prn & doing well he says; he requests to continue same Rx...  ~  November 14, 2011:  57mo ROV & Benjamin Cortez saw TP 9/13 c/o feeling tired, no energy, but stressed at work & more HAs than usual, wants to retire but can't, family issues, etc;  He was given ZOLOFT50 & asked to exercise etc;  Now notes feeling better on the Zoloft, sl dry mouth, not using as much ritalin...    Hx AB> no recent resp exacerbations; he uses MUCINEX & Claritin as needed & likes to keep a ZPak handy for infection...    Overweight> his wt is down 13# to 217# but he is still way too sedentary & we discussed the need for diet/ exercise program...    GERD> on Prilosec OTC as needed...    Hx colon polyps> last colonoscopy was 12/03 by DrDBrodie w/ hyperplastic polyp; his 67yr f/u colon will be due in 2013 (he has f/u appt soon)...    Hx HAs> Hx ocular migraines in past w/ eval by DrLewitt; not requiring meds now...    ADD> on RITALIN 20mg  prn & doing well he says; he requests to  continue same Rx...    Situational Depression> doing better w/ the added Zoloft50 & rec to continue the same.. We reviewed prob list, meds, xrays and labs> see below for updates >> OK Flu shot today... LABS 9/13:  Chems- ok w/ K=5.3;  CBC- ok w/ Hg=15.4;  TSH=1.05;  Testos=440   ~  January 27, 2012:  Here for yearly CPX> and Benjamin Cortez reports that he is just days from retirement- "they made me an offer I couldn't refuse";  We reviewed the following medical problems during today's office visit >>      Hx AB> no recent resp exacerbations; he uses MUCINEX & Claritin as needed & likes to keep a ZPak handy for infection...    Overweight> his wt is back up 4# to 221# and he is still way too sedentary & we discussed the need for diet/ exercise program...    GERD> on Prilosec OTC as needed; he denies abd pain, n/v, c/d, blood seen...    Hx colon polyps> last colonoscopy was 12/03 by DrDBrodie w/ hyperplastic polyp; his 81yr f/u colon will be due in 2013 (he has f/u appt soon)...    Hx HAs> Hx ocular migraines  in past w/ eval by DrLewitt; not requiring meds now...    ADD> prev on RITALIN 20mg  prn & off now in retirement...    Situational Depression> doing better w/ the added Zoloft50 & rec to continue the same..  We reviewed prob list, meds, xrays and labs> see below for updates >>  CXR 12/13 showed heart wnl, lungs clear, NAD.Marland KitchenMarland Kitchen EKG 12/13 showed SBrady, rate58, minor NSSTTWA... LABS 12/13:  FLP- ok on diet w/ LDL=110... Labs 9/13:  Chems- wnl;  CBC- wnl;  TSH=1.05...          Problem List:   ALLERGIC RHINITIS (ICD-477.9) - on Claritin OTC Prn... prev testing showed mold sensitivity.  ASTHMATIC BRONCHITIS, ACUTE (ICD-466.0) - takes MUCINEX 1-2 Bid Prn & he uses his wife's cough syrup Prn... he would like ZPak for Prn use- ok. ~  CXR 12/12 showed norm heart size, clear lungs, NAD.Marland Kitchen. ~  CXR 12/13 showed heart wnl, lungs clear, NAD...  CHEST WALL PAIN, HX OF (ICD-V15.89) - on ASA 325mg /d... ~  he  participated in a [place] research study on atherosclerosis in 2000- as part of the protocol he had:      >>Cardiac CT - calcium score= zero...      >>Carotid Dopplers - negative, no signif blockage...      >>ABI's were normal... ~  NuclearStressTest 9/04 showed mildly abn EKG but neg images- w/o ischemia or infarct, EF=67%... ~  EKG 12/12 showed NSR rate=60, NSSTTWA, NAD... ~  EKG 12/13 showed SBrady, rate58, minor NSSTTWA...  OVERWEIGHT >> we discussed diet + exercise program needed for weight reduction... ~  Weight 12/11 = 228# ~  Weight 12/12 = 230# ~  Weight 10/13 = 217# ~  Weight 12/13 = 221#  GERD (ICD-530.81) - on OMEPRAZOLE 20mg /d (OTC generic med). ~  last EGD 9/04 by DrDBrodie showed 3cm HH r/o barrett's- bx showed mild inflamm only... subseq had GB removed 9/04 by DrHoxsworth...  COLONIC POLYPS (ICD-211.3) ~  last colonoscopy 12/03 by DrDBrodie showed 5mm polyp- hyperplastic on bx... f/u planned 2013.  RENAL CALCULUS (ICD-592.0) - hx kidney stone passed in 1989 & saw DrPeterson in past..  Hx of HEADACHE (ICD-784.0) - prev HA eval by DrLewitt in 2005... Dx w/ migraines w/ visual symptoms... not currently active or requiring meds...  ATTENTION DEFICIT DISORDER, HX OF (ICD-V11.8) - on RITALIN 20mg  Prn (?he uses son's Rx)... prev eval by Psychiatry/ Psychology w/ Adult ADD on Ritalin 20mg /d and doing well... he wants to continue Rx due to work, home life, etc... tolerates med well & denies side effects etc...  ANXIETY/ DEPRESSION >> started on ZOLOFT 50mg /d 9/13 & he reports feeling better & wants to continue this med...   Past Surgical History  Procedure Date  . Appendectomy 1959  . Cholecystectomy 2005  . Tonsillectomy 1959    Outpatient Encounter Prescriptions as of 01/27/2012  Medication Sig Dispense Refill  . aspirin 325 MG tablet Take 325 mg by mouth daily.        Marland Kitchen guaiFENesin (MUCINEX) 600 MG 12 hr tablet Take 1,200 mg by mouth 2 (two) times daily as needed.        . loratadine (CLARITIN) 10 MG tablet Take 10 mg by mouth daily.        . methylphenidate (RITALIN) 20 MG tablet Take 1 tablet (20 mg total) by mouth daily.  30 tablet  0  . Multiple Vitamin (MULTIVITAMIN) tablet Take 1 tablet by mouth daily.      Marland Kitchen omeprazole (PRILOSEC)  20 MG capsule Take 20 mg by mouth daily.        . sertraline (ZOLOFT) 50 MG tablet Take 1 tablet (50 mg total) by mouth daily.  90 tablet  0    Allergies  Allergen Reactions  . Codeine     REACTION: nausea  . Penicillins     As a child--rash  . Tetracycline     Rash, blisters    Current Medications, Allergies, Past Medical History, Past Surgical History, Family History, and Social History were reviewed in [employer] record.   Review of Systems         See HPI - all other systems neg except as noted...  The patient denies anorexia, fever, weight loss, weight gain, vision loss, decreased hearing, hoarseness, chest pain, syncope, dyspnea on exertion, peripheral edema, prolonged cough, headaches, hemoptysis, abdominal pain, melena, hematochezia, severe indigestion/heartburn, hematuria, incontinence, muscle weakness, suspicious skin lesions, transient blindness, difficulty walking, depression, unusual weight change, abnormal bleeding, enlarged lymph nodes, and angioedema.     Objective:   Physical Exam     WD, WN, 66 y/o WM in NAD... GENERAL:  Alert & oriented; pleasant & cooperative... HEENT:  Benjamin Cortez, EOM-wnl, PERRLA, Fundi-benign, EACs-clear, TMs-wnl, NOSE-clear, THROAT-clear & wnl. NECK:  Supple w/ full ROM; no JVD; normal carotid impulses w/o bruits; no thyromegaly or nodules palpated; no lymphadenopathy. CHEST:  Clear to P & A; without wheezes/ rales/ or rhonchi. HEART:  Regular Rhythm; without murmurs/ rubs/ or gallops. ABDOMEN:  Obese, soft & nontender; normal bowel sounds; no organomegaly or masses detected, no guarding or rebound or bruits. RECTAL:  Prostate is 2+ normal w/o nodules;  stool heme neg... EXT: without deformities or arthritic changes; no varicose veins/ venous insuffic/ or edema. NEURO:  CN's intact; motor testing normal; sensory testing normal; gait normal & balance OK. DERM:  No lesions noted; no rash etc...  RADIOLOGY DATA:  Reviewed in the EPIC EMR & discussed w/ the patient...   LABORATORY DATA:  Reviewed in the EPIC EMR & discussed w/ the patient...      Assessment:      AR, AB>  He denies exacerbations; doing satis on OTC meds prn...  Overweight>  His BMI is in the mid30s & he is way too sedentary; rec for incr exercise + diet efforts & wt reduction...  GERD>  Controlled on OTC Prilosec...  Hx colon polyps>  Up to date w/ f/u colon due 12/13 by DrDBrodie...  Hx kidney stones>  No recurrence...  Hx HAs>  No recurrence in his ocular migraines...  Adult ADD>  Stable on prn Ritalin 20mg  & he wishes to continue the same...  Anxiety/ Depression>  Improved on the Zoloft Rx- continue 50mg /d...     Plan:     Patient's Medications  New Prescriptions   No medications on file  Previous Medications   ASPIRIN 325 MG TABLET    Take 325 mg by mouth daily.     GUAIFENESIN (MUCINEX) 600 MG 12 HR TABLET    Take 1,200 mg by mouth 2 (two) times daily as needed.    LORATADINE (CLARITIN) 10 MG TABLET    Take 10 mg by mouth daily.     METHYLPHENIDATE (RITALIN) 20 MG TABLET    Take 1 tablet (20 mg total) by mouth daily.   MULTIPLE VITAMIN (MULTIVITAMIN) TABLET    Take 1 tablet by mouth daily.   OMEPRAZOLE (PRILOSEC) 20 MG CAPSULE    Take 20 mg by mouth daily.  SERTRALINE (ZOLOFT) 50 MG TABLET    Take 1 tablet (50 mg total) by mouth daily.  Modified Medications   No medications on file  Discontinued Medications   No medications on file

## 2012-01-27 NOTE — Patient Instructions (Addendum)
Today we updated your med list in our EPIC system...    Continue your current medications the same...  We discussed weaning off the Zoloft & congrats on your retirement!!!  Today we did your follow up CXR, EKG, & Fasting lipid profile...    We will contact you w/ the results when avail...  Call for any questions, or if we can be of service in any way.Marland KitchenMarland Kitchen

## 2012-08-15 ENCOUNTER — Ambulatory Visit: Payer: BC Managed Care – PPO

## 2012-08-16 ENCOUNTER — Telehealth: Payer: Self-pay | Admitting: Pulmonary Disease

## 2012-08-16 NOTE — Telephone Encounter (Signed)
Called spoke with patient who is leaving out of the country [place]) the first of August and would like to fill/take Cipro with him d/t the last time he made this trip he developed "GI issues".  Pt aware SN out of the office today and is okay with a call back tomorrow.    Advised pt of last Tdap vaccine and when next is due.  Dr Kriste Basque please advise on the cipro rx, thank you. [facility] Allergies  Allergen Reactions  . Codeine     REACTION: nausea  . Penicillins     As a child--rash  . Tetracycline     Rash, blisters

## 2012-08-17 ENCOUNTER — Telehealth: Payer: Self-pay | Admitting: Pulmonary Disease

## 2012-08-17 MED ORDER — CIPROFLOXACIN HCL 500 MG PO TABS: 500.0000 mg | ORAL_TABLET | Freq: Two times a day (BID) | ORAL | Status: DC

## 2012-08-17 NOTE — Telephone Encounter (Signed)
Benjamin Cortez has ordered the shingles vaccine for this pt.  i called and spoke with pt and Benjamin Cortez is aware that this will not be in until the middle of next week.  Benjamin Cortez is aware that i will call once this is in the office to see when Benjamin Cortez wants to come in for it.

## 2012-08-17 NOTE — Telephone Encounter (Signed)
Per SN---  Ok to call in cipro 500 mg  #20  1 po bid as directed.  Called and spoke with pts wife and she is aware of meds sent to the pharmacy.

## 2012-08-23 ENCOUNTER — Ambulatory Visit (INDEPENDENT_AMBULATORY_CARE_PROVIDER_SITE_OTHER): Payer: BC Managed Care – PPO

## 2012-08-23 DIAGNOSIS — Z2911 Encounter for prophylactic immunotherapy for respiratory syncytial virus (RSV): Secondary | ICD-10-CM

## 2012-08-23 DIAGNOSIS — Z23 Encounter for immunization: Secondary | ICD-10-CM

## 2012-08-23 NOTE — Telephone Encounter (Signed)
Shingles vaccine came in this afternoon.  i have called the pt and he will come in this afternoon.

## 2012-10-26 ENCOUNTER — Encounter (INDEPENDENT_AMBULATORY_CARE_PROVIDER_SITE_OTHER): Payer: BC Managed Care – PPO | Admitting: Ophthalmology

## 2012-10-26 DIAGNOSIS — H2510 Age-related nuclear cataract, unspecified eye: Secondary | ICD-10-CM

## 2012-10-26 DIAGNOSIS — H33309 Unspecified retinal break, unspecified eye: Secondary | ICD-10-CM

## 2012-10-26 DIAGNOSIS — H5340 Unspecified visual field defects: Secondary | ICD-10-CM

## 2012-10-26 DIAGNOSIS — H43819 Vitreous degeneration, unspecified eye: Secondary | ICD-10-CM

## 2012-11-09 ENCOUNTER — Ambulatory Visit (INDEPENDENT_AMBULATORY_CARE_PROVIDER_SITE_OTHER): Payer: BC Managed Care – PPO | Admitting: Ophthalmology

## 2012-11-09 DIAGNOSIS — H33309 Unspecified retinal break, unspecified eye: Secondary | ICD-10-CM

## 2012-12-13 ENCOUNTER — Other Ambulatory Visit: Payer: Self-pay

## 2013-01-17 DIAGNOSIS — H2510 Age-related nuclear cataract, unspecified eye: Secondary | ICD-10-CM | POA: Diagnosis not present

## 2013-01-17 DIAGNOSIS — H43819 Vitreous degeneration, unspecified eye: Secondary | ICD-10-CM | POA: Diagnosis not present

## 2013-01-24 ENCOUNTER — Ambulatory Visit (INDEPENDENT_AMBULATORY_CARE_PROVIDER_SITE_OTHER): Payer: Medicare Other | Admitting: Adult Health

## 2013-01-24 ENCOUNTER — Encounter: Payer: Self-pay | Admitting: Adult Health

## 2013-01-24 ENCOUNTER — Ambulatory Visit (INDEPENDENT_AMBULATORY_CARE_PROVIDER_SITE_OTHER)
Admission: RE | Admit: 2013-01-24 | Discharge: 2013-01-24 | Disposition: A | Discharge status: Home / Self Care | Payer: Medicare Other | Source: Ambulatory Visit | Attending: Adult Health | Admitting: Adult Health

## 2013-01-24 VITALS — BP 126/84 | HR 65 | Temp 98.0°F | Ht 66.0 in | Wt 229.4 lb

## 2013-01-24 DIAGNOSIS — R05 Cough: Secondary | ICD-10-CM | POA: Diagnosis not present

## 2013-01-24 DIAGNOSIS — J209 Acute bronchitis, unspecified: Secondary | ICD-10-CM

## 2013-01-24 MED ORDER — AZITHROMYCIN 250 MG PO TABS: ORAL_TABLET | ORAL | Status: AC

## 2013-01-24 NOTE — Patient Instructions (Addendum)
Zpack take as directed  Mucinex DM Twice daily  As needed   Saline nasal rinses As needed   Tylenol As needed   Chest xray today .  Follow up Dr. Nadel  In 3 months for physical  Please contact office for sooner follow up if symptoms do not improve or worsen or seek emergency care     

## 2013-01-28 NOTE — Progress Notes (Signed)
Quick Note:  Ov scheduled with TP 1.5.15 @ 3.30pm for 2 week follow up w/ cxr ______

## 2013-01-29 NOTE — Assessment & Plan Note (Signed)
Zpack take as directed  Mucinex DM Twice daily  As needed   Saline nasal rinses As needed   Tylenol As needed   Chest xray today .  Follow up Dr. Kriste Basque  In 3 months for physical  Please contact office for sooner follow up if symptoms do not improve or worsen or seek emergency care

## 2013-01-29 NOTE — Progress Notes (Signed)
Subjective:     Patient ID: Benjamin Cortez, male   DOB: May 07, 1945, 67 y.o.   MRN: [id number]  HPI  67 y/o WM   01/24/13 Acute OV  Complains of prod cough with green mucus, head congestion with same colored drainage, bloody nasal discharge  PND, hoarseness x2 weeks - denies wheezing, dyspnea, chest tightness, f/c/s. Using mucinex without much help.  Has retired now, feeling much less stress.  No recent travel or abx use.           Problem List:   ALLERGIC RHINITIS (ICD-477.9) - on Claritin OTC Prn... prev testing showed mold sensitivity.  ASTHMATIC BRONCHITIS, ACUTE (ICD-466.0) - takes MUCINEX 1-2 Bid Prn & he uses his wife's cough syrup Prn... he would like ZPak for Prn use- ok.  CHEST WALL PAIN, HX OF (ICD-V15.89) - on ASA 325mg /d... ~  he participated in a [place] research study on atherosclerosis in 2000- as part of the protocol he had:      >>Cardiac CT - calcium score= zero...      >>Carotid Dopplers - negative, no signif blockage...      >>ABI's were normal... ~  NuclearStressTest 9/04 showed mildly abn EKG but neg images- w/o ischemia or infarct, EF=67%... ~  EKG 12/12 showed NSR rate=60, NSSTTWA, NAD...  OVERWEIGHT >> we discussed diet + exercise program needed for weight reduction... ~  Weight 12/11 = 228# ~  Weight 12/12 = 230#  GERD (ICD-530.81) - on OMEPRAZOLE 20mg /d (OTC generic med). ~  last EGD 9/04 by DrDBrodie showed 3cm HH r/o barrett's- bx showed mild inflamm only... subseq had GB removed 9/04 by DrHoxsworth...  COLONIC POLYPS (ICD-211.3) ~  last colonoscopy 12/03 by DrDBrodie showed 5mm polyp- hyperplastic on bx... f/u planned 2013.  RENAL CALCULUS (ICD-592.0) - hx kidney stone passed in 1989 & saw DrPeterson in past..  Hx of HEADACHE (ICD-784.0) - prev HA eval by DrLewitt in 2005... Dx w/ migraines w/ visual symptoms... not currently active or requiring meds...  ATTENTION DEFICIT DISORDER, HX OF (ICD-V11.8) - on RITALIN 20mg  Prn (?he uses son's Rx)... prev eval  by Psychiatry/ Psychology w/ Adult ADD on Ritalin 20mg /d and doing well... he wants to continue Rx due to work, home life, etc... tolerates med well & denies side effects etc...  ANXIETY (ICD-300.00)   Past Surgical History  Procedure Laterality Date  . Appendectomy  1959  . Cholecystectomy  2005  . Tonsillectomy  1959    Outpatient Encounter Prescriptions as of 01/24/2013  Medication Sig  . cetirizine (ZYRTEC) 10 MG tablet Take 10 mg by mouth daily.  Marland Kitchen guaiFENesin (MUCINEX) 600 MG 12 hr tablet Take 1,200 mg by mouth 2 (two) times daily as needed.   . Multiple Vitamin (MULTIVITAMIN) tablet Take 1 tablet by mouth daily.  Marland Kitchen omeprazole (PRILOSEC) 20 MG capsule Take 20 mg by mouth daily.    Marland Kitchen aspirin 325 MG tablet Take 325 mg by mouth daily.    Marland Kitchen azithromycin (ZITHROMAX Z-PAK) 250 MG tablet Take 2 tablets (500 mg) on  Day 1,  followed by 1 tablet (250 mg) once daily on Days 2 through 5.  . methylphenidate (RITALIN) 20 MG tablet Take 1 tablet (20 mg total) by mouth daily.  . sertraline (ZOLOFT) 50 MG tablet Take 1 tablet (50 mg total) by mouth daily.  . [DISCONTINUED] ciprofloxacin (CIPRO) 500 MG tablet Take 1 tablet (500 mg total) by mouth 2 (two) times daily.  . [DISCONTINUED] loratadine (CLARITIN) 10 MG tablet Take 10 mg by  mouth daily.      Allergies  Allergen Reactions  . Codeine     REACTION: nausea  . Penicillins     As a child--rash  . Tetracycline     Rash, blisters    Current Medications, Allergies, Past Medical History, Past Surgical History, Family History, and Social History were reviewed in [employer] record.   Review of Systems Constitutional:   No  weight loss, night sweats,  Fevers, chills, ++ fatigue, or  lassitude.  HEENT:   No headaches,  Difficulty swallowing,  Tooth/dental problems, or  Sore throat,                No sneezing, itching, ear ache,  +nasal congestion, post nasal drip,   CV:  No chest pain,  Orthopnea, PND, swelling  in lower extremities, anasarca, dizziness, palpitations, syncope.   GI  No heartburn, indigestion, abdominal pain, nausea, vomiting, diarrhea, change in bowel habits, loss of appetite, bloody stools.   Resp:  .  No chest wall deformity  Skin: no rash or lesions.  GU: no dysuria, change in color of urine, no urgency or frequency.  No flank pain, no hematuria   MS:  No joint pain or swelling.  No decreased range of motion.  No back pain.                Objective:   Physical Exam      WD, WN, 67 y/o WM in NAD... GENERAL:  Alert & oriented; pleasant & cooperative... HEENT:  [place]/AT, EOM-wnl, PERRLA, Fundi-benign, EACs-clear, TMs-wnl, NOSE-clear drainage  THROAT-clear & wnl. NECK:  Supple w/ full ROM; no JVD; normal carotid impulses w/o bruits; no thyromegaly or nodules palpated; no lymphadenopathy. CHEST:  Clear to P & A; without wheezes/ rales/ or rhonchi. HEART:  Regular Rhythm; without murmurs/ rubs/ or gallops. ABDOMEN:  Obese, soft & nontender; normal bowel sounds; no organomegaly or masses detected, no guarding or rebound or bruits.  EXT: without deformities or arthritic changes; no varicose veins/ venous insuffic/ or edema. NEURO: ; gait normal & balance OK. DERM:  No lesions noted; no rash etc...    Assessment:     Plan:

## 2013-02-11 ENCOUNTER — Encounter: Payer: Self-pay | Admitting: Adult Health

## 2013-02-11 ENCOUNTER — Ambulatory Visit (INDEPENDENT_AMBULATORY_CARE_PROVIDER_SITE_OTHER)
Admission: RE | Admit: 2013-02-11 | Discharge: 2013-02-11 | Disposition: A | Discharge status: Home / Self Care | Payer: Medicare Other | Source: Ambulatory Visit | Attending: Adult Health | Admitting: Adult Health

## 2013-02-11 ENCOUNTER — Ambulatory Visit (INDEPENDENT_AMBULATORY_CARE_PROVIDER_SITE_OTHER): Payer: Medicare Other | Admitting: Adult Health

## 2013-02-11 ENCOUNTER — Other Ambulatory Visit: Payer: Self-pay | Admitting: Adult Health

## 2013-02-11 VITALS — BP 128/82 | HR 78 | Temp 98.6°F | Ht 66.0 in | Wt 230.6 lb

## 2013-02-11 DIAGNOSIS — J40 Bronchitis, not specified as acute or chronic: Secondary | ICD-10-CM | POA: Diagnosis not present

## 2013-02-11 DIAGNOSIS — J209 Acute bronchitis, unspecified: Secondary | ICD-10-CM | POA: Diagnosis not present

## 2013-02-11 DIAGNOSIS — R918 Other nonspecific abnormal finding of lung field: Secondary | ICD-10-CM | POA: Diagnosis not present

## 2013-02-11 NOTE — Patient Instructions (Signed)
Follow up Dr. Lenna Gilford as planned and As needed   Please contact office for sooner follow up if symptoms do not improve or worsen or seek emergency care

## 2013-02-11 NOTE — Assessment & Plan Note (Signed)
Flare now resolved with tx  CXR is cleared   Plan  Cont on current regimen  Follow up with Dr. Lenna Gilford  As planned

## 2013-02-11 NOTE — Progress Notes (Signed)
Subjective:     Patient ID: Benjamin Cortez, male   DOB: 1946/02/07, 67 y.o.   MRN: [id number]  HPI  68 y/o WM   01/24/13 Acute OV  Complains of prod cough with green mucus, head congestion with same colored drainage, bloody nasal discharge  PND, hoarseness x2 weeks - denies wheezing, dyspnea, chest tightness, f/c/s. Using mucinex without much help.  Has retired now, feeling much less stress.  No recent travel or abx use.  >Zpack , CXR with ? LLL atx vs infiltrate    02/11/2013 Follow up  Pt returns for 2 week follow up for bronchitis . Was seen last ov with cough and congestion. Tx w/ zpack  Xray showed possible LLL infiltrate vs atx .  He says he is feeling much better .  CXR today shows clear lungs.  No fever, chest pain, orthopnea, or edema.           Problem List:   ALLERGIC RHINITIS (ICD-477.9) - on Claritin OTC Prn... prev testing showed mold sensitivity.  ASTHMATIC BRONCHITIS, ACUTE (ICD-466.0) - takes MUCINEX 1-2 Bid Prn & he uses his wife's cough syrup Prn... he would like ZPak for Prn use- ok.  CHEST WALL PAIN, HX OF (ICD-V15.89) - on ASA 325mg /d... ~  he participated in a [place] study on atherosclerosis in 2000- as part of the protocol he had:      >>Cardiac CT - calcium score= zero...      >>Carotid Dopplers - negative, no signif blockage...      >>ABI's were normal... ~  NuclearStressTest 9/04 showed mildly abn EKG but neg images- w/o ischemia or infarct, EF=67%... ~  EKG 12/12 showed NSR rate=60, NSSTTWA, NAD...  OVERWEIGHT >> we discussed diet + exercise program needed for weight reduction... ~  Weight 12/11 = 228# ~  Weight 12/12 = 230#  GERD (ICD-530.81) - on OMEPRAZOLE 20mg /d (OTC generic med). ~  last EGD 9/04 by DrDBrodie showed 3cm HH r/o barrett's- bx showed mild inflamm only... subseq had GB removed 9/04 by DrHoxsworth...  COLONIC POLYPS (ICD-211.3) ~  last colonoscopy 12/03 by DrDBrodie showed 62mm polyp- hyperplastic on bx... f/u planned  2013.  RENAL CALCULUS (ICD-592.0) - hx kidney stone passed in 1989 & saw DrPeterson in past..  Hx of HEADACHE (ICD-784.0) - prev HA eval by DrLewitt in 2005... Dx w/ migraines w/ visual symptoms... not currently active or requiring meds...  ATTENTION DEFICIT DISORDER, HX OF (ICD-V11.8) - on RITALIN 20mg  Prn (?he uses son's Rx)... prev eval by Psychiatry/ Psychology w/ Adult ADD on Ritalin 20mg /d and doing well... he wants to continue Rx due to work, home life, etc... tolerates med well & denies side effects etc...  ANXIETY (ICD-300.00)   Past Surgical History  Procedure Laterality Date  . Appendectomy  1959  . Cholecystectomy  2005  . Tonsillectomy  1959    Outpatient Encounter Prescriptions as of 02/11/2013  Medication Sig  . aspirin 325 MG tablet Take 325 mg by mouth daily.    . cetirizine (ZYRTEC) 10 MG tablet Take 10 mg by mouth daily.  Marland Kitchen guaiFENesin (MUCINEX) 600 MG 12 hr tablet Take 1,200 mg by mouth 2 (two) times daily as needed.   . methylphenidate (RITALIN) 20 MG tablet Take 1 tablet (20 mg total) by mouth daily.  . Multiple Vitamin (MULTIVITAMIN) tablet Take 1 tablet by mouth daily.  Marland Kitchen omeprazole (PRILOSEC) 20 MG capsule Take 20 mg by mouth daily.    . sertraline (ZOLOFT) 50 MG tablet Take 1  tablet (50 mg total) by mouth daily.    Allergies  Allergen Reactions  . Codeine     REACTION: nausea  . Penicillins     As a child--rash  . Tetracycline     Rash, blisters    Current Medications, Allergies, Past Medical History, Past Surgical History, Family History, and Social History were reviewed in [employer] record.   Review of Systems Constitutional:   No  weight loss, night sweats,  Fevers, chills, fatigue, or  lassitude.  HEENT:   No headaches,  Difficulty swallowing,  Tooth/dental problems, or  Sore throat,                No sneezing, itching, ear ache, nasal congestion, post nasal drip,   CV:  No chest pain,  Orthopnea, PND, swelling in  lower extremities, anasarca, dizziness, palpitations, syncope.   GI  No heartburn, indigestion, abdominal pain, nausea, vomiting, diarrhea, change in bowel habits, loss of appetite, bloody stools.   Resp:  .  No chest wall deformity  Skin: no rash or lesions.  GU: no dysuria, change in color of urine, no urgency or frequency.  No flank pain, no hematuria   MS:  No joint pain or swelling.  No decreased range of motion.  No back pain.                Objective:   Physical Exam      WD, WN, 68 y/o WM in NAD... GENERAL:  Alert & oriented; pleasant & cooperative... HEENT:  [place]/AT, EOM-wnl, EACs-clear, TMs-wnl, NOSE-clear drainage  THROAT-clear & wnl. NECK:  Supple w/ full ROM; no JVD; normal carotid impulses w/o bruits; no thyromegaly or nodules palpated; no lymphadenopathy. CHEST:  Clear to P & A; without wheezes/ rales/ or rhonchi. HEART:  Regular Rhythm; without murmurs/ rubs/ or gallops. ABDOMEN:  Obese, soft & nontender; normal bowel sounds; no organomegaly or masses detected, no guarding or rebound or bruits.  EXT: without deformities or arthritic changes; no varicose veins/ venous insuffic/ or edema. NEURO: ; gait normal & balance OK. DERM:  No lesions noted; no rash etc...    Assessment:     Plan:

## 2013-03-14 ENCOUNTER — Ambulatory Visit (INDEPENDENT_AMBULATORY_CARE_PROVIDER_SITE_OTHER): Payer: Medicare Other | Admitting: Ophthalmology

## 2013-03-14 DIAGNOSIS — H2510 Age-related nuclear cataract, unspecified eye: Secondary | ICD-10-CM

## 2013-03-14 DIAGNOSIS — H33309 Unspecified retinal break, unspecified eye: Secondary | ICD-10-CM

## 2013-03-14 DIAGNOSIS — H43819 Vitreous degeneration, unspecified eye: Secondary | ICD-10-CM | POA: Diagnosis not present

## 2013-04-25 ENCOUNTER — Ambulatory Visit (INDEPENDENT_AMBULATORY_CARE_PROVIDER_SITE_OTHER): Payer: Medicare Other | Admitting: Pulmonary Disease

## 2013-04-25 ENCOUNTER — Other Ambulatory Visit (INDEPENDENT_AMBULATORY_CARE_PROVIDER_SITE_OTHER): Payer: Medicare Other

## 2013-04-25 ENCOUNTER — Encounter: Payer: Self-pay | Admitting: Pulmonary Disease

## 2013-04-25 VITALS — BP 148/78 | HR 60 | Temp 97.0°F | Ht 67.0 in | Wt 231.8 lb

## 2013-04-25 DIAGNOSIS — N320 Bladder-neck obstruction: Secondary | ICD-10-CM

## 2013-04-25 DIAGNOSIS — J309 Allergic rhinitis, unspecified: Secondary | ICD-10-CM | POA: Diagnosis not present

## 2013-04-25 DIAGNOSIS — D126 Benign neoplasm of colon, unspecified: Secondary | ICD-10-CM

## 2013-04-25 DIAGNOSIS — N200 Calculus of kidney: Secondary | ICD-10-CM

## 2013-04-25 DIAGNOSIS — E7800 Pure hypercholesterolemia, unspecified: Secondary | ICD-10-CM

## 2013-04-25 DIAGNOSIS — F411 Generalized anxiety disorder: Secondary | ICD-10-CM | POA: Diagnosis not present

## 2013-04-25 DIAGNOSIS — Z23 Encounter for immunization: Secondary | ICD-10-CM

## 2013-04-25 DIAGNOSIS — R51 Headache: Secondary | ICD-10-CM

## 2013-04-25 DIAGNOSIS — J209 Acute bronchitis, unspecified: Secondary | ICD-10-CM | POA: Diagnosis not present

## 2013-04-25 DIAGNOSIS — K219 Gastro-esophageal reflux disease without esophagitis: Secondary | ICD-10-CM | POA: Diagnosis not present

## 2013-04-25 LAB — CBC WITH DIFFERENTIAL/PLATELET
Basophils Absolute: 0 10*3/uL (ref 0.0–0.1)
Basophils Relative: 0.3 % (ref 0.0–3.0)
EOS PCT: 3.5 % (ref 0.0–5.0)
Eosinophils Absolute: 0.2 10*3/uL (ref 0.0–0.7)
HCT: 46 % (ref 39.0–52.0)
Hemoglobin: 15.8 g/dL (ref 13.0–17.0)
LYMPHS PCT: 27 % (ref 12.0–46.0)
Lymphs Abs: 1.4 10*3/uL (ref 0.7–4.0)
MCHC: 34.2 g/dL (ref 30.0–36.0)
MCV: 93.7 fl (ref 78.0–100.0)
MONOS PCT: 10.5 % (ref 3.0–12.0)
Monocytes Absolute: 0.6 10*3/uL (ref 0.1–1.0)
NEUTROS ABS: 3.1 10*3/uL (ref 1.4–7.7)
NEUTROS PCT: 58.7 % (ref 43.0–77.0)
Platelets: 242 10*3/uL (ref 150.0–400.0)
RBC: 4.91 Mil/uL (ref 4.22–5.81)
RDW: 12.9 % (ref 11.5–14.6)
WBC: 5.3 10*3/uL (ref 4.5–10.5)

## 2013-04-25 LAB — BASIC METABOLIC PANEL
BUN: 14 mg/dL (ref 6–23)
CALCIUM: 8.9 mg/dL (ref 8.4–10.5)
CO2: 27 meq/L (ref 19–32)
CREATININE: 1.1 mg/dL (ref 0.4–1.5)
Chloride: 105 mEq/L (ref 96–112)
GFR: 69.17 mL/min (ref >60.00)
Glucose, Bld: 107 mg/dL — ABNORMAL HIGH (ref 70–99)
Potassium: 4.5 mEq/L (ref 3.5–5.1)
Sodium: 141 mEq/L (ref 135–145)

## 2013-04-25 LAB — LIPID PANEL
CHOL/HDL RATIO: 4
CHOLESTEROL: 187 mg/dL (ref 0–200)
HDL: 49.9 mg/dL (ref >39.00)
LDL Cholesterol: 124 mg/dL — ABNORMAL HIGH (ref 0–99)
Triglycerides: 66 mg/dL (ref 0.0–149.0)
VLDL: 13.2 mg/dL (ref 0.0–40.0)

## 2013-04-25 LAB — HEPATIC FUNCTION PANEL
ALBUMIN: 4.2 g/dL (ref 3.5–5.2)
ALT: 39 U/L (ref 0–53)
AST: 29 U/L (ref 0–37)
Alkaline Phosphatase: 95 U/L (ref 39–117)
BILIRUBIN DIRECT: 0.2 mg/dL (ref 0.0–0.3)
Total Bilirubin: 1.1 mg/dL (ref 0.3–1.2)
Total Protein: 6.6 g/dL (ref 6.0–8.3)

## 2013-04-25 LAB — TSH: TSH: 1.09 u[IU]/mL (ref 0.35–5.50)

## 2013-04-25 LAB — PSA: PSA: 1.05 ng/mL (ref 0.10–4.00)

## 2013-04-25 NOTE — Patient Instructions (Signed)
Today we updated your med list in our EPIC system...    Continue your current medications the same...  Today we did your follow up FASTING blood work...    We will contact you w/ the results when available...   Call for any questions or if we can be of service in any way... 

## 2013-04-25 NOTE — Progress Notes (Signed)
Subjective:     Patient ID: Benjamin Cortez, male   DOB: 1945/11/22, 68 y.o.   MRN: [id number]  HPI 68 y/o WM here for a follow up visit...   ~  January 28, 2011:  Yearly CPX> Benjamin Cortez continues to do well overall; CC= bone spur left kneecap eval by Lacie Scotts w/ brace rx, he uses Ibuprofen OTC as needed; he is too sedentary & overwt w/o change> we discussed diet + exercise;  His only prescription medication is ritalin taken for AdultADD;  See prob list below>>    Hx AB> no recent resp exacerbations; he uses [place] as needed & likes to keep a ZPak handy for infection...    Overweight> he weighs 230#, 67" tall, BMI=36; he is way too sedentary & we discussed the need for diet/ exercise program...    GERD> on Prilosec OTC as needed...    Hx colon polyps> last colonoscopy was 12/03 by DrDBrodie w/ hyperplastic polyp; his 89yr f/u colon will be due in 2013...    Hx HAs> Hx ocular migraines in past w/ eval by DrLewitt; not requiring meds now...    ADD> on RITALIN 20mg  prn & doing well he says; he requests to continue same Rx...  ~  November 14, 2011:  2mo ROV & Benjamin Cortez saw TP 9/13 c/o feeling tired, no energy, but stressed at work & more HAs than usual, wants to retire but can't, family issues, etc;  He was given ZOLOFT50 & asked to exercise etc;  Now notes feeling better on the Zoloft, sl dry mouth, not using as much ritalin...    Hx AB> no recent resp exacerbations; he uses [place] as needed & likes to keep a ZPak handy for infection...    Overweight> his wt is down 13# to 217# but he is still way too sedentary & we discussed the need for diet/ exercise program...    GERD> on Prilosec OTC as needed...    Hx colon polyps> last colonoscopy was 12/03 by DrDBrodie w/ hyperplastic polyp; his 57yr f/u colon will be due in 2013 (he has f/u appt soon)...    Hx HAs> Hx ocular migraines in past w/ eval by DrLewitt; not requiring meds now...    ADD> on RITALIN 20mg  prn & doing well he says; he requests to  continue same Rx...    Situational Depression> doing better w/ the added Zoloft50 & rec to continue the same.. We reviewed prob list, meds, xrays and labs> see below for updates >> OK Flu shot today...  LABS 9/13:  Chems- ok w/ K=5.3;  CBC- ok w/ Hg=15.4;  TSH=1.05;  Testos=440   ~  January 27, 2012:  Here for yearly CPX> and Benjamin Cortez reports that he is just days from retirement- "they made me an offer I couldn't refuse";  We reviewed the following medical problems during today's office visit >>     Hx AB> no recent resp exacerbations; he uses [place] as needed & likes to keep a ZPak handy for infection...    Overweight> his wt is back up 4# to 221# and he is still way too sedentary & we discussed the need for diet/ exercise program...    GERD> on Prilosec OTC as needed; he denies abd pain, n/v, c/d, blood seen...    Hx colon polyps> last colonoscopy was 12/03 by DrDBrodie w/ hyperplastic polyp; his 14yr f/u colon will be due in 2013 (he has f/u appt soon)...    Hx HAs> Hx ocular migraines  in past w/ eval by DrLewitt; not requiring meds now...    ADD> prev on RITALIN 20mg  prn & off now in retirement...    Situational Depression> doing better w/ the added Zoloft50 & rec to continue the same.. We reviewed prob list, meds, xrays and labs> see below for updates >>   CXR 12/13 showed heart wnl, lungs clear, NAD.Marland KitchenMarland Kitchen  EKG 12/13 showed SBrady, rate58, minor NSSTTWA...  LABS 12/13:  FLP- ok on diet w/ LDL=110...  Labs 9/13:  Chems- wnl;  CBC- wnl;  TSH=1.05...   ~  April 25, 2013:  70mo Benjamin Cortez has turned 18, reports doing well- no new complaints or concerns    Hx AB> no recent resp exacerbations; he uses MUCINEX & Zyrtek as needed & likes to keep a ZPak handy for infection...    Overweight> his wt is back up 11# to 232# and he is still way too sedentary & we discussed the need for diet/ exercise program...    GERD> on Prilosec daily; he denies abd pain, n/v, c/d, blood seen; last EGD  10/13 w/ inflamm but neg bx for Barrett's...    Hx colon polyps> last colonoscopy was 10/13 by DrDBrodie w/ 2 polyps & Bx= adenoma w/ f/u suggested in 67yrs.    Hx HAs> Hx ocular migraines in past w/ eval by DrLewitt; not requiring meds now...    ADD> prev on RITALIN 20mg  prn & off now in retirement...    Situational Depression> prev improved on Zoloft50 & off now- doing satis... We reviewed prob list, meds, xrays and labs> see below for updates >>   LABS 3/15:  FLP- ok x LDL=124;  Chems- wnl;  CBC- wnl;  TSH=1.09;  PSA=1.05...            Problem List:   ALLERGIC RHINITIS (ICD-477.9) - on Claritin OTC Prn... prev testing showed mold sensitivity.  ASTHMATIC BRONCHITIS, ACUTE (ICD-466.0) - takes MUCINEX 1-2 Bid Prn & he uses his wife's cough syrup Prn... he would like ZPak for Prn use- ok. ~  CXR 12/12 showed norm heart size, clear lungs, NAD.Marland Kitchen. ~  CXR 12/13 showed heart wnl, lungs clear, NAD...  CHEST WALL PAIN, HX OF (ICD-V15.89) - on ASA 325mg /d... ~  he participated in a [place] study on atherosclerosis in 2000- as part of the protocol he had:      >>Cardiac CT - calcium score= zero...      >>Carotid Dopplers - negative, no signif blockage...      >>ABI's were normal... ~  NuclearStressTest 9/04 showed mildly abn EKG but neg images- w/o ischemia or infarct, EF=67%... ~  EKG 12/12 showed NSR rate=60, NSSTTWA, NAD... ~  EKG 12/13 showed SBrady, rate58, minor NSSTTWA...  OVERWEIGHT >> we discussed diet + exercise program needed for weight reduction... ~  Weight 12/11 = 228# ~  Weight 12/12 = 230# ~  Weight 10/13 = 217# ~  Weight 12/13 = 221# ~  Weight 3/15 = 232#  GERD (ICD-530.81) - on OMEPRAZOLE 20mg /d (OTC generic med). ~  last EGD 9/04 by DrDBrodie showed 3cm HH r/o barrett's- bx showed mild inflamm only... subseq had GB removed 9/04 by DrHoxsworth... ~  EGD 10/13 w/ inflamm but neg bx for Barrett's... rec to take Prilosec20 daily...  COLONIC POLYPS (ICD-211.3) ~  last  colonoscopy 12/03 by DrDBrodie showed 39mm polyp- hyperplastic on bx... f/u planned 2013. ~  Colonoscopy 10/13 w/ 2 polyps & Bx= adenoma w/ f/u suggested in 70yrs.  RENAL CALCULUS (  ICD-592.0) - hx kidney stone passed in 1989 & saw DrPeterson in past..  Hx of HEADACHE (ICD-784.0) - prev HA eval by DrLewitt in 2005... Dx w/ migraines w/ visual symptoms... not currently active or requiring meds...  ATTENTION DEFICIT DISORDER, HX OF (ICD-V11.8) - on RITALIN 20mg  Prn (?he uses son's Rx)... prev eval by Psychiatry/ Psychology w/ Adult ADD on Ritalin 20mg /d and doing well... he wants to continue Rx due to work, home life, etc... tolerates med well & denies side effects etc...  ANXIETY/ DEPRESSION >> started on ZOLOFT 50mg /d 9/13 & he reports feeling better & wants to continue this med...   Past Surgical History  Procedure Laterality Date  . Appendectomy  1959  . Cholecystectomy  2005  . Tonsillectomy  1959    Outpatient Encounter Prescriptions as of 04/25/2013  Medication Sig  . aspirin 325 MG tablet Take 325 mg by mouth daily.    . cetirizine (ZYRTEC) 10 MG tablet Take 10 mg by mouth daily.  Marland Kitchen guaiFENesin (MUCINEX) 600 MG 12 hr tablet Take 1,200 mg by mouth 2 (two) times daily as needed.   . Multiple Vitamin (MULTIVITAMIN) tablet Take 1 tablet by mouth daily.  Marland Kitchen omeprazole (PRILOSEC) 20 MG capsule Take 20 mg by mouth daily.    . [DISCONTINUED] methylphenidate (RITALIN) 20 MG tablet Take 1 tablet (20 mg total) by mouth daily.  . [DISCONTINUED] sertraline (ZOLOFT) 50 MG tablet Take 1 tablet (50 mg total) by mouth daily.    Allergies  Allergen Reactions  . Codeine     REACTION: nausea  . Penicillins     As a child--rash  . Tetracycline     Rash, blisters    Current Medications, Allergies, Past Medical History, Past Surgical History, Family History, and Social History were reviewed in [employer] record.   Review of Systems         See HPI - all other systems  neg except as noted...  The patient denies anorexia, fever, weight loss, weight gain, vision loss, decreased hearing, hoarseness, chest pain, syncope, dyspnea on exertion, peripheral edema, prolonged cough, headaches, hemoptysis, abdominal pain, melena, hematochezia, severe indigestion/heartburn, hematuria, incontinence, muscle weakness, suspicious skin lesions, transient blindness, difficulty walking, depression, unusual weight change, abnormal bleeding, enlarged lymph nodes, and angioedema.     Objective:   Physical Exam     WD, WN, 68 y/o WM in NAD... GENERAL:  Alert & oriented; pleasant & cooperative... HEENT:  [place]/AT, EOM-wnl, PERRLA, [place]-benign, EACs-clear, TMs-wnl, NOSE-clear, THROAT-clear & wnl. NECK:  Supple w/ full ROM; no JVD; normal carotid impulses w/o bruits; no thyromegaly or nodules palpated; no lymphadenopathy. CHEST:  Clear to P & A; without wheezes/ rales/ or rhonchi. HEART:  Regular Rhythm; without murmurs/ rubs/ or gallops. ABDOMEN:  Obese, soft & nontender; normal bowel sounds; no organomegaly or masses detected, no guarding or rebound or bruits. RECTAL:  Prostate is 2+ normal w/o nodules; stool heme neg... EXT: without deformities or arthritic changes; no varicose veins/ venous insuffic/ or edema. NEURO:  CN's intact; motor testing normal; sensory testing normal; gait normal & balance OK. DERM:  No lesions noted; no rash etc...  RADIOLOGY DATA:  Reviewed in the EPIC EMR & discussed w/ the patient...   LABORATORY DATA:  Reviewed in the EPIC EMR & discussed w/ the patient...      Assessment:      AR, AB>  He denies exacerbations; doing satis on OTC meds prn...  Overweight>  His BMI is in the mid30s &  he is way too sedentary; rec for incr exercise + diet efforts & wt reduction...  GERD>  Controlled on OTC Prilosec...  Hx colon polyps>  Colon 10/13 w/ adenomatous polyp => f/u 22yrs...  Hx kidney stones>  No recurrence...  Hx HAs>  No recurrence in his ocular  migraines...  Adult ADD>  Stable off Ritalin in retirement...  Anxiety/ Depression> Stable off Zoloft Rx now in retirement...     Plan:     Patient's Medications  New Prescriptions   No medications on file  Previous Medications   ASPIRIN 325 MG TABLET    Take 325 mg by mouth daily.     CETIRIZINE (ZYRTEC) 10 MG TABLET    Take 10 mg by mouth daily.   GUAIFENESIN (MUCINEX) 600 MG 12 HR TABLET    Take 1,200 mg by mouth 2 (two) times daily as needed.    MULTIPLE VITAMIN (MULTIVITAMIN) TABLET    Take 1 tablet by mouth daily.   OMEPRAZOLE (PRILOSEC) 20 MG CAPSULE    Take 20 mg by mouth daily.    Modified Medications   No medications on file  Discontinued Medications   METHYLPHENIDATE (RITALIN) 20 MG TABLET    Take 1 tablet (20 mg total) by mouth daily.   SERTRALINE (ZOLOFT) 50 MG TABLET    Take 1 tablet (50 mg total) by mouth daily.

## 2013-10-04 ENCOUNTER — Encounter: Payer: Self-pay | Admitting: Internal Medicine

## 2013-11-06 DIAGNOSIS — Z23 Encounter for immunization: Secondary | ICD-10-CM | POA: Diagnosis not present

## 2014-01-23 DIAGNOSIS — H524 Presbyopia: Secondary | ICD-10-CM | POA: Diagnosis not present

## 2014-01-23 DIAGNOSIS — H2513 Age-related nuclear cataract, bilateral: Secondary | ICD-10-CM | POA: Diagnosis not present

## 2014-02-03 ENCOUNTER — Emergency Department (INDEPENDENT_AMBULATORY_CARE_PROVIDER_SITE_OTHER)
Admission: EM | Admit: 2014-02-03 | Discharge: 2014-02-03 | Disposition: A | Discharge status: Home / Self Care | Payer: Medicare Other | Source: Home / Self Care | Attending: Emergency Medicine | Admitting: Emergency Medicine

## 2014-02-03 ENCOUNTER — Encounter (HOSPITAL_COMMUNITY): Payer: Self-pay

## 2014-02-03 DIAGNOSIS — J029 Acute pharyngitis, unspecified: Secondary | ICD-10-CM

## 2014-02-03 DIAGNOSIS — R0982 Postnasal drip: Secondary | ICD-10-CM

## 2014-02-03 LAB — POCT RAPID STREP A: STREPTOCOCCUS, GROUP A SCREEN (DIRECT): NEGATIVE

## 2014-02-03 NOTE — ED Notes (Signed)
Please call [phone] for lab issues

## 2014-02-03 NOTE — ED Notes (Signed)
C/o ~ 3 week duration of ST, dry cough. Not responding to usual home treatment. NAD

## 2014-02-03 NOTE — Discharge Instructions (Signed)
Sore Throat A sore throat is pain, burning, irritation, or scratchiness of the throat. There is often pain or tenderness when swallowing or talking. A sore throat may be accompanied by other symptoms, such as coughing, sneezing, fever, and swollen neck glands. A sore throat is often the first sign of another sickness, such as a cold, flu, strep throat, or mononucleosis (commonly known as mono). Most sore throats go away without medical treatment. CAUSES  The most common causes of a sore throat include:  A viral infection, such as a cold, flu, or mono.  A bacterial infection, such as strep throat, tonsillitis, or whooping cough.  Seasonal allergies.  Dryness in the air.  Irritants, such as smoke or pollution.  Gastroesophageal reflux disease (GERD). HOME CARE INSTRUCTIONS   Only take over-the-counter medicines as directed by your caregiver.  Drink enough fluids to keep your urine clear or pale yellow.  Rest as needed.  Try using throat sprays, lozenges, or sucking on hard candy to ease any pain (if older than 4 years or as directed).  Sip warm liquids, such as broth, herbal tea, or warm water with honey to relieve pain temporarily. You may also eat or drink cold or frozen liquids such as frozen ice pops.  Gargle with salt water (mix 1 tsp salt with 8 oz of water).  Do not smoke and avoid secondhand smoke.  Put a cool-mist humidifier in your bedroom at night to moisten the air. You can also turn on a hot shower and sit in the bathroom with the door closed for 5-10 minutes. SEEK IMMEDIATE MEDICAL CARE IF:  You have difficulty breathing.  You are unable to swallow fluids, soft foods, or your saliva.  You have increased swelling in the throat.  Your sore throat does not get better in 7 days.  You have nausea and vomiting.  You have a fever or persistent symptoms for more than 2-3 days.  You have a fever and your symptoms suddenly get worse. MAKE SURE YOU:   Understand  these instructions.  Will watch your condition.  Will get help right away if you are not doing well or get worse. Document Released: 03/03/2004 Document Revised: 01/11/2012 Document Reviewed: 10/02/2011 [facility] Patient Information 2015 [place], [place]. This information is not intended to replace advice given to you by your health care provider. Make sure you discuss any questions you have with your health care provider.  Upper Respiratory Infection, Adult May continue the zyrtec. Add Chlor Trimeton 2 mg as directed. Lots of fluids Cepacol lozenges An upper respiratory infection (URI) is also sometimes known as the common cold. The upper respiratory tract includes the nose, sinuses, throat, trachea, and bronchi. Bronchi are the airways leading to the lungs. Most people improve within 1 week, but symptoms can last up to 2 weeks. A residual cough may last even longer.  CAUSES Many different viruses can infect the tissues lining the upper respiratory tract. The tissues become irritated and inflamed and often become very moist. Mucus production is also common. A cold is contagious. You can easily spread the virus to others by oral contact. This includes kissing, sharing a glass, coughing, or sneezing. Touching your mouth or nose and then touching a surface, which is then touched by another person, can also spread the virus. SYMPTOMS  Symptoms typically develop 1 to 3 days after you come in contact with a cold virus. Symptoms vary from person to person. They may include:  Runny nose.  Sneezing.  Nasal congestion.  Sinus irritation.  Sore throat.  Loss of voice (laryngitis).  Cough.  Fatigue.  Muscle aches.  Loss of appetite.  Headache.  Low-grade fever. DIAGNOSIS  You might diagnose your own cold based on familiar symptoms, since most people get a cold 2 to 3 times a year. Your caregiver can confirm this based on your exam. Most importantly, your caregiver can check that your  symptoms are not due to another disease such as strep throat, sinusitis, pneumonia, asthma, or epiglottitis. Blood tests, throat tests, and X-rays are not necessary to diagnose a common cold, but they may sometimes be helpful in excluding other more serious diseases. Your caregiver will decide if any further tests are required. RISKS AND COMPLICATIONS  You may be at risk for a more severe case of the common cold if you smoke cigarettes, have chronic heart disease (such as heart failure) or lung disease (such as asthma), or if you have a weakened immune system. The very young and very old are also at risk for more serious infections. Bacterial sinusitis, middle ear infections, and bacterial pneumonia can complicate the common cold. The common cold can worsen asthma and chronic obstructive pulmonary disease (COPD). Sometimes, these complications can require emergency medical care and may be life-threatening. PREVENTION  The best way to protect against getting a cold is to practice good hygiene. Avoid oral or hand contact with people with cold symptoms. Wash your hands often if contact occurs. There is no clear evidence that vitamin C, vitamin E, echinacea, or exercise reduces the chance of developing a cold. However, it is always recommended to get plenty of rest and practice good nutrition. TREATMENT  Treatment is directed at relieving symptoms. There is no cure. Antibiotics are not effective, because the infection is caused by a virus, not by bacteria. Treatment may include:  Increased fluid intake. Sports drinks offer valuable electrolytes, sugars, and fluids.  Breathing heated mist or steam (vaporizer or shower).  Eating chicken soup or other clear broths, and maintaining good nutrition.  Getting plenty of rest.  Using gargles or lozenges for comfort.  Controlling fevers with ibuprofen or acetaminophen as directed by your caregiver.  Increasing usage of your inhaler if you have asthma. Zinc  gel and zinc lozenges, taken in the first 24 hours of the common cold, can shorten the duration and lessen the severity of symptoms. Pain medicines may help with fever, muscle aches, and throat pain. A variety of non-prescription medicines are available to treat congestion and runny nose. Your caregiver can make recommendations and may suggest nasal or lung inhalers for other symptoms.  HOME CARE INSTRUCTIONS   Only take over-the-counter or prescription medicines for pain, discomfort, or fever as directed by your caregiver.  Use a warm mist humidifier or inhale steam from a shower to increase air moisture. This may keep secretions moist and make it easier to breathe.  Drink enough water and fluids to keep your urine clear or pale yellow.  Rest as needed.  Return to work when your temperature has returned to normal or as your caregiver advises. You may need to stay home longer to avoid infecting others. You can also use a face mask and careful hand washing to prevent spread of the virus. SEEK MEDICAL CARE IF:   After the first few days, you feel you are getting worse rather than better.  You need your caregiver's advice about medicines to control symptoms.  You develop chills, worsening shortness of breath, or brown or red sputum. These  may be signs of pneumonia.  You develop yellow or brown nasal discharge or pain in the face, especially when you bend forward. These may be signs of sinusitis.  You develop a fever, swollen neck glands, pain with swallowing, or white areas in the back of your throat. These may be signs of strep throat. SEEK IMMEDIATE MEDICAL CARE IF:   You have a fever.  You develop severe or persistent headache, ear pain, sinus pain, or chest pain.  You develop wheezing, a prolonged cough, cough up blood, or have a change in your usual mucus (if you have chronic lung disease).  You develop sore muscles or a stiff neck. Document Released: 07/20/2000 Document Revised:  04/18/2011 Document Reviewed: 05/01/2013 [facility] Patient Information 2015 [place], [place]. This information is not intended to replace advice given to you by your health care provider. Make sure you discuss any questions you have with your health care provider.

## 2014-02-03 NOTE — ED Provider Notes (Signed)
CSN: [id number]     Arrival date & time 02/03/14  1212 History   First MD Initiated Contact with Patient 02/03/14 1242     Chief Complaint  Patient presents with  . Sore Throat   (Consider location/radiation/quality/duration/timing/severity/associated sxs/prior Treatment) HPI Comments: 68 year old male complaining of a sore throat for 3 weeks. It is associated with persistent PND. Denies fever or earache.   Past Medical History  Diagnosis Date  . Allergic rhinitis   . Asthmatic bronchitis   . Chest wall pain 2005  . GERD (gastroesophageal reflux disease)   . History of colonic polyps   . Renal calculus   . Headache(784.0)   . Attention deficit disorder   . Anxiety    Past Surgical History  Procedure Laterality Date  . Appendectomy  1959  . Cholecystectomy  2005  . Tonsillectomy  1959   Family History  Problem Relation Age of Onset  . Colon cancer Neg Hx   . Stomach cancer Neg Hx   . Esophageal cancer Neg Hx   . Rectal cancer Neg Hx    History  Substance Use Topics  . Smoking status: Former Smoker -- 1.00 packs/day for 16 years    Quit date: 02/07/1977  . Smokeless tobacco: Never Used  . Alcohol Use: Yes     Comment: rare use    Review of Systems  Constitutional: Negative.  Negative for fever.  HENT: Positive for postnasal drip and sore throat. Negative for congestion and ear pain.   Respiratory: Positive for cough. Negative for shortness of breath and wheezing.   Cardiovascular: Negative.   Gastrointestinal: Negative.     Allergies  Codeine; Penicillins; and Tetracycline  Home Medications   Prior to Admission medications   Medication Sig Start Date End Date Taking? Authorizing Provider  omeprazole (PRILOSEC) 20 MG capsule Take 20 mg by mouth daily.     Yes Historical Provider, MD  aspirin 325 MG tablet Take 325 mg by mouth daily.      Historical Provider, MD  cetirizine (ZYRTEC) 10 MG tablet Take 10 mg by mouth daily.    Historical Provider, MD   guaiFENesin (MUCINEX) 600 MG 12 hr tablet Take 1,200 mg by mouth 2 (two) times daily as needed.     Historical Provider, MD  Multiple Vitamin (MULTIVITAMIN) tablet Take 1 tablet by mouth daily.    Historical Provider, MD   BP 146/86 mmHg  Pulse 57  Temp(Src) 98.5 F (36.9 C) (Oral)  Resp 16  SpO2 95% Physical Exam  Constitutional: He is oriented to person, place, and time. He appears well-developed and well-nourished. No distress.  HENT:  Mouth/Throat: No oropharyngeal exudate.  Bilateral TMs are normal Oropharynx with minor erythema, minor cobblestoning and moderate amount of clear PND.  Eyes: Conjunctivae and EOM are normal.  Neck: Normal range of motion. Neck supple.  Cardiovascular: Normal rate, regular rhythm and normal heart sounds.   Pulmonary/Chest: Effort normal and breath sounds normal. No respiratory distress.  Lymphadenopathy:    He has no cervical adenopathy.  Neurological: He is alert and oriented to person, place, and time.  Skin: Skin is warm.  Nursing note and vitals reviewed.   ED Course  Procedures (including critical care time) Labs Review Labs Reviewed  POCT RAPID STREP A ([place])   Results for orders placed or performed during the hospital encounter of 02/03/14  POCT rapid strep A [facility])  Result Value Ref Range   Streptococcus, Group A Screen (Direct) NEGATIVE  NEGATIVE    Imaging Review No results found.   MDM   1. Sore throat   2. PND (post-nasal drip)    sore throat is due to the PND. No evidence of bacterial infection.   May continue the zyrtec. Add Chlor Trimeton 2 mg as directed. Lots of fluids Cepacol lozenges   Janne Napoleon, NP 02/03/14 1306

## 2014-02-05 LAB — CULTURE, GROUP A STREP

## 2014-04-15 ENCOUNTER — Ambulatory Visit: Payer: Medicare Other | Admitting: Internal Medicine

## 2014-04-22 ENCOUNTER — Ambulatory Visit: Payer: Medicare Other | Admitting: Internal Medicine

## 2014-05-13 ENCOUNTER — Encounter: Payer: Self-pay | Admitting: Internal Medicine

## 2014-05-13 ENCOUNTER — Ambulatory Visit (INDEPENDENT_AMBULATORY_CARE_PROVIDER_SITE_OTHER): Payer: Medicare Other | Admitting: Internal Medicine

## 2014-05-13 VITALS — BP 140/80 | HR 58 | Temp 98.4°F | Wt 228.0 lb

## 2014-05-13 DIAGNOSIS — K219 Gastro-esophageal reflux disease without esophagitis: Secondary | ICD-10-CM | POA: Diagnosis not present

## 2014-05-13 DIAGNOSIS — J301 Allergic rhinitis due to pollen: Secondary | ICD-10-CM

## 2014-05-13 DIAGNOSIS — F900 Attention-deficit hyperactivity disorder, predominantly inattentive type: Secondary | ICD-10-CM | POA: Diagnosis not present

## 2014-05-13 DIAGNOSIS — J45909 Unspecified asthma, uncomplicated: Secondary | ICD-10-CM

## 2014-05-13 DIAGNOSIS — L723 Sebaceous cyst: Secondary | ICD-10-CM

## 2014-05-13 DIAGNOSIS — R7301 Impaired fasting glucose: Secondary | ICD-10-CM | POA: Insufficient documentation

## 2014-05-13 DIAGNOSIS — Z23 Encounter for immunization: Secondary | ICD-10-CM

## 2014-05-13 DIAGNOSIS — F909 Attention-deficit hyperactivity disorder, unspecified type: Secondary | ICD-10-CM

## 2014-05-13 DIAGNOSIS — R7303 Prediabetes: Secondary | ICD-10-CM | POA: Insufficient documentation

## 2014-05-13 LAB — RENAL FUNCTION PANEL
ALBUMIN: 4.2 g/dL (ref 3.5–5.2)
BUN: 15 mg/dL (ref 6–23)
CALCIUM: 9.6 mg/dL (ref 8.4–10.5)
CO2: 30 mEq/L (ref 19–32)
Chloride: 105 mEq/L (ref 96–112)
Creatinine, Ser: 1.16 mg/dL (ref 0.40–1.50)
GFR: 66.9 mL/min (ref >60.00)
Glucose, Bld: 93 mg/dL (ref 70–99)
PHOSPHORUS: 3 mg/dL (ref 2.3–4.6)
Potassium: 4.6 mEq/L (ref 3.5–5.1)
Sodium: 139 mEq/L (ref 135–145)

## 2014-05-13 LAB — HEMOGLOBIN A1C: Hgb A1c MFr Bld: 6.1 % (ref 4.6–6.5)

## 2014-05-13 NOTE — Assessment & Plan Note (Signed)
Seems less severe Doing fine with OTC meds

## 2014-05-13 NOTE — Assessment & Plan Note (Signed)
No recent problems with adequate treatment of his allergies

## 2014-05-13 NOTE — Progress Notes (Signed)
Subjective:    Patient ID: Benjamin Cortez, male    DOB: 1945-02-15, 69 y.o.   MRN: [id number]  HPI Here for transfer of care from Dr Benjamin Cortez  Asthmatic bronchitis Controlled with his allergy meds No regular cough or wheezing No SOB Spring allergies are not flaring too much yet  No regular exercise Weight is stable Advised starting pattern of regular activity  Has ADHD No meds since he retired  Has GERD No problems on the medication--will get some burning if he forgets the AM med No swallowing problems  Current Outpatient Prescriptions on File Prior to Visit  Medication Sig Dispense Refill  . aspirin 325 MG tablet Take 325 mg by mouth daily.      . cetirizine (ZYRTEC) 10 MG tablet Take 10 mg by mouth daily.    Marland Kitchen guaiFENesin (MUCINEX) 600 MG 12 hr tablet Take 1,200 mg by mouth 2 (two) times daily as needed.     . Multiple Vitamin (MULTIVITAMIN) tablet Take 1 tablet by mouth daily.    Marland Kitchen omeprazole (PRILOSEC) 20 MG capsule Take 20 mg by mouth daily.       No current facility-administered medications on file prior to visit.    Allergies  Allergen Reactions  . Codeine     REACTION: nausea  . Penicillins     As a child--rash  . Tetracycline     Rash, blisters    Past Medical History  Diagnosis Date  . Allergic rhinitis   . Asthmatic bronchitis     controlled with meds  . GERD (gastroesophageal reflux disease)   . History of colonic polyps     Benjamin Cortez  . Renal calculus   . Attention deficit disorder     Meds in past--stopped with retirement  . Anxiety     work related    Past Surgical History  Procedure Laterality Date  . Appendectomy  1959  . Cholecystectomy  2005  . Tonsillectomy  1959  . Kidney stone surgery      Basket extraction    Family History  Problem Relation Age of Onset  . Colon cancer Neg Hx   . Stomach cancer Neg Hx   . Esophageal cancer Neg Hx   . Rectal cancer Neg Hx     History   Social History  . Marital Status: Married    Spouse  Name: N/A  . Number of Children: 2  . Years of Education: N/A   Occupational History  . Scientist, clinical (histocompatibility and immunogenetics)     retired 2014   Social History Main Topics  . Smoking status: Former Smoker -- 1.00 packs/day for 16 years    Quit date: 02/07/1977  . Smokeless tobacco: Never Used  . Alcohol Use: Yes     Comment: rare use  . Drug Use: No  . Sexual Activity: Not on file   Other Topics Concern  . Not on file   Social History Narrative   No living will   Would want wife to make health care decisions   Would accept resuscitation attempts   Not sure about tube feeds   Review of Systems  Sleeps okay Bowels are okay Appetite is good No chest pain  Palpitations in past---will just stop and it resolves. May occur when working hard (like with slight dizziness) No falls No depression or anhedonia Chronic bilateral tinnitus Has cyst on neck    Objective:   Physical Exam  Constitutional: He appears well-developed and well-nourished. No distress.  HENT:  Mouth/Throat: Oropharynx  is clear and moist. No oropharyngeal exudate.  Neck: Normal range of motion. Neck supple. No thyromegaly present.  Cardiovascular: Normal rate, regular rhythm, normal heart sounds and intact distal pulses.  Exam reveals no gallop.   No murmur heard. Pulmonary/Chest: Effort normal and breath sounds normal. No respiratory distress. He has no wheezes. He has no rales.  Abdominal: Soft. There is no tenderness.  Musculoskeletal: He exhibits no edema or tenderness.  Lymphadenopathy:    He has no cervical adenopathy.  Skin:  ~1cm cyst on upper left back--mild irritation  Psychiatric: He has a normal mood and affect. His behavior is normal.          Assessment & Plan:

## 2014-05-13 NOTE — Assessment & Plan Note (Signed)
Ongoing symptoms if he skips doses Will continue PPI

## 2014-05-13 NOTE — Addendum Note (Signed)
Addended by: Emelia Salisbury C on: 05/13/2014 12:50 PM   Modules accepted: Orders

## 2014-05-13 NOTE — Assessment & Plan Note (Signed)
Has not been a problem since he retired No longer needs meds

## 2014-05-13 NOTE — Assessment & Plan Note (Signed)
Info on lifestyle given Will recheck labs

## 2014-05-13 NOTE — Assessment & Plan Note (Signed)
This bothers him with his shirt collars Will set up surgery visit

## 2014-05-13 NOTE — Progress Notes (Signed)
Pre visit review using our clinic review tool, if applicable. No additional management support is needed unless otherwise documented below in the visit note. 

## 2014-05-13 NOTE — Patient Instructions (Signed)
DASH Eating Plan DASH stands for "Dietary Approaches to Stop Hypertension." The DASH eating plan is a healthy eating plan that has been shown to reduce high blood pressure (hypertension). Additional health benefits may include reducing the risk of type 2 diabetes mellitus, heart disease, and stroke. The DASH eating plan may also help with weight loss. WHAT DO I NEED TO KNOW ABOUT THE DASH EATING PLAN? For the DASH eating plan, you will follow these general guidelines:  Choose foods with a percent daily value for sodium of less than 5% (as listed on the food label).  Use salt-free seasonings or herbs instead of table salt or sea salt.  Check with your health care provider or pharmacist before using salt substitutes.  Eat lower-sodium products, often labeled as "lower sodium" or "no salt added."  Eat fresh foods.  Eat more vegetables, fruits, and low-fat dairy products.  Choose whole grains. Look for the word "whole" as the first word in the ingredient list.  Choose fish and skinless chicken or turkey more often than red meat. Limit fish, poultry, and meat to 6 oz (170 g) each day.  Limit sweets, desserts, sugars, and sugary drinks.  Choose heart-healthy fats.  Limit cheese to 1 oz (28 g) per day.  Eat more home-cooked food and less restaurant, buffet, and fast food.  Limit fried foods.  Cook foods using methods other than frying.  Limit canned vegetables. If you do use them, rinse them well to decrease the sodium.  When eating at a restaurant, ask that your food be prepared with less salt, or no salt if possible. WHAT FOODS CAN I EAT? Seek help from a dietitian for individual calorie needs. Grains Whole grain or whole wheat bread. Brown rice. Whole grain or whole wheat pasta. Quinoa, bulgur, and whole grain cereals. Low-sodium cereals. Corn or whole wheat flour tortillas. Whole grain cornbread. Whole grain crackers. Low-sodium crackers. Vegetables Fresh or frozen vegetables  (raw, steamed, roasted, or grilled). Low-sodium or reduced-sodium tomato and vegetable juices. Low-sodium or reduced-sodium tomato sauce and paste. Low-sodium or reduced-sodium canned vegetables.  Fruits All fresh, canned (in natural juice), or frozen fruits. Meat and Other Protein Products Ground beef (85% or leaner), grass-fed beef, or beef trimmed of fat. Skinless chicken or turkey. Ground chicken or turkey. Pork trimmed of fat. All fish and seafood. Eggs. Dried beans, peas, or lentils. Unsalted nuts and seeds. Unsalted canned beans. Dairy Low-fat dairy products, such as skim or 1% milk, 2% or reduced-fat cheeses, low-fat ricotta or cottage cheese, or plain low-fat yogurt. Low-sodium or reduced-sodium cheeses. Fats and Oils Tub margarines without trans fats. Light or reduced-fat mayonnaise and salad dressings (reduced sodium). Avocado. Safflower, olive, or canola oils. Natural peanut or almond butter. Other Unsalted popcorn and pretzels. The items listed above may not be a complete list of recommended foods or beverages. Contact your dietitian for more options. WHAT FOODS ARE NOT RECOMMENDED? Grains White bread. White pasta. White rice. Refined cornbread. Bagels and croissants. Crackers that contain trans fat. Vegetables Creamed or fried vegetables. Vegetables in a cheese sauce. Regular canned vegetables. Regular canned tomato sauce and paste. Regular tomato and vegetable juices. Fruits Dried fruits. Canned fruit in light or heavy syrup. Fruit juice. Meat and Other Protein Products Fatty cuts of meat. Ribs, chicken wings, bacon, sausage, bologna, salami, chitterlings, fatback, hot dogs, bratwurst, and packaged luncheon meats. Salted nuts and seeds. Canned beans with salt. Dairy Whole or 2% milk, cream, half-and-half, and cream cheese. Whole-fat or sweetened yogurt. Full-fat   cheeses or blue cheese. Nondairy creamers and whipped toppings. Processed cheese, cheese spreads, or cheese  curds. Condiments Onion and garlic salt, seasoned salt, table salt, and sea salt. Canned and packaged gravies. Worcestershire sauce. Tartar sauce. Barbecue sauce. Teriyaki sauce. Soy sauce, including reduced sodium. Steak sauce. Fish sauce. Oyster sauce. Cocktail sauce. Horseradish. Ketchup and mustard. Meat flavorings and tenderizers. Bouillon cubes. Hot sauce. Tabasco sauce. Marinades. Taco seasonings. Relishes. Fats and Oils Butter, stick margarine, lard, shortening, ghee, and bacon fat. Coconut, palm kernel, or palm oils. Regular salad dressings. Other Pickles and olives. Salted popcorn and pretzels. The items listed above may not be a complete list of foods and beverages to avoid. Contact your dietitian for more information. WHERE CAN I FIND MORE INFORMATION? National Heart, Lung, and Blood Institute: www.nhlbi.nih.gov/health/health-topics/topics/dash/ Document Released: 01/13/2011 Document Revised: 06/10/2013 Document Reviewed: 11/28/2012 [employer] Patient Information 2015 [employer]. This information is not intended to replace advice given to you by your health care provider. Make sure you discuss any questions you have with your health care provider. Exercise to Lose Weight Exercise and a healthy diet may help you lose weight. Your doctor may suggest specific exercises. EXERCISE IDEAS AND TIPS  Choose low-cost things you enjoy doing, such as walking, bicycling, or exercising to workout videos.  Take stairs instead of the elevator.  Walk during your lunch break.  Park your car further away from work or school.  Go to a gym or an exercise class.  Start with 5 to 10 minutes of exercise each day. Build up to 30 minutes of exercise 4 to 6 days a week.  Wear shoes with good support and comfortable clothes.  Stretch before and after working out.  Work out until you breathe harder and your heart beats faster.  Drink extra water when you exercise.  Do not do so much that you  hurt yourself, feel dizzy, or get very short of breath. Exercises that burn about 150 calories:  Running 1  miles in 15 minutes.  Playing volleyball for 45 to 60 minutes.  Washing and waxing a car for 45 to 60 minutes.  Playing touch football for 45 minutes.  Walking 1  miles in 35 minutes.  Pushing a stroller 1  miles in 30 minutes.  Playing basketball for 30 minutes.  Raking leaves for 30 minutes.  Bicycling 5 miles in 30 minutes.  Walking 2 miles in 30 minutes.  Dancing for 30 minutes.  Shoveling snow for 15 minutes.  Swimming laps for 20 minutes.  Walking up stairs for 15 minutes.  Bicycling 4 miles in 15 minutes.  Gardening for 30 to 45 minutes.  Jumping rope for 15 minutes.  Washing windows or floors for 45 to 60 minutes. Document Released: 02/26/2010 Document Revised: 04/18/2011 Document Reviewed: 02/26/2010 [employer] Patient Information 2015 [employer]. This information is not intended to replace advice given to you by your health care provider. Make sure you discuss any questions you have with your health care provider.  

## 2014-05-28 ENCOUNTER — Encounter: Payer: Self-pay | Admitting: Internal Medicine

## 2014-05-28 ENCOUNTER — Ambulatory Visit (INDEPENDENT_AMBULATORY_CARE_PROVIDER_SITE_OTHER): Payer: Medicare Other | Admitting: Internal Medicine

## 2014-05-28 VITALS — BP 150/70 | HR 60 | Temp 98.4°F | Wt 231.0 lb

## 2014-05-28 DIAGNOSIS — L723 Sebaceous cyst: Secondary | ICD-10-CM | POA: Diagnosis not present

## 2014-05-28 NOTE — Progress Notes (Signed)
Pre visit review using our clinic review tool, if applicable. No additional management support is needed unless otherwise documented below in the visit note. 

## 2014-05-28 NOTE — Progress Notes (Signed)
   Subjective:    Patient ID: Benjamin Cortez, male    DOB: [birth year], 69 y.o.   MRN: [id number]  HPI Here for removal of cyst on upper back   Review of Systems     Objective:   Physical Exam        Assessment & Plan:

## 2014-05-28 NOTE — Assessment & Plan Note (Signed)
Has been painful to him  PROCEDURE Sterile prep 3cc 1% lidocaine with epi Incision over cyst Blunt dissection to remove--seemed to be in entirety Cyst was ~53mm and incision about 27mm EBL 3-87ml Closed with 2 each 4-0 vicryl and steristrips DSD Discussed home care plans

## 2014-06-06 ENCOUNTER — Encounter: Payer: Self-pay | Admitting: Internal Medicine

## 2014-11-25 ENCOUNTER — Encounter: Payer: Self-pay | Admitting: Cardiology

## 2015-01-14 ENCOUNTER — Ambulatory Visit (INDEPENDENT_AMBULATORY_CARE_PROVIDER_SITE_OTHER): Payer: Medicare Other | Admitting: Internal Medicine

## 2015-01-14 ENCOUNTER — Encounter: Payer: Self-pay | Admitting: Internal Medicine

## 2015-01-14 VITALS — BP 148/80 | HR 72 | Temp 97.8°F | Wt 219.0 lb

## 2015-01-14 DIAGNOSIS — R059 Cough, unspecified: Secondary | ICD-10-CM

## 2015-01-14 DIAGNOSIS — R05 Cough: Secondary | ICD-10-CM | POA: Diagnosis not present

## 2015-01-14 MED ORDER — AZITHROMYCIN 250 MG PO TABS: ORAL_TABLET | ORAL | Status: DC

## 2015-01-14 NOTE — Progress Notes (Signed)
HPI  Pt presents to the clinic today with c/o cough and chest congestion. This started 1 month ago. The cough is productive of yellow mucous. He has had associated ear fullness. He denies fever but has felt fatigued. He has tried Mucinex and Dextromethorphan with some relief. He does have a history of allergies. He has had sick contacts diagnosed with pneumonia.  Review of Systems      Past Medical History  Diagnosis Date  . Allergic rhinitis   . Asthmatic bronchitis     controlled with meds  . GERD (gastroesophageal reflux disease)   . History of colonic polyps     Benjamin Cortez  . Renal calculus   . Attention deficit disorder     Meds in past--stopped with retirement  . Anxiety     work related  . Ocular migraine     Family History  Problem Relation Age of Onset  . Colon cancer Neg Hx   . Stomach cancer Neg Hx   . Esophageal cancer Neg Hx   . Rectal cancer Neg Hx     Social History   Social History  . Marital Status: Married    Spouse Name: N/A  . Number of Children: 2  . Years of Education: N/A   Occupational History  . Scientist, clinical (histocompatibility and immunogenetics)     retired 2014   Social History Main Topics  . Smoking status: Former Smoker -- 1.00 packs/day for 16 years    Quit date: 02/07/1977  . Smokeless tobacco: Never Used  . Alcohol Use: Yes     Comment: rare use  . Drug Use: No  . Sexual Activity: Not on file   Other Topics Concern  . Not on file   Social History Narrative   No living will   Would want wife to make health care decisions   Would accept resuscitation attempts   Not sure about tube feeds    Allergies  Allergen Reactions  . Codeine     REACTION: nausea  . Penicillins     As a child--rash  . Tetracycline     Rash, blisters     Constitutional: Positive headache, fatigue. Denies fever or abrupt weight changes.  HEENT:  Positive ear fullness. Denies eye redness, eye pain, pressure behind the eyes, facial pain, nasal congestion, ear pain, ringing in the  ears, wax buildup, runny nose or sore throat. Respiratory: Positive cough. Denies difficulty breathing or shortness of breath.  Cardiovascular: Denies chest pain, chest tightness, palpitations or swelling in the hands or feet.   No other specific complaints in a complete review of systems (except as listed in HPI above).  Objective:   BP 148/80 mmHg  Pulse 72  Temp(Src) 97.8 F (36.6 C) (Oral)  Wt 219 lb (99.338 kg)  SpO2 98%  Wt Readings from Last 3 Encounters:  01/14/15 219 lb (99.338 kg)  05/28/14 231 lb (104.781 kg)  05/13/14 228 lb (103.42 kg)     General: Appears his stated age, ill appeaing in NAD. HEENT: Head: normal shape and size, no sinus tenderness noted; Eyes: sclera white, no icterus, conjunctiva pink; Ears: Tm's gray and intact, normal light reflex; Nose: mucosa pink and moist, septum midline; Throat/Mouth: + PND. Teeth present, mucosa erythematous and moist, no exudate noted, no lesions or ulcerations noted.  Neck: No cervical lymphadenopathy.  Cardiovascular: Normal rate and rhythm. S1,S2 noted.  No murmur, rubs or gallops noted.  Pulmonary/Chest: Normal effort and positive vesicular breath sounds, diminished in the LLL. No respiratory  distress. No wheezes, rales or ronchi noted.      Assessment & Plan:   Cough:  Get some rest and drink plenty of water Given the duration of symptoms, will treat with abx eRx for Azithromax x 5 days Continue OTC cough syrup  RTC as needed or if symptoms persist.

## 2015-01-14 NOTE — Patient Instructions (Signed)

## 2015-01-14 NOTE — Progress Notes (Signed)
Pre visit review using our clinic review tool, if applicable. No additional management support is needed unless otherwise documented below in the visit note. 

## 2015-01-26 ENCOUNTER — Encounter: Payer: Self-pay | Admitting: Family Medicine

## 2015-01-26 ENCOUNTER — Ambulatory Visit (INDEPENDENT_AMBULATORY_CARE_PROVIDER_SITE_OTHER): Payer: Medicare Other | Admitting: Family Medicine

## 2015-01-26 VITALS — BP 116/74 | HR 64 | Temp 97.6°F | Wt 221.8 lb

## 2015-01-26 DIAGNOSIS — R05 Cough: Secondary | ICD-10-CM | POA: Diagnosis not present

## 2015-01-26 DIAGNOSIS — R059 Cough, unspecified: Secondary | ICD-10-CM

## 2015-01-26 DIAGNOSIS — H524 Presbyopia: Secondary | ICD-10-CM | POA: Diagnosis not present

## 2015-01-26 DIAGNOSIS — H04123 Dry eye syndrome of bilateral lacrimal glands: Secondary | ICD-10-CM | POA: Diagnosis not present

## 2015-01-26 DIAGNOSIS — H2513 Age-related nuclear cataract, bilateral: Secondary | ICD-10-CM | POA: Diagnosis not present

## 2015-01-26 MED ORDER — PREDNISONE 10 MG PO TABS: ORAL_TABLET | ORAL | Status: DC

## 2015-01-26 MED ORDER — CEFDINIR 300 MG PO CAPS: 300.0000 mg | ORAL_CAPSULE | Freq: Two times a day (BID) | ORAL | Status: AC

## 2015-01-26 MED ORDER — ALBUTEROL SULFATE HFA 108 (90 BASE) MCG/ACT IN AERS: 2.0000 | INHALATION_SPRAY | Freq: Four times a day (QID) | RESPIRATORY_TRACT | Status: DC | PRN

## 2015-01-26 NOTE — Progress Notes (Signed)
Pre visit review using our clinic review tool, if applicable. No additional management support is needed unless otherwise documented below in the visit note.  Sick for 6 weeks overall.  Cough got worse, started zmax, was coughing up discolored sputum before that round of abx.   He felt better and then got sick again.  More sputum now, thicker.  Back on mucinex.  Sputum is discolored again.  Wife has PNA.   No FCNAVD but did have a sweat yesterday and still generally doesn't feel well. Fatigued.   Wheeze last night.   He can tolerate cephalosporins w/o ADE.  dw pt.   Meds, vitals, and allergies reviewed.   ROS: See HPI.  Otherwise, noncontributory.  nad but looks like he doesn't feel well. Tm w/o erythema Nasal exam stuffy.  OP MMM with mild cobblestoning Neck supple no LA rrr Ctab, not wheezing at time of exam.  Cough noted abd soft, not ttp Ext w/o edema

## 2015-01-26 NOTE — Patient Instructions (Signed)
Start cefdinir with prednisone.  Use the inhaler if needed.  Get some rest.  Take care.  Glad to see you.  Update [place] as needed.

## 2015-01-27 DIAGNOSIS — R05 Cough: Secondary | ICD-10-CM | POA: Insufficient documentation

## 2015-01-27 DIAGNOSIS — R059 Cough, unspecified: Secondary | ICD-10-CM | POA: Insufficient documentation

## 2015-01-27 NOTE — Assessment & Plan Note (Signed)
Presumed bronchitis.  Would treat given the duration.  Start cefdinir with prednisone. He can tolerate cefdinir, d/w pt.   Still okay for outpatient f/u.  Update [place] as needed.  He agrees.  Rest and fluids o/w.

## 2015-05-15 ENCOUNTER — Ambulatory Visit (INDEPENDENT_AMBULATORY_CARE_PROVIDER_SITE_OTHER): Payer: Medicare Other | Admitting: Internal Medicine

## 2015-05-15 ENCOUNTER — Encounter: Payer: Self-pay | Admitting: Internal Medicine

## 2015-05-15 VITALS — BP 130/90 | HR 58 | Temp 97.7°F | Ht 67.5 in | Wt 221.0 lb

## 2015-05-15 DIAGNOSIS — K219 Gastro-esophageal reflux disease without esophagitis: Secondary | ICD-10-CM

## 2015-05-15 DIAGNOSIS — N400 Enlarged prostate without lower urinary tract symptoms: Secondary | ICD-10-CM

## 2015-05-15 DIAGNOSIS — Z7189 Other specified counseling: Secondary | ICD-10-CM

## 2015-05-15 DIAGNOSIS — R7301 Impaired fasting glucose: Secondary | ICD-10-CM

## 2015-05-15 DIAGNOSIS — Z0000 Encounter for general adult medical examination without abnormal findings: Secondary | ICD-10-CM

## 2015-05-15 DIAGNOSIS — R208 Other disturbances of skin sensation: Secondary | ICD-10-CM | POA: Diagnosis not present

## 2015-05-15 DIAGNOSIS — R200 Anesthesia of skin: Secondary | ICD-10-CM | POA: Insufficient documentation

## 2015-05-15 DIAGNOSIS — J45909 Unspecified asthma, uncomplicated: Secondary | ICD-10-CM

## 2015-05-15 LAB — CBC WITH DIFFERENTIAL/PLATELET
BASOS PCT: 0.4 % (ref 0.0–3.0)
Basophils Absolute: 0 10*3/uL (ref 0.0–0.1)
Eosinophils Absolute: 0.1 10*3/uL (ref 0.0–0.7)
Eosinophils Relative: 2.4 % (ref 0.0–5.0)
HEMATOCRIT: 47.5 % (ref 39.0–52.0)
Hemoglobin: 16.3 g/dL (ref 13.0–17.0)
LYMPHS PCT: 26.6 % (ref 12.0–46.0)
Lymphs Abs: 1.4 10*3/uL (ref 0.7–4.0)
MCHC: 34.3 g/dL (ref 30.0–36.0)
MCV: 92.8 fl (ref 78.0–100.0)
Monocytes Absolute: 0.6 10*3/uL (ref 0.1–1.0)
Monocytes Relative: 10.7 % (ref 3.0–12.0)
NEUTROS ABS: 3.2 10*3/uL (ref 1.4–7.7)
Neutrophils Relative %: 59.9 % (ref 43.0–77.0)
PLATELETS: 244 10*3/uL (ref 150.0–400.0)
RBC: 5.11 Mil/uL (ref 4.22–5.81)
RDW: 12.6 % (ref 11.5–15.5)
WBC: 5.4 10*3/uL (ref 4.0–10.5)

## 2015-05-15 LAB — COMPREHENSIVE METABOLIC PANEL
ALT: 28 U/L (ref 0–53)
AST: 23 U/L (ref 0–37)
Albumin: 4.2 g/dL (ref 3.5–5.2)
Alkaline Phosphatase: 96 U/L (ref 39–117)
BUN: 13 mg/dL (ref 6–23)
CALCIUM: 9.5 mg/dL (ref 8.4–10.5)
CHLORIDE: 104 meq/L (ref 96–112)
CO2: 30 meq/L (ref 19–32)
Creatinine, Ser: 1.14 mg/dL (ref 0.40–1.50)
GFR: 68.05 mL/min (ref >60.00)
Glucose, Bld: 104 mg/dL — ABNORMAL HIGH (ref 70–99)
POTASSIUM: 4.5 meq/L (ref 3.5–5.1)
Sodium: 139 mEq/L (ref 135–145)
Total Bilirubin: 0.9 mg/dL (ref 0.2–1.2)
Total Protein: 7 g/dL (ref 6.0–8.3)

## 2015-05-15 LAB — VITAMIN B12: Vitamin B-12: 428 pg/mL (ref 211–911)

## 2015-05-15 LAB — PSA: PSA: 1.19 ng/mL (ref 0.10–4.00)

## 2015-05-15 LAB — T4, FREE: Free T4: 0.84 ng/dL (ref 0.60–1.60)

## 2015-05-15 LAB — HEMOGLOBIN A1C: Hgb A1c MFr Bld: 6.1 % (ref 4.6–6.5)

## 2015-05-15 NOTE — Assessment & Plan Note (Signed)
Quiet with allergy meds only If worsens, could consider montelukast

## 2015-05-15 NOTE — Assessment & Plan Note (Signed)
Needs the PPI to control symptoms

## 2015-05-15 NOTE — Patient Instructions (Signed)
DASH Eating Plan  DASH stands for "Dietary Approaches to Stop Hypertension." The DASH eating plan is a healthy eating plan that has been shown to reduce high blood pressure (hypertension). Additional health benefits may include reducing the risk of type 2 diabetes mellitus, heart disease, and stroke. The DASH eating plan may also help with weight loss.  WHAT DO I NEED TO KNOW ABOUT THE DASH EATING PLAN?  For the DASH eating plan, you will follow these general guidelines:  · Choose foods with a percent daily value for sodium of less than 5% (as listed on the food label).  · Use salt-free seasonings or herbs instead of table salt or sea salt.  · Check with your health care provider or pharmacist before using salt substitutes.  · Eat lower-sodium products, often labeled as "lower sodium" or "no salt added."  · Eat fresh foods.  · Eat more vegetables, fruits, and low-fat dairy products.  · Choose whole grains. Look for the word "whole" as the first word in the ingredient list.  · Choose fish and skinless chicken or turkey more often than red meat. Limit fish, poultry, and meat to 6 oz (170 g) each day.  · Limit sweets, desserts, sugars, and sugary drinks.  · Choose heart-healthy fats.  · Limit cheese to 1 oz (28 g) per day.  · Eat more home-cooked food and less restaurant, buffet, and fast food.  · Limit fried foods.  · Cook foods using methods other than frying.  · Limit canned vegetables. If you do use them, rinse them well to decrease the sodium.  · When eating at a restaurant, ask that your food be prepared with less salt, or no salt if possible.  WHAT FOODS CAN I EAT?  Seek help from a dietitian for individual calorie needs.  Grains  Whole grain or whole wheat bread. Brown rice. Whole grain or whole wheat pasta. Quinoa, bulgur, and whole grain cereals. Low-sodium cereals. Corn or whole wheat flour tortillas. Whole grain cornbread. Whole grain crackers. Low-sodium crackers.  Vegetables  Fresh or frozen vegetables  (raw, steamed, roasted, or grilled). Low-sodium or reduced-sodium tomato and vegetable juices. Low-sodium or reduced-sodium tomato sauce and paste. Low-sodium or reduced-sodium canned vegetables.   Fruits  All fresh, canned (in natural juice), or frozen fruits.  Meat and Other Protein Products  Ground beef (85% or leaner), grass-fed beef, or beef trimmed of fat. Skinless chicken or turkey. Ground chicken or turkey. Pork trimmed of fat. All fish and seafood. Eggs. Dried beans, peas, or lentils. Unsalted nuts and seeds. Unsalted canned beans.  Dairy  Low-fat dairy products, such as skim or 1% milk, 2% or reduced-fat cheeses, low-fat ricotta or cottage cheese, or plain low-fat yogurt. Low-sodium or reduced-sodium cheeses.  Fats and Oils  Tub margarines without trans fats. Light or reduced-fat mayonnaise and salad dressings (reduced sodium). Avocado. Safflower, olive, or canola oils. Natural peanut or almond butter.  Other  Unsalted popcorn and pretzels.  The items listed above may not be a complete list of recommended foods or beverages. Contact your dietitian for more options.  WHAT FOODS ARE NOT RECOMMENDED?  Grains  White bread. White pasta. White rice. Refined cornbread. Bagels and croissants. Crackers that contain trans fat.  Vegetables  Creamed or fried vegetables. Vegetables in a cheese sauce. Regular canned vegetables. Regular canned tomato sauce and paste. Regular tomato and vegetable juices.  Fruits  Dried fruits. Canned fruit in light or heavy syrup. Fruit juice.  Meat and Other Protein   Products  Fatty cuts of meat. Ribs, chicken wings, bacon, sausage, bologna, salami, chitterlings, fatback, hot dogs, bratwurst, and packaged luncheon meats. Salted nuts and seeds. Canned beans with salt.  Dairy  Whole or 2% milk, cream, half-and-half, and cream cheese. Whole-fat or sweetened yogurt. Full-fat cheeses or blue cheese. Nondairy creamers and whipped toppings. Processed cheese, cheese spreads, or cheese  curds.  Condiments  Onion and garlic salt, seasoned salt, table salt, and sea salt. Canned and packaged gravies. Worcestershire sauce. Tartar sauce. Barbecue sauce. Teriyaki sauce. Soy sauce, including reduced sodium. Steak sauce. Fish sauce. Oyster sauce. Cocktail sauce. Horseradish. Ketchup and mustard. Meat flavorings and tenderizers. Bouillon cubes. Hot sauce. Tabasco sauce. Marinades. Taco seasonings. Relishes.  Fats and Oils  Butter, stick margarine, lard, shortening, ghee, and bacon fat. Coconut, palm kernel, or palm oils. Regular salad dressings.  Other  Pickles and olives. Salted popcorn and pretzels.  The items listed above may not be a complete list of foods and beverages to avoid. Contact your dietitian for more information.  WHERE CAN I FIND MORE INFORMATION?  National Heart, Lung, and Blood Institute: www.nhlbi.nih.gov/health/health-topics/topics/dash/     This information is not intended to replace advice given to you by your health care provider. Make sure you discuss any questions you have with your health care provider.     Document Released: 01/13/2011 Document Revised: 02/14/2014 Document Reviewed: 11/28/2012  Elsevier Interactive Patient Education ©2016 [employer]vier Inc.

## 2015-05-15 NOTE — Assessment & Plan Note (Signed)
Mild symptoms No Rx for now 

## 2015-05-15 NOTE — Assessment & Plan Note (Signed)
I have personally reviewed the Medicare Annual Wellness questionnaire and have noted 1. The patient's medical and social history 2. Their use of alcohol, tobacco or illicit drugs 3. Their current medications and supplements 4. The patient's functional ability including ADL's, fall risks, home safety risks and hearing or visual             impairment. 5. Diet and physical activities 6. Evidence for depression or mood disorders  The patients weight, height, BMI and visual acuity have been recorded in the chart I have made referrals, counseling and provided education to the patient based review of the above and I have provided the pt with a written personalized care plan for preventive services.  I have provided you with a copy of your personalized plan for preventive services. Please take the time to review along with your updated medication list.  UTD on immunizations Discussed PSA--will check Colon due 2018 Discussed lifestyle

## 2015-05-15 NOTE — Addendum Note (Signed)
Addended by: Marchia Bond on: 05/15/2015 03:23 PM   Modules accepted: Miquel Dunn

## 2015-05-15 NOTE — Assessment & Plan Note (Signed)
Will check labs

## 2015-05-15 NOTE — Progress Notes (Signed)
Subjective:    Patient ID: Benjamin Cortez, male    DOB: 1945/09/29, 70 y.o.   MRN: [id number]  HPI Here for Medicare wellness and follow up of chronic health issues Reviewed form and advanced directives Reviewed other doctors No tobacco or alcohol No set exercise--- but does yard work, busy watching grandkids, etc. Discussed Wife not careful with cooking---big amounts and lots of carbs No falls No depression or anhedonia Vision is fine. Mild hearing loss and tinnitus. Considering hearing aides at [place] with instrumental ADLs No apparent cognitive problems  Did finally recover from cough and infection Allergy meds have controlled his asthma for the most part---had been allergy triggered Doesn't spend time in the woods like before (so less pollen exposure) No regular wheezing or cough  Acid reflux controlled with his PPI Tries to be careful with diet No swallowing problems  Has transient shooting pains in feet Numbness on balls of feet This has worsened--- right > left Not all the time No weakness  Has noted a decreased urine stream--trouble getting it started Some dribbling Nocturia is unusual--limits fluids before bedtime (0-1)  Current Outpatient Prescriptions on File Prior to Visit  Medication Sig Dispense Refill  . aspirin 325 MG tablet Take 325 mg by mouth daily.      . cetirizine (ZYRTEC) 10 MG tablet Take 10 mg by mouth daily.    Marland Kitchen guaiFENesin (MUCINEX) 600 MG 12 hr tablet Take 1,200 mg by mouth 2 (two) times daily as needed.     . loratadine (CLARITIN) 10 MG tablet Take 10 mg by mouth daily.    . Multiple Vitamin (MULTIVITAMIN) tablet Take 1 tablet by mouth daily.    Marland Kitchen omeprazole (PRILOSEC) 20 MG capsule Take 20 mg by mouth daily.       No current facility-administered medications on file prior to visit.    Allergies  Allergen Reactions  . Codeine     REACTION: nausea  . Penicillins     As a child--rash  . Tetracycline     Rash, blisters     Past Medical History  Diagnosis Date  . Allergic rhinitis   . Asthmatic bronchitis     controlled with meds  . GERD (gastroesophageal reflux disease)   . History of colonic polyps     [name]  . Renal calculus   . Attention deficit disorder     Meds in past--stopped with retirement  . Anxiety     work related  . Ocular migraine     Past Surgical History  Procedure Laterality Date  . Appendectomy  1959  . Cholecystectomy  2005  . Tonsillectomy  1959  . Kidney stone surgery      Basket extraction    Family History  Problem Relation Age of Onset  . Colon cancer Neg Hx   . Stomach cancer Neg Hx   . Esophageal cancer Neg Hx   . Rectal cancer Neg Hx     Social History   Social History  . Marital Status: Married    Spouse Name: N/A  . Number of Children: 2  . Years of Education: N/A   Occupational History  . Scientist, clinical (histocompatibility and immunogenetics)     retired 2014   Social History Main Topics  . Smoking status: Former Smoker -- 1.00 packs/day for 16 years    Quit date: 02/07/1977  . Smokeless tobacco: Never Used  . Alcohol Use: Yes     Comment: rare use  . Drug Use: No  .  Sexual Activity: Not on file   Other Topics Concern  . Not on file   Social History Narrative   No living will   Would want wife to make health care decisions   Would accept resuscitation attempts   Not sure about tube feeds   Review of Systems Wears seat belt Appetite is good Weight is about the same Teeth are okay--regular with dentist No chest pain or palpitations No dizziness or syncope Mild back pain--relates to weight. No major joint issues Rash/redness on calves. Cortisone cream didn't help. Sunlight helps Bowels are fine. No blood in stool    Objective:   Physical Exam  Constitutional: He is oriented to person, place, and time. He appears well-nourished. No distress.  HENT:  Mouth/Throat: Oropharynx is clear and moist. No oropharyngeal exudate.  Neck: Normal range of motion. Neck  supple. No thyromegaly present.  Cardiovascular: Normal rate, regular rhythm, normal heart sounds and intact distal pulses.  Exam reveals no gallop.   No murmur heard. Pulmonary/Chest: Effort normal and breath sounds normal. No respiratory distress. He has no wheezes. He has no rales.  Abdominal: Soft. There is no tenderness.  Musculoskeletal: He exhibits no edema or tenderness.  Lymphadenopathy:    He has no cervical adenopathy.  Neurological: He is alert and oriented to person, place, and time.  President-- "Daisy Floro, Barack Obama, Bush" [phone] [id number] Recall 3/3  Slight change in sensation in plantar feet  Skin: No rash noted. No erythema.  Psychiatric: He has a normal mood and affect. His behavior is normal.          Assessment & Plan:

## 2015-05-15 NOTE — Addendum Note (Signed)
Addended by: Ellamae Sia on: 05/15/2015 12:38 PM   Modules accepted: Orders

## 2015-05-15 NOTE — Assessment & Plan Note (Signed)
See social history Forms given 

## 2015-05-15 NOTE — Progress Notes (Signed)
Pre visit review using our clinic review tool, if applicable. No additional management support is needed unless otherwise documented below in the visit note. 

## 2015-05-19 LAB — PROTEIN ELECTROPHORESIS
A/G RATIO SPE: 1.4 (ref 0.7–1.7)
Albumin ELP: 3.8 g/dL (ref 2.9–4.4)
Alpha 1: 0.2 g/dL (ref 0.0–0.4)
Alpha 2: 0.6 g/dL (ref 0.4–1.0)
BETA: 1.1 g/dL (ref 0.7–1.3)
GLOBULIN, TOTAL: 2.8 g/dL (ref 2.2–3.9)
Gamma Globulin: 0.9 g/dL (ref 0.4–1.8)
Total Protein: 6.6 g/dL (ref 6.0–8.5)

## 2015-05-28 ENCOUNTER — Encounter: Payer: Self-pay | Admitting: Internal Medicine

## 2015-09-04 ENCOUNTER — Ambulatory Visit (INDEPENDENT_AMBULATORY_CARE_PROVIDER_SITE_OTHER): Payer: Medicare Other | Admitting: Family Medicine

## 2015-09-04 VITALS — BP 147/76 | HR 62 | Temp 98.4°F | Ht 67.5 in | Wt 217.0 lb

## 2015-09-04 DIAGNOSIS — J019 Acute sinusitis, unspecified: Secondary | ICD-10-CM | POA: Diagnosis not present

## 2015-09-04 MED ORDER — LEVOFLOXACIN 500 MG PO TABS: 500.0000 mg | ORAL_TABLET | Freq: Every day | ORAL | 0 refills | Status: AC

## 2015-09-04 NOTE — Progress Notes (Signed)
   Subjective:    Patient ID: Benjamin Cortez, male    DOB: 1945/09/08, 70 y.o.   MRN: [id number]  HPI Here for 3 weeks of sinus pressure, PND, ST, a dry cough, and right ear pain. Using Mucinex.    Review of Systems  Constitutional: Negative.   HENT: Positive for congestion, ear pain, postnasal drip, sinus pressure and sore throat.   Eyes: Negative.   Respiratory: Positive for cough.        Objective:   Physical Exam  Constitutional: He appears well-developed and well-nourished.  HENT:  Right Ear: External ear normal.  Left Ear: External ear normal.  Nose: Nose normal.  Mouth/Throat: Oropharynx is clear and moist.  Eyes: Conjunctivae are normal.  Neck: No thyromegaly present.  Pulmonary/Chest: Effort normal and breath sounds normal.  Lymphadenopathy:    He has no cervical adenopathy.          Assessment & Plan:  Sinusitis, treat with Levaquin.  Laurey Morale, MD

## 2015-09-04 NOTE — Progress Notes (Signed)
Pre visit review using our clinic review tool, if applicable. No additional management support is needed unless otherwise documented below in the visit note. 

## 2015-11-29 DIAGNOSIS — Z23 Encounter for immunization: Secondary | ICD-10-CM | POA: Diagnosis not present

## 2016-01-28 DIAGNOSIS — H2513 Age-related nuclear cataract, bilateral: Secondary | ICD-10-CM | POA: Diagnosis not present

## 2016-01-28 DIAGNOSIS — H04123 Dry eye syndrome of bilateral lacrimal glands: Secondary | ICD-10-CM | POA: Diagnosis not present

## 2016-01-28 DIAGNOSIS — H524 Presbyopia: Secondary | ICD-10-CM | POA: Diagnosis not present

## 2016-02-08 DIAGNOSIS — M722 Plantar fascial fibromatosis: Secondary | ICD-10-CM

## 2016-02-08 DIAGNOSIS — C679 Malignant neoplasm of bladder, unspecified: Secondary | ICD-10-CM

## 2016-02-08 HISTORY — PX: COLONOSCOPY: SHX174

## 2016-02-08 HISTORY — DX: Malignant neoplasm of bladder, unspecified: C67.9

## 2016-02-08 HISTORY — DX: Plantar fascial fibromatosis: M72.2

## 2016-03-24 DIAGNOSIS — L820 Inflamed seborrheic keratosis: Secondary | ICD-10-CM | POA: Diagnosis not present

## 2016-03-24 DIAGNOSIS — L308 Other specified dermatitis: Secondary | ICD-10-CM | POA: Diagnosis not present

## 2016-05-12 ENCOUNTER — Ambulatory Visit (INDEPENDENT_AMBULATORY_CARE_PROVIDER_SITE_OTHER): Payer: PPO

## 2016-05-12 VITALS — BP 118/78 | HR 65 | Temp 98.2°F | Ht 67.0 in | Wt 223.0 lb

## 2016-05-12 DIAGNOSIS — Z1159 Encounter for screening for other viral diseases: Secondary | ICD-10-CM

## 2016-05-12 DIAGNOSIS — Z0000 Encounter for general adult medical examination without abnormal findings: Secondary | ICD-10-CM | POA: Diagnosis not present

## 2016-05-12 NOTE — Patient Instructions (Signed)
Benjamin Cortez , Thank you for taking time to come for your Medicare Wellness Visit. I appreciate your ongoing commitment to your health goals. Please review the following plan we discussed and let me know if I can assist you in the future.   These are the goals we discussed: Goals    . Weight (lb) < 200 lb (90.7 kg)          When scheduled, I am going to consult with orthopedic MD about hip and back problems in an effort to increase physical activity and to lose weight. My target weight is 190lbs.        This is a list of the screening recommended for you and due dates:  Health Maintenance  Topic Date Due  .  Hepatitis C: One time screening is recommended by Center for Disease Control  (CDC) for  adults born from [birth year] through 1963.   06/06/2016*  . Flu Shot  09/07/2016  . Colon Cancer Screening  11/22/2016  . Tetanus Vaccine  02/07/2017  . Pneumonia vaccines  Completed  *Topic was postponed. The date shown is not the original due date.   Preventive Care for Adults  A healthy lifestyle and preventive care can promote health and wellness. Preventive health guidelines for adults include the following key practices.  . A routine yearly physical is a good way to check with your health care provider about your health and preventive screening. It is a chance to share any concerns and updates on your health and to receive a thorough exam.  . Visit your dentist for a routine exam and preventive care every 6 months. Brush your teeth twice a day and floss once a day. Good oral hygiene prevents tooth decay and gum disease.  . The frequency of eye exams is based on your age, health, family medical history, use  of contact lenses, and other factors. Follow your health care provider's ecommendations for frequency of eye exams.  . Eat a healthy diet. Foods like vegetables, fruits, whole grains, low-fat dairy products, and lean protein foods contain the nutrients you need without too many calories.  Decrease your intake of foods high in solid fats, added sugars, and salt. Eat the right amount of calories for you. Get information about a proper diet from your health care provider, if necessary.  . Regular physical exercise is one of the most important things you can do for your health. Most adults should get at least 150 minutes of moderate-intensity exercise (any activity that increases your heart rate and causes you to sweat) each week. In addition, most adults need muscle-strengthening exercises on 2 or more days a week.  Silver Sneakers may be a benefit available to you. To determine eligibility, you may visit the website: www.silversneakers.com or contact program at [phone] Mon-Fri between 8AM-8PM.   . Maintain a healthy weight. The body mass index (BMI) is a screening tool to identify possible weight problems. It provides an estimate of body fat based on height and weight. Your health care provider can find your BMI and can help you achieve or maintain a healthy weight.   For adults 20 years and older: ? A BMI below 18.5 is considered underweight. ? A BMI of 18.5 to 24.9 is normal. ? A BMI of 25 to 29.9 is considered overweight. ? A BMI of 30 and above is considered obese.   . Maintain normal blood lipids and cholesterol levels by exercising and minimizing your intake of saturated fat. Eat a  balanced diet with plenty of fruit and vegetables. Blood tests for lipids and cholesterol should begin at age 27 and be repeated every 5 years. If your lipid or cholesterol levels are high, you are over 50, or you are at high risk for heart disease, you may need your cholesterol levels checked more frequently. Ongoing high lipid and cholesterol levels should be treated with medicines if diet and exercise are not working.  . If you smoke, find out from your health care provider how to quit. If you do not use tobacco, please do not start.  . If you choose to drink alcohol, please do not consume  more than 2 drinks per day. One drink is considered to be 12 ounces (355 mL) of beer, 5 ounces (148 mL) of wine, or 1.5 ounces (44 mL) of liquor.  . If you are 52-59 years old, ask your health care provider if you should take aspirin to prevent strokes.  . Use sunscreen. Apply sunscreen liberally and repeatedly throughout the day. You should seek shade when your shadow is shorter than you. Protect yourself by wearing long sleeves, pants, a wide-brimmed hat, and sunglasses year round, whenever you are outdoors.  . Once a month, do a whole body skin exam, using a mirror to look at the skin on your back. Tell your health care provider of new moles, moles that have irregular borders, moles that are larger than a pencil eraser, or moles that have changed in shape or color.

## 2016-05-12 NOTE — Progress Notes (Signed)
I reviewed health advisor's note, was available for consultation, and agree with documentation and plan.  

## 2016-05-12 NOTE — Progress Notes (Signed)
PCP notes:   Health maintenance:  Hep C screening - addressed; pt agreed to complete with future labs (order in lab section)  Abnormal screenings:   Hearing - failed  Patient concerns:   Pt reports concerns with lower back and left hip pain. Pain scale: 5/10.  Nurse concerns:  None  Next PCP appt:   05/17/16 @ 0930

## 2016-05-12 NOTE — Progress Notes (Signed)
Pre visit review using our clinic review tool, if applicable. No additional management support is needed unless otherwise documented below in the visit note. 

## 2016-05-12 NOTE — Progress Notes (Signed)
Subjective:   Benjamin Cortez is a 71 y.o. male who presents for Medicare Annual/Subsequent preventive examination.  Review of Systems:  N/A Cardiac Risk Factors include: advanced age (>65men, >35 women);male gender;obesity (BMI >30kg/m2)     Objective:    Vitals: BP 118/78 (BP Location: Right Arm, Patient Position: Sitting, Cuff Size: Normal)   Pulse 65   Temp 98.2 F (36.8 C) (Oral)   Ht 5\' 7"  (1.702 m) Comment: no shoes  Wt 223 lb (101.2 kg)   SpO2 94%   BMI 34.93 kg/m   Body mass index is 34.93 kg/m.  Tobacco History  Smoking Status  . Former Smoker  . Packs/day: 1.00  . Years: 16.00  . Quit date: 02/07/1977  Smokeless Tobacco  . Never Used     Counseling given: No   Past Medical History:  Diagnosis Date  . Allergic rhinitis   . Anxiety    work related  . Asthmatic bronchitis    controlled with meds  . Attention deficit disorder    Meds in past--stopped with retirement  . GERD (gastroesophageal reflux disease)   . History of colonic polyps    [name]  . Ocular migraine   . Renal calculus    Past Surgical History:  Procedure Laterality Date  . APPENDECTOMY  1959  . CHOLECYSTECTOMY  2005  . KIDNEY STONE SURGERY     Basket extraction  . TONSILLECTOMY  1959   Family History  Problem Relation Age of Onset  . Colon cancer Neg Hx   . Stomach cancer Neg Hx   . Esophageal cancer Neg Hx   . Rectal cancer Neg Hx    History  Sexual Activity  . Sexual activity: Yes    Outpatient Encounter Prescriptions as of 05/12/2016  Medication Sig  . aspirin 325 MG tablet Take 325 mg by mouth daily.    . cetirizine (ZYRTEC) 10 MG tablet Take 10 mg by mouth daily.  Marland Kitchen guaiFENesin (MUCINEX) 600 MG 12 hr tablet Take 1,200 mg by mouth 2 (two) times daily as needed.   . loratadine (CLARITIN) 10 MG tablet Take 10 mg by mouth daily.  . Multiple Vitamin (MULTIVITAMIN) tablet Take 1 tablet by mouth daily.  Marland Kitchen omeprazole (PRILOSEC) 20 MG capsule Take 20 mg by mouth daily.     No  facility-administered encounter medications on file as of 05/12/2016.     Activities of Daily Living In your present state of health, do you have any difficulty performing the following activities: 05/12/2016  Hearing? Y  Vision? N  Difficulty concentrating or making decisions? N  Walking or climbing stairs? Y  Dressing or bathing? N  Doing errands, shopping? N  Preparing Food and eating ? N  Using the Toilet? N  In the past six months, have you accidently leaked urine? N  Do you have problems with loss of bowel control? N  Managing your Medications? N  Managing your Finances? N  Housekeeping or managing your Housekeeping? N  Some recent data might be hidden    Patient Care Team: Venia Carbon, MD as PCP - General (Internal Medicine) Calvert Cantor, MD as Consulting Physician (Ophthalmology)   Assessment:     Hearing Screening   125Hz  250Hz  500Hz  1000Hz  2000Hz  3000Hz  4000Hz  6000Hz  8000Hz   Right ear:   40 40 40  40    Left ear:   40 40 40  0    Vision Screening Comments: Last vision exam in Dec 2017 with Dr. Bing Plume  Exercise Activities and Dietary recommendations Current Exercise Habits: The patient does not participate in regular exercise at present, Exercise limited by: None identified  Goals    . Weight (lb) < 200 lb (90.7 kg)          When scheduled, I am going to consult with orthopedic MD about hip and back problems in an effort to increase physical activity and to lose weight. My target weight is 190lbs.       Fall Risk Fall Risk  05/12/2016 05/15/2015 05/13/2014 04/25/2013  Falls in the past year? No No No No   Depression Screen PHQ 2/9 Scores 05/12/2016 05/15/2015 05/13/2014 04/25/2013  PHQ - 2 Score 0 0 0 0    Cognitive Function MMSE - Mini Mental State Exam 05/12/2016  Orientation to time 5  Orientation to Place 5  Registration 3  Attention/ Calculation 0  Recall 3  Language- name 2 objects 0  Language- repeat 1  Language- follow 3 step command 3  Language- read &  follow direction 0  Write a sentence 0  Copy design 0  Total score 20     PLEASE NOTE: A Mini-Cog screen was completed. Maximum score is 20. A value of 0 denotes this part of Folstein MMSE was not completed or the patient failed this part of the Mini-Cog screening.   Mini-Cog Screening Orientation to Time - Max 5 pts Orientation to Place - Max 5 pts Registration - Max 3 pts Recall - Max 3 pts Language Repeat - Max 1 pts Language Follow 3 Step Command - Max 3 pts     Immunization History  Administered Date(s) Administered  . Influenza Split 01/28/2011, 11/14/2011, 12/27/2012  . Influenza Whole 11/21/2007, 01/13/2010  . Influenza, Seasonal, Injecte, Preservative Fre 11/08/2014  . Influenza-Unspecified 12/08/2013, 11/08/2015  . Pneumococcal Conjugate-13 04/25/2013  . Pneumococcal Polysaccharide-23 05/13/2014  . Tdap 02/08/2007  . Zoster 08/23/2012   Screening Tests Health Maintenance  Topic Date Due  . Hepatitis C Screening  06/06/2016 (Originally 06/21/1945)  . INFLUENZA VACCINE  09/07/2016  . COLONOSCOPY  11/22/2016  . TETANUS/TDAP  02/07/2017  . PNA vac Low Risk Adult  Completed      Plan:     I have personally reviewed and addressed the Medicare Annual Wellness questionnaire and have noted the following in the patient's chart:  A. Medical and social history B. Use of alcohol, tobacco or illicit drugs  C. Current medications and supplements D. Functional ability and status E.  Nutritional status F.  Physical activity G. Advance directives H. List of other physicians I.  Hospitalizations, surgeries, and ER visits in previous 12 months J.  [place] to include hearing, vision, cognitive, depression L. Referrals and appointments - none  In addition, I have reviewed and discussed with patient certain preventive protocols, quality metrics, and best practice recommendations. A written personalized care plan for preventive services as well as general  preventive health recommendations were provided to patient.  See attached scanned questionnaire for additional information.   Signed,   Lindell Noe, MHA, BS, LPN Health Coach'

## 2016-05-17 ENCOUNTER — Ambulatory Visit (INDEPENDENT_AMBULATORY_CARE_PROVIDER_SITE_OTHER): Payer: PPO | Admitting: Internal Medicine

## 2016-05-17 ENCOUNTER — Encounter: Payer: Self-pay | Admitting: Internal Medicine

## 2016-05-17 VITALS — BP 134/82 | HR 66 | Temp 98.2°F | Wt 225.0 lb

## 2016-05-17 DIAGNOSIS — K219 Gastro-esophageal reflux disease without esophagitis: Secondary | ICD-10-CM

## 2016-05-17 DIAGNOSIS — M25552 Pain in left hip: Secondary | ICD-10-CM | POA: Insufficient documentation

## 2016-05-17 DIAGNOSIS — R310 Gross hematuria: Secondary | ICD-10-CM | POA: Insufficient documentation

## 2016-05-17 DIAGNOSIS — J4520 Mild intermittent asthma, uncomplicated: Secondary | ICD-10-CM

## 2016-05-17 LAB — COMPREHENSIVE METABOLIC PANEL
ALT: 26 U/L (ref 0–53)
AST: 19 U/L (ref 0–37)
Albumin: 4.2 g/dL (ref 3.5–5.2)
Alkaline Phosphatase: 97 U/L (ref 39–117)
BILIRUBIN TOTAL: 0.8 mg/dL (ref 0.2–1.2)
BUN: 16 mg/dL (ref 6–23)
CHLORIDE: 105 meq/L (ref 96–112)
CO2: 29 mEq/L (ref 19–32)
CREATININE: 1.1 mg/dL (ref 0.40–1.50)
Calcium: 9.5 mg/dL (ref 8.4–10.5)
GFR: 70.69 mL/min (ref >60.00)
Glucose, Bld: 106 mg/dL — ABNORMAL HIGH (ref 70–99)
Potassium: 4.9 mEq/L (ref 3.5–5.1)
SODIUM: 140 meq/L (ref 135–145)
Total Protein: 6.7 g/dL (ref 6.0–8.3)

## 2016-05-17 LAB — CBC WITH DIFFERENTIAL/PLATELET
BASOS PCT: 0.6 % (ref 0.0–3.0)
Basophils Absolute: 0 10*3/uL (ref 0.0–0.1)
Eosinophils Absolute: 0.3 10*3/uL (ref 0.0–0.7)
Eosinophils Relative: 4 % (ref 0.0–5.0)
HCT: 46.4 % (ref 39.0–52.0)
Hemoglobin: 16 g/dL (ref 13.0–17.0)
LYMPHS ABS: 1.3 10*3/uL (ref 0.7–4.0)
Lymphocytes Relative: 20 % (ref 12.0–46.0)
MCHC: 34.4 g/dL (ref 30.0–36.0)
MCV: 92.7 fl (ref 78.0–100.0)
MONO ABS: 0.7 10*3/uL (ref 0.1–1.0)
Monocytes Relative: 10.1 % (ref 3.0–12.0)
NEUTROS PCT: 65.3 % (ref 43.0–77.0)
Neutro Abs: 4.4 10*3/uL (ref 1.4–7.7)
Platelets: 247 10*3/uL (ref 150.0–400.0)
RBC: 5.01 Mil/uL (ref 4.22–5.81)
RDW: 13.2 % (ref 11.5–15.5)
WBC: 6.7 10*3/uL (ref 4.0–10.5)

## 2016-05-17 LAB — PSA: PSA: 1.6 ng/mL (ref 0.10–4.00)

## 2016-05-17 NOTE — Assessment & Plan Note (Signed)
Might be from stone but needs work up  Was past smoker Will refer to urology

## 2016-05-17 NOTE — Progress Notes (Signed)
Subjective:    Patient ID: Benjamin Cortez, male    DOB: 1945-06-07, 71 y.o.   MRN: [id number]  HPI Here for follow up of chronic health conditions Reviewed [facility] visit from last week  Having some problems with his hip Bursitis or "something else" Is planning to make appt at [place] where he has been before Has not been doing his regular exercises Now pain more posteriorly Not really doing any exercise  Ongoing allergy problems Spring pollen especially bad Asthma has been quiet--- no wheezing or SOB No regular cough Has rescue inhaler--but hasn't needed  Had some blood in his urine the other day Wonders about stone--this is similar to past stone problems Some back heaviness but no distinct findings  Stream is okay Nocturia is variable  Reflux remains quiet on Rx No dysphagia  Current Outpatient Prescriptions on File Prior to Visit  Medication Sig Dispense Refill  . aspirin 325 MG tablet Take 325 mg by mouth daily.      . cetirizine (ZYRTEC) 10 MG tablet Take 10 mg by mouth daily.    Marland Kitchen guaiFENesin (MUCINEX) 600 MG 12 hr tablet Take 1,200 mg by mouth 2 (two) times daily as needed.     . loratadine (CLARITIN) 10 MG tablet Take 10 mg by mouth daily.    . Multiple Vitamin (MULTIVITAMIN) tablet Take 1 tablet by mouth daily.    Marland Kitchen omeprazole (PRILOSEC) 20 MG capsule Take 20 mg by mouth daily.       No current facility-administered medications on file prior to visit.     Allergies  Allergen Reactions  . Codeine     REACTION: nausea  . Penicillins     As a child--rash  . Tetracycline     Rash, blisters    Past Medical History:  Diagnosis Date  . Allergic rhinitis   . Anxiety    work related  . Asthmatic bronchitis    controlled with meds  . Attention deficit disorder    Meds in past--stopped with retirement  . GERD (gastroesophageal reflux disease)   . History of colonic polyps    [name]  . Ocular migraine   . Renal calculus     Past Surgical History:    Procedure Laterality Date  . APPENDECTOMY  1959  . CHOLECYSTECTOMY  2005  . KIDNEY STONE SURGERY     Basket extraction  . TONSILLECTOMY  1959    Family History  Problem Relation Age of Onset  . Colon cancer Neg Hx   . Stomach cancer Neg Hx   . Esophageal cancer Neg Hx   . Rectal cancer Neg Hx     Social History   Social History  . Marital status: Married    Spouse name: N/A  . Number of children: 2  . Years of education: N/A   Occupational History  . Scientist, clinical (histocompatibility and immunogenetics)     retired 2014   Social History Main Topics  . Smoking status: Former Smoker    Packs/day: 1.00    Years: 16.00    Quit date: 02/07/1977  . Smokeless tobacco: Never Used  . Alcohol use Yes     Comment: rare use  . Drug use: No  . Sexual activity: Yes   Other Topics Concern  . Not on file   Social History Narrative   No living will   Would want wife to make health care decisions   Would accept resuscitation attempts   Not sure about tube feeds  Review of Systems Sleeps okay Appetite is fine Bowels are fine. Due for colon later this year.    Objective:   Physical Exam  Constitutional: He appears well-developed and well-nourished. No distress.  HENT:  Mouth/Throat: Oropharynx is clear and moist. No oropharyngeal exudate.  Neck: No thyromegaly present.  Cardiovascular: Normal rate, regular rhythm, normal heart sounds and intact distal pulses.  Exam reveals no gallop.   No murmur heard. Pulmonary/Chest: Effort normal and breath sounds normal. No respiratory distress. He has no wheezes. He has no rales.  Abdominal: Soft. There is no tenderness.  Musculoskeletal: He exhibits no edema or tenderness.  Lymphadenopathy:    He has no cervical adenopathy.  Skin: No rash noted. No erythema.  Psychiatric: He has a normal mood and affect. His behavior is normal.          Assessment & Plan:

## 2016-05-17 NOTE — Assessment & Plan Note (Signed)
Severe in past but quiet on PPI

## 2016-05-17 NOTE — Assessment & Plan Note (Signed)
Pain is more posterior He is going to see ortho  Findings don't suggest end stage osteoarthritis

## 2016-05-17 NOTE — Assessment & Plan Note (Signed)
Has been quiet Mostly allergy related

## 2016-05-17 NOTE — Progress Notes (Signed)
Pre visit review using our clinic review tool, if applicable. No additional management support is needed unless otherwise documented below in the visit note. 

## 2016-05-18 DIAGNOSIS — M7062 Trochanteric bursitis, left hip: Secondary | ICD-10-CM | POA: Diagnosis not present

## 2016-05-18 DIAGNOSIS — M48061 Spinal stenosis, lumbar region without neurogenic claudication: Secondary | ICD-10-CM | POA: Diagnosis not present

## 2016-05-20 DIAGNOSIS — M545 Low back pain: Secondary | ICD-10-CM | POA: Diagnosis not present

## 2016-05-20 DIAGNOSIS — G8929 Other chronic pain: Secondary | ICD-10-CM | POA: Diagnosis not present

## 2016-05-25 DIAGNOSIS — G8929 Other chronic pain: Secondary | ICD-10-CM | POA: Diagnosis not present

## 2016-05-25 DIAGNOSIS — M545 Low back pain: Secondary | ICD-10-CM | POA: Diagnosis not present

## 2016-05-27 DIAGNOSIS — M545 Low back pain: Secondary | ICD-10-CM | POA: Diagnosis not present

## 2016-05-27 DIAGNOSIS — G8929 Other chronic pain: Secondary | ICD-10-CM | POA: Diagnosis not present

## 2016-05-31 ENCOUNTER — Encounter: Payer: Self-pay | Admitting: Internal Medicine

## 2016-06-01 DIAGNOSIS — M545 Low back pain: Secondary | ICD-10-CM | POA: Diagnosis not present

## 2016-06-01 DIAGNOSIS — G8929 Other chronic pain: Secondary | ICD-10-CM | POA: Diagnosis not present

## 2016-06-03 DIAGNOSIS — G8929 Other chronic pain: Secondary | ICD-10-CM | POA: Diagnosis not present

## 2016-06-03 DIAGNOSIS — M545 Low back pain: Secondary | ICD-10-CM | POA: Diagnosis not present

## 2016-06-08 DIAGNOSIS — M545 Low back pain: Secondary | ICD-10-CM | POA: Diagnosis not present

## 2016-06-08 DIAGNOSIS — G8929 Other chronic pain: Secondary | ICD-10-CM | POA: Diagnosis not present

## 2016-06-10 DIAGNOSIS — M545 Low back pain: Secondary | ICD-10-CM | POA: Diagnosis not present

## 2016-06-10 DIAGNOSIS — G8929 Other chronic pain: Secondary | ICD-10-CM | POA: Diagnosis not present

## 2016-06-14 DIAGNOSIS — G8929 Other chronic pain: Secondary | ICD-10-CM | POA: Diagnosis not present

## 2016-06-14 DIAGNOSIS — M545 Low back pain: Secondary | ICD-10-CM | POA: Diagnosis not present

## 2016-06-15 DIAGNOSIS — M7062 Trochanteric bursitis, left hip: Secondary | ICD-10-CM | POA: Diagnosis not present

## 2016-06-15 DIAGNOSIS — M48061 Spinal stenosis, lumbar region without neurogenic claudication: Secondary | ICD-10-CM | POA: Diagnosis not present

## 2016-06-17 DIAGNOSIS — G8929 Other chronic pain: Secondary | ICD-10-CM | POA: Diagnosis not present

## 2016-06-17 DIAGNOSIS — M545 Low back pain: Secondary | ICD-10-CM | POA: Diagnosis not present

## 2016-06-23 DIAGNOSIS — G8929 Other chronic pain: Secondary | ICD-10-CM | POA: Diagnosis not present

## 2016-06-23 DIAGNOSIS — M545 Low back pain: Secondary | ICD-10-CM | POA: Diagnosis not present

## 2016-06-30 DIAGNOSIS — M545 Low back pain: Secondary | ICD-10-CM | POA: Diagnosis not present

## 2016-06-30 DIAGNOSIS — G8929 Other chronic pain: Secondary | ICD-10-CM | POA: Diagnosis not present

## 2016-07-05 DIAGNOSIS — R310 Gross hematuria: Secondary | ICD-10-CM | POA: Diagnosis not present

## 2016-07-05 DIAGNOSIS — N202 Calculus of kidney with calculus of ureter: Secondary | ICD-10-CM | POA: Diagnosis not present

## 2016-07-05 DIAGNOSIS — Z125 Encounter for screening for malignant neoplasm of prostate: Secondary | ICD-10-CM | POA: Diagnosis not present

## 2016-07-08 DIAGNOSIS — R310 Gross hematuria: Secondary | ICD-10-CM | POA: Diagnosis not present

## 2016-07-14 DIAGNOSIS — M722 Plantar fascial fibromatosis: Secondary | ICD-10-CM | POA: Diagnosis not present

## 2016-07-14 DIAGNOSIS — M7062 Trochanteric bursitis, left hip: Secondary | ICD-10-CM | POA: Diagnosis not present

## 2016-08-22 ENCOUNTER — Other Ambulatory Visit: Payer: Self-pay | Admitting: Urology

## 2016-08-22 DIAGNOSIS — C670 Malignant neoplasm of trigone of bladder: Secondary | ICD-10-CM | POA: Diagnosis not present

## 2016-08-22 DIAGNOSIS — R310 Gross hematuria: Secondary | ICD-10-CM | POA: Diagnosis not present

## 2016-08-25 ENCOUNTER — Encounter (HOSPITAL_BASED_OUTPATIENT_CLINIC_OR_DEPARTMENT_OTHER): Payer: Self-pay | Admitting: *Deleted

## 2016-08-25 NOTE — Progress Notes (Signed)
To [facility] at [phone] on arrival-Npo solids after Mn-clear liquids(no pulp,dairy) until 0900,then Npo-will take prilosec in am.

## 2016-09-07 ENCOUNTER — Other Ambulatory Visit (HOSPITAL_COMMUNITY): Payer: Self-pay | Admitting: Radiology

## 2016-09-07 ENCOUNTER — Ambulatory Visit (HOSPITAL_BASED_OUTPATIENT_CLINIC_OR_DEPARTMENT_OTHER)
Admission: RE | Admit: 2016-09-07 | Discharge: 2016-09-07 | Disposition: A | Discharge status: Home / Self Care | Payer: PPO | Source: Ambulatory Visit | Attending: Urology | Admitting: Urology

## 2016-09-07 ENCOUNTER — Ambulatory Visit (HOSPITAL_BASED_OUTPATIENT_CLINIC_OR_DEPARTMENT_OTHER): Payer: PPO | Admitting: Anesthesiology

## 2016-09-07 ENCOUNTER — Encounter (HOSPITAL_BASED_OUTPATIENT_CLINIC_OR_DEPARTMENT_OTHER)
Admission: RE | Disposition: A | Discharge status: Home / Self Care | Payer: Self-pay | Source: Ambulatory Visit | Attending: Urology

## 2016-09-07 ENCOUNTER — Ambulatory Visit: Payer: Self-pay

## 2016-09-07 ENCOUNTER — Encounter (HOSPITAL_BASED_OUTPATIENT_CLINIC_OR_DEPARTMENT_OTHER): Payer: Self-pay | Admitting: Anesthesiology

## 2016-09-07 DIAGNOSIS — R310 Gross hematuria: Secondary | ICD-10-CM | POA: Diagnosis not present

## 2016-09-07 DIAGNOSIS — N400 Enlarged prostate without lower urinary tract symptoms: Secondary | ICD-10-CM | POA: Diagnosis not present

## 2016-09-07 DIAGNOSIS — Z880 Allergy status to penicillin: Secondary | ICD-10-CM | POA: Insufficient documentation

## 2016-09-07 DIAGNOSIS — C679 Malignant neoplasm of bladder, unspecified: Secondary | ICD-10-CM | POA: Diagnosis not present

## 2016-09-07 DIAGNOSIS — D090 Carcinoma in situ of bladder: Secondary | ICD-10-CM | POA: Insufficient documentation

## 2016-09-07 DIAGNOSIS — I10 Essential (primary) hypertension: Secondary | ICD-10-CM | POA: Diagnosis not present

## 2016-09-07 DIAGNOSIS — K219 Gastro-esophageal reflux disease without esophagitis: Secondary | ICD-10-CM | POA: Diagnosis not present

## 2016-09-07 DIAGNOSIS — Z87891 Personal history of nicotine dependence: Secondary | ICD-10-CM | POA: Insufficient documentation

## 2016-09-07 DIAGNOSIS — Z419 Encounter for procedure for purposes other than remedying health state, unspecified: Secondary | ICD-10-CM

## 2016-09-07 DIAGNOSIS — K635 Polyp of colon: Secondary | ICD-10-CM | POA: Diagnosis not present

## 2016-09-07 DIAGNOSIS — D494 Neoplasm of unspecified behavior of bladder: Secondary | ICD-10-CM | POA: Diagnosis not present

## 2016-09-07 DIAGNOSIS — Z881 Allergy status to other antibiotic agents status: Secondary | ICD-10-CM | POA: Insufficient documentation

## 2016-09-07 HISTORY — DX: Malignant (primary) neoplasm, unspecified: C80.1

## 2016-09-07 HISTORY — PX: CYSTOSCOPY WITH RETROGRADE PYELOGRAM, URETEROSCOPY AND STENT PLACEMENT: SHX5789

## 2016-09-07 HISTORY — DX: Plantar fascial fibromatosis: M72.2

## 2016-09-07 HISTORY — DX: Personal history of urinary calculi: Z87.442

## 2016-09-07 HISTORY — PX: TRANSURETHRAL RESECTION OF BLADDER TUMOR: SHX2575

## 2016-09-07 LAB — POCT I-STAT, CHEM 8
BUN: 17 mg/dL (ref 6–20)
Calcium, Ion: 1.23 mmol/L (ref 1.15–1.40)
Chloride: 107 mmol/L (ref 101–111)
Creatinine, Ser: 0.9 mg/dL (ref 0.61–1.24)
Glucose, Bld: 96 mg/dL (ref 65–99)
HEMATOCRIT: 44 % (ref 39.0–52.0)
HEMOGLOBIN: 15 g/dL (ref 13.0–17.0)
POTASSIUM: 4.1 mmol/L (ref 3.5–5.1)
SODIUM: 143 mmol/L (ref 135–145)
TCO2: 25 mmol/L (ref 0–100)

## 2016-09-07 SURGERY — TURBT (TRANSURETHRAL RESECTION OF BLADDER TUMOR)
Anesthesia: General | Laterality: Right

## 2016-09-07 MED ORDER — SENNOSIDES-DOCUSATE SODIUM 8.6-50 MG PO TABS
1.0000 | ORAL_TABLET | Freq: Two times a day (BID) | ORAL | 0 refills | Status: DC
Start: 2016-09-07 — End: 2016-10-19

## 2016-09-07 MED ORDER — KETOROLAC TROMETHAMINE 30 MG/ML IJ SOLN
30.0000 mg | Freq: Once | INTRAMUSCULAR | Status: DC | PRN
  Filled 2016-09-07: qty 1

## 2016-09-07 MED ORDER — DEXTROSE 5 % IV SOLN
5.0000 mg/kg | INTRAVENOUS | Status: DC
  Filled 2016-09-07: qty 12.75

## 2016-09-07 MED ORDER — PROPOFOL 10 MG/ML IV BOLUS
INTRAVENOUS | Status: DC | PRN
  Administered 2016-09-07: 200 mg via INTRAVENOUS
  Administered 2016-09-07: 30 mg via INTRAVENOUS

## 2016-09-07 MED ORDER — LACTATED RINGERS IV SOLN
INTRAVENOUS | Status: DC
  Administered 2016-09-07 (×2): via INTRAVENOUS
  Filled 2016-09-07: qty 1000

## 2016-09-07 MED ORDER — FENTANYL CITRATE (PF) 100 MCG/2ML IJ SOLN
25.0000 ug | INTRAMUSCULAR | Status: DC | PRN
  Filled 2016-09-07: qty 1

## 2016-09-07 MED ORDER — MIDAZOLAM HCL 5 MG/5ML IJ SOLN
INTRAMUSCULAR | Status: DC | PRN
  Administered 2016-09-07: 2 mg via INTRAVENOUS

## 2016-09-07 MED ORDER — DEXAMETHASONE SODIUM PHOSPHATE 10 MG/ML IJ SOLN
INTRAMUSCULAR | Status: AC
  Filled 2016-09-07: qty 1

## 2016-09-07 MED ORDER — KETOROLAC TROMETHAMINE 30 MG/ML IJ SOLN
INTRAMUSCULAR | Status: AC
  Filled 2016-09-07: qty 1

## 2016-09-07 MED ORDER — DEXAMETHASONE SODIUM PHOSPHATE 4 MG/ML IJ SOLN
INTRAMUSCULAR | Status: DC | PRN
  Administered 2016-09-07: 10 mg via INTRAVENOUS

## 2016-09-07 MED ORDER — ONDANSETRON HCL 4 MG/2ML IJ SOLN
INTRAMUSCULAR | Status: DC | PRN
  Administered 2016-09-07: 4 mg via INTRAVENOUS

## 2016-09-07 MED ORDER — ONDANSETRON HCL 4 MG/2ML IJ SOLN
INTRAMUSCULAR | Status: AC
  Filled 2016-09-07: qty 2

## 2016-09-07 MED ORDER — FENTANYL CITRATE (PF) 100 MCG/2ML IJ SOLN
INTRAMUSCULAR | Status: AC
  Filled 2016-09-07: qty 2

## 2016-09-07 MED ORDER — FENTANYL CITRATE (PF) 100 MCG/2ML IJ SOLN
INTRAMUSCULAR | Status: DC | PRN
  Administered 2016-09-07 (×4): 25 ug via INTRAVENOUS

## 2016-09-07 MED ORDER — PROMETHAZINE HCL 25 MG/ML IJ SOLN
6.2500 mg | INTRAMUSCULAR | Status: DC | PRN
  Filled 2016-09-07: qty 1

## 2016-09-07 MED ORDER — GENTAMICIN SULFATE 40 MG/ML IJ SOLN
5.0000 mg/kg | INTRAVENOUS | Status: AC
  Administered 2016-09-07: 390 mg via INTRAVENOUS
  Filled 2016-09-07: qty 9.75

## 2016-09-07 MED ORDER — PROPOFOL 10 MG/ML IV BOLUS
INTRAVENOUS | Status: AC
  Filled 2016-09-07: qty 40

## 2016-09-07 MED ORDER — MIDAZOLAM HCL 2 MG/2ML IJ SOLN
INTRAMUSCULAR | Status: AC
  Filled 2016-09-07: qty 2

## 2016-09-07 MED ORDER — LIDOCAINE 2% (20 MG/ML) 5 ML SYRINGE
INTRAMUSCULAR | Status: AC
  Filled 2016-09-07: qty 5

## 2016-09-07 MED ORDER — TRAMADOL HCL 50 MG PO TABS: 50.0000 mg | ORAL_TABLET | Freq: Four times a day (QID) | ORAL | 0 refills | Status: DC | PRN

## 2016-09-07 MED ORDER — KETOROLAC TROMETHAMINE 30 MG/ML IJ SOLN
INTRAMUSCULAR | Status: DC | PRN
  Administered 2016-09-07: 30 mg via INTRAVENOUS

## 2016-09-07 MED ORDER — LIDOCAINE HCL (CARDIAC) 20 MG/ML IV SOLN
INTRAVENOUS | Status: DC | PRN
  Administered 2016-09-07: 100 mg via INTRAVENOUS

## 2016-09-07 SURGICAL SUPPLY — 33 items
BAG DRAIN URO-CYSTO SKYTR STRL (DRAIN) ×3 IMPLANT
BAG DRN ANRFLXCHMBR STRAP LEK (BAG)
BAG DRN UROCATH (DRAIN) ×2
BAG URINE DRAINAGE (UROLOGICAL SUPPLIES) IMPLANT
BAG URINE LEG 19OZ MD ST LTX (BAG) IMPLANT
BAG URINE LEG 500ML (DRAIN) IMPLANT
CATH FOLEY 2WAY SLVR  5CC 22FR (CATHETERS)
CATH FOLEY 2WAY SLVR 30CC 20FR (CATHETERS) IMPLANT
CATH FOLEY 2WAY SLVR 5CC 22FR (CATHETERS) IMPLANT
CATH INTERMIT  6FR 70CM (CATHETERS) ×3 IMPLANT
CLOTH BEACON ORANGE TIMEOUT ST (SAFETY) ×3 IMPLANT
ELECT REM PT RETURN 9FT ADLT (ELECTROSURGICAL) ×3
ELECTRODE REM PT RTRN 9FT ADLT (ELECTROSURGICAL) ×2 IMPLANT
EVACUATOR MICROVAS BLADDER (UROLOGICAL SUPPLIES) IMPLANT
GLOVE BIO SURGEON STRL SZ7.5 (GLOVE) ×3 IMPLANT
GOWN STRL REUS W/ TWL LRG LVL3 (GOWN DISPOSABLE) ×2 IMPLANT
GOWN STRL REUS W/TWL LRG LVL3 (GOWN DISPOSABLE) ×3
GOWN STRL REUS W/TWL XL LVL3 (GOWN DISPOSABLE) ×6 IMPLANT
GUIDEWIRE ANG ZIPWIRE 038X150 (WIRE) ×3 IMPLANT
GUIDEWIRE STR DUAL SENSOR (WIRE) ×3 IMPLANT
INFUSOR MANOMETER BAG 3000ML (MISCELLANEOUS) ×2 IMPLANT
IV NS 1000ML (IV SOLUTION) ×3
IV NS 1000ML BAXH (IV SOLUTION) ×2 IMPLANT
IV NS IRRIG 3000ML ARTHROMATIC (IV SOLUTION) ×4 IMPLANT
KIT RM TURNOVER CYSTO AR (KITS) ×3 IMPLANT
LOOP CUT BIPOLAR 24F LRG (ELECTROSURGICAL) ×1 IMPLANT
MANIFOLD NEPTUNE II (INSTRUMENTS) ×1 IMPLANT
NS IRRIG 500ML POUR BTL (IV SOLUTION) ×3 IMPLANT
PACK CYSTO (CUSTOM PROCEDURE TRAY) ×3 IMPLANT
SYRINGE 10CC LL (SYRINGE) ×3 IMPLANT
SYRINGE IRR TOOMEY STRL 70CC (SYRINGE) ×1 IMPLANT
TUBE CONNECTING 12X1/4 (SUCTIONS) ×1 IMPLANT
TUBE FEEDING 8FR 16IN STR KANG (MISCELLANEOUS) ×2 IMPLANT

## 2016-09-07 NOTE — Anesthesia Preprocedure Evaluation (Signed)
Anesthesia Evaluation  Patient identified by MRN, date of birth, ID band Patient awake    Reviewed: Allergy & Precautions, NPO status , Patient's Chart, lab work & pertinent test results  Airway Mallampati: II  TM Distance: >3 FB Neck ROM: Full    Dental no notable dental hx.    Pulmonary neg pulmonary ROS, former smoker,    Pulmonary exam normal breath sounds clear to auscultation       Cardiovascular negative cardio ROS Normal cardiovascular exam Rhythm:Regular Rate:Normal     Neuro/Psych negative neurological ROS  negative psych ROS   GI/Hepatic Neg liver ROS, GERD  ,  Endo/Other  negative endocrine ROS  Renal/GU negative Renal ROS  negative genitourinary   Musculoskeletal negative musculoskeletal ROS (+)   Abdominal   Peds negative pediatric ROS (+)  Hematology negative hematology ROS (+)   Anesthesia Other Findings   Reproductive/Obstetrics negative OB ROS                             Anesthesia Physical Anesthesia Plan  ASA: II  Anesthesia Plan: General   Post-op Pain Management:    Induction: Intravenous  PONV Risk Score and Plan: 2 and Ondansetron and Dexamethasone  Airway Management Planned: LMA  Additional Equipment:   Intra-op Plan:   Post-operative Plan: Extubation in OR  Informed Consent: I have reviewed the patients History and Physical, chart, labs and discussed the procedure including the risks, benefits and alternatives for the proposed anesthesia with the patient or authorized representative who has indicated his/her understanding and acceptance.   Dental advisory given  Plan Discussed with: CRNA and Surgeon  Anesthesia Plan Comments:         Anesthesia Quick Evaluation

## 2016-09-07 NOTE — Brief Op Note (Signed)
09/07/2016  3:54 PM  PATIENT:  Benjamin Cortez  71 y.o. male  PRE-OPERATIVE DIAGNOSIS:  BLADDER CANCER, POSSIBLE RIGHT URETERAL INVOLVEMENT  POST-OPERATIVE DIAGNOSIS:  BLADDER CANCER, POSSIBLE RIGHT URETERAL INVOLVEMENT  PROCEDURE:  Procedure(s): TRANSURETHRAL RESECTION OF BLADDER TUMOR (TURBT) (N/A) CYSTOSCOPY WITH RIGHT RETROGRADE PYELOGRAM (Right)  SURGEON:  Surgeon(s) and Role:    * Alexis Frock, MD - Primary  PHYSICIAN ASSISTANT:   ASSISTANTS: none   ANESTHESIA:   general  EBL:  Total I/O In: 400 [I.V.:400] Out: 0   BLOOD ADMINISTERED:none  DRAINS: none   LOCAL MEDICATIONS USED:  NONE  SPECIMEN:  Source of Specimen:  1 - bladder tumor, 2 - base of bladder tumor  DISPOSITION OF SPECIMEN:  PATHOLOGY  COUNTS:  YES  TOURNIQUET:  * No tourniquets in log *  DICTATION: .Other Dictation: Dictation Number [id number]  PLAN OF CARE: Discharge to home after PACU  PATIENT DISPOSITION:  PACU - hemodynamically stable.   Delay start of Pharmacological VTE agent (>24hrs) due to surgical blood loss or risk of bleeding: yes

## 2016-09-07 NOTE — H&P (Signed)
Benjamin Cortez is an 71 y.o. male.    Chief Complaint: Pre-Op Transurethral Resection of Bladder Tumor  HPI:   1 - Gross Hematuria / Bladder Cancer - Few episdoes of gross painless hematuria 2018. Remote 15 PY smoker. He has h/o nephrolithiasis. CT 08/2016 unremarkable (no stones / hydro / upper tract masses). Cysto 08/2016 with approx 2cm papillary tumor near Rt UO.    PMH sig for chole, appy, TNA, HTN. His PCP is Viviana Simpler MD.   Today "Benjamin Cortez" is seen  To proceed with transurethral resection of bladder tumor. NO interval fevers. Most recent UA without infectious parameters.    Past Medical History:  Diagnosis Date  . Allergic rhinitis   . Asthmatic bronchitis 1997   controlled with meds-change in work mold exposure  . Attention deficit disorder    Meds in past--stopped with retirement  . Cancer ([place]) 08/2016   bladder  . GERD (gastroesophageal reflux disease)   . History of colonic polyps    [name]  . History of kidney stones 1970's   stone basket extraction  . Ocular migraine   . Plantar fasciitis 2018    Past Surgical History:  Procedure Laterality Date  . APPENDECTOMY  1959  . CHOLECYSTECTOMY  2005  . COLONOSCOPY    . KIDNEY STONE SURGERY  1970's   Basket extraction  . TONSILLECTOMY  1959    Family History  Problem Relation Age of Onset  . Colon cancer Neg Hx   . Stomach cancer Neg Hx   . Esophageal cancer Neg Hx   . Rectal cancer Neg Hx    Social History:  reports that he quit smoking about 39 years ago. He has a 16.00 pack-year smoking history. He has never used smokeless tobacco. He reports that he drinks alcohol. He reports that he does not use drugs.  Allergies:  Allergies  Allergen Reactions  . Tetracycline     Rash, blisters  . Penicillins     As a child--rash  . Codeine Nausea Only    No prescriptions prior to admission.    No results found for this or any previous visit (from the past 48 hour(s)). No results found.  Review of Systems   Constitutional: Negative.  Negative for fever.  HENT: Negative.   Eyes: Negative.   Respiratory: Negative.   Cardiovascular: Negative.   Gastrointestinal: Negative.   Genitourinary: Positive for hematuria.  Musculoskeletal: Negative.   Skin: Negative.   Neurological: Negative.   Endo/Heme/Allergies: Negative.   Psychiatric/Behavioral: Negative.     Height 5\' 7"  (1.702 m), weight 96.2 kg (212 lb). Physical Exam  Constitutional: He appears well-developed.  HENT:  Head: Normocephalic.  Eyes: Pupils are equal, round, and reactive to light.  Neck: Normal range of motion.  Cardiovascular: Normal rate.   Respiratory: Effort normal.  GI: Soft.  Genitourinary:  Genitourinary Comments: No CVAT.   Musculoskeletal: Normal range of motion.  Neurological: He is alert.  Skin: Skin is warm.  Psychiatric: He has a normal mood and affect.     Assessment/Plan  Proceed as planned with cysto, retrogrades, transurethral resection of bladder tumor. Risks, benefits, alternatives, expected peri-op course discussed previously and reiterated today.    Alexis Frock, MD 09/07/2016, 6:27 AM

## 2016-09-07 NOTE — Discharge Instructions (Signed)
°  Post Anesthesia Home Care Instructions ° °Activity: °Get plenty of rest for the remainder of the day. A responsible individual must stay with you for 24 hours following the procedure.  °For the next 24 hours, DO NOT: °-Drive a car °-Operate machinery °-Drink alcoholic beverages °-Take any medication unless instructed by your physician °-Make any legal decisions or sign important papers. ° °Meals: °Start with liquid foods such as gelatin or soup. Progress to regular foods as tolerated. Avoid greasy, spicy, heavy foods. If nausea and/or vomiting occur, drink only clear liquids until the nausea and/or vomiting subsides. Call your physician if vomiting continues. ° °Special Instructions/Symptoms: °Your throat may feel dry or sore from the anesthesia or the breathing tube placed in your throat during surgery. If this causes discomfort, gargle with warm salt water. The discomfort should disappear within 24 hours. ° °If you had a scopolamine patch placed behind your ear for the management of post- operative nausea and/or vomiting: ° °1. The medication in the patch is effective for 72 hours, after which it should be removed.  Wrap patch in a tissue and discard in the trash. Wash hands thoroughly with soap and water. °2. You may remove the patch earlier than 72 hours if you experience unpleasant side effects which may include dry mouth, dizziness or visual disturbances. °3. Avoid touching the patch. Wash your hands with soap and water after contact with the patch. °  ° °1 - You may have urinary urgency (bladder spasms) and bloody urine on / off x few days. This is normal. ° °2 - Call MD or go to ER for fever >102, severe pain / nausea / vomiting not relieved by medications, or acute change in medical status ° °

## 2016-09-07 NOTE — Transfer of Care (Signed)
Immediate Anesthesia Transfer of Care Note  Patient: Benjamin Cortez  Procedure(s) Performed: Procedure(s) (LRB): TRANSURETHRAL RESECTION OF BLADDER TUMOR (TURBT) (N/A) CYSTOSCOPY WITH RIGHT RETROGRADE PYELOGRAM (Right)  Patient Location: PACU  Anesthesia Type: General  Level of Consciousness: awake, sedated, patient cooperative and responds to stimulation  Airway & Oxygen Therapy: Patient Spontanous Breathing and Patient connected to [place] oxygen  Post-op Assessment: Report given to PACU RN, Post -op Vital signs reviewed and stable and Patient moving all extremities  Post vital signs: Reviewed and stable  Complications: No apparent anesthesia complications

## 2016-09-07 NOTE — Anesthesia Postprocedure Evaluation (Signed)
Anesthesia Post Note  Patient: Benjamin Cortez  Procedure(s) Performed: Procedure(s) (LRB): TRANSURETHRAL RESECTION OF BLADDER TUMOR (TURBT) (N/A) CYSTOSCOPY WITH RIGHT RETROGRADE PYELOGRAM (Right)     Patient location during evaluation: PACU Anesthesia Type: General Level of consciousness: awake and alert Pain management: pain level controlled Vital Signs Assessment: post-procedure vital signs reviewed and stable Respiratory status: spontaneous breathing, nonlabored ventilation, respiratory function stable and patient connected to nasal cannula oxygen Cardiovascular status: blood pressure returned to baseline and stable Postop Assessment: no signs of nausea or vomiting Anesthetic complications: no    Last Vitals:  Vitals:   09/07/16 1630 09/07/16 1645  BP: 127/86   Pulse: 64 61  Resp: 20 12  Temp:      Last Pain:  Vitals:   09/07/16 1348  TempSrc: Oral                 Mahati Vajda S

## 2016-09-07 NOTE — Interval H&P Note (Signed)
History and Physical Interval Note:  09/07/2016 3:10 PM  Benjamin Cortez  has presented today for surgery, with the diagnosis of BLADDER CANCER, POSSIBLE RIGHT URETERAL INVOLVEMENT  The various methods of treatment have been discussed with the patient and family. After consideration of risks, benefits and other options for treatment, the patient has consented to  Procedure(s): TRANSURETHRAL RESECTION OF BLADDER TUMOR (TURBT) (N/A) CYSTOSCOPY WITH RETROGRADE PYELOGRAM, URETEROSCOPY AND POSSIBLE STENT PLACEMENT (Right) as a surgical intervention .  The patient's history has been reviewed, patient examined, no change in status, stable for surgery.  I have reviewed the patient's chart and labs.  Questions were answered to the patient's satisfaction.     Rayni Nemitz

## 2016-09-07 NOTE — Anesthesia Procedure Notes (Signed)
Procedure Name: LMA Insertion Date/Time: 09/07/2016 3:33 PM Performed by: Justice Rocher Pre-anesthesia Checklist: Patient identified, Emergency Drugs available, Suction available and Patient being monitored Patient Re-evaluated:Patient Re-evaluated prior to induction Oxygen Delivery Method: Circle system utilized Preoxygenation: Pre-oxygenation with 100% oxygen Induction Type: IV induction Ventilation: Mask ventilation without difficulty LMA: LMA inserted LMA Size: 4.0 Number of attempts: 1 Airway Equipment and Method: Bite block Placement Confirmation: positive ETCO2 and breath sounds checked- equal and bilateral Tube secured with: Tape Dental Injury: Teeth and Oropharynx as per pre-operative assessment

## 2016-09-08 ENCOUNTER — Encounter (HOSPITAL_BASED_OUTPATIENT_CLINIC_OR_DEPARTMENT_OTHER): Payer: Self-pay | Admitting: Urology

## 2016-09-08 NOTE — Op Note (Signed)
NAME:  Benjamin Cortez, Benjamin Cortez NO.:  MEDICAL RECORD NO.:  [id number]  LOCATION:                                 FACILITY:  PHYSICIAN:  Alexis Frock, MD          DATE OF BIRTH:  DATE OF PROCEDURE: 09/07/2016                               OPERATIVE REPORT   DIAGNOSIS:  Bladder tumor.  PROCEDURES: 1. Transurethral resection of bladder tumour, volume medium. 2. Right retrograde pyelogram and interpretation.  ESTIMATED BLOOD LOSS:  Nil.  COMPLICATIONS:  None.  SPECIMENS: 1. Bladder tumor for permanent pathology. 2. Base of bladder tumor for permanent pathology.  FINDINGS: 1. Papillary tumor on the right lateral bladder, approximately 2 cm     lateral to the right ureteral orifice, 2.5 cm in diameter. 2. Unremarkable right retrograde pyelogram.  INDICATIONS:  Mr. Vensel is a very pleasant, 71 year old gentleman, who was found on workup of gross hematuria.  He has known history to have a papillary bladder tumor close to his right ureteral orifice.  Options were discussed for initial management including transurethral resection as the advised path as this was clinically localized for diagnostic and staging purposes.  He wished to proceed.  Informed consent was obtained and placed in the medical record.  PROCEDURE IN DETAIL:  The patient being Benjamin Cortez verified.  Procedures being transurethral resection of bladder tumour and right retrograde pyelogram were confirmed.  Procedure was carried out.  Time-out was performed.  Intravenous antibiotics were administered.  General anesthesia introduced.  The patient was placed into a low lithotomy position.  Sterile field was created by prepping and draping the patient's penis, perineum, proximal thighs using iodine.  Next, cystourethroscopy was performed using a rigid cystoscope with offset lens.  Inspection of anterior and posterior urethra unremarkable. Inspection of the urinary bladder revealed very mild  trabeculation, bilateral [place] ureteral orifices.  There was an approximately 2.5 cm papillary tumor lateral to the right ureteral orifice.  There were no obvious satellite lesions, surrounding erythema.  In the proximity of the right ureteral orifice, it was felt that repeat right retrograde pyelogram would be prudent and the right ureteral orifice was cannulated with a 6-French end-hole catheter and right retrograde pyelogram was obtained.  Right retrograde pyelogram demonstrated a single right ureter with single-system right kidney.  No filling defects or narrowing or hydronephrosis noted, whatsoever.  Rigid scope was then exchanged for a 26-French resectoscope sheath with visual obturator.  Using resectoscope loop, very careful resection was performed of the bladder tumor down what appeared to be superficial fibromuscular stroma at the bladder, taking exquisite care to avoid perforation which did not occur grossly. Bladder tumor fragments were irrigated and set aside for permanent pathology, labeled as bladder tumor.  Next, the small cold cup biopsy forceps was used to obtain deeper sections of what appeared to be the muscularis of the bladder, again taking exquisite care to avoid perforation which did not occur, these were set aside and labeled base of bladder tumor.  Additional fulguration current was applied to the base, which resulted in excellent hemostasis.  There was no evidence of residual tumor.  There was complete hemostasis.  There was continued efflux of urine grossly from the right ureteral orifice.  It was not felt that interval stenting or catheterization would be warranted.  As such, the bladder was partially emptied per cystoscope and procedure was then terminated.  The patient tolerated the procedure well.  There were no immediate periprocedural complications and the patient was taken to the postanesthesia care unit in stable condition.           ______________________________ Alexis Frock, MD     TM/MEDQ  D:  09/07/2016  T:  09/07/2016  Job:  [id number]

## 2016-09-09 ENCOUNTER — Ambulatory Visit
Admission: RE | Admit: 2016-09-09 | Discharge: 2016-09-09 | Disposition: A | Discharge status: Home / Self Care | Payer: Self-pay | Source: Ambulatory Visit | Attending: Urology | Admitting: Urology

## 2016-09-09 ENCOUNTER — Other Ambulatory Visit: Payer: Self-pay | Admitting: Urology

## 2016-09-09 DIAGNOSIS — Z419 Encounter for procedure for purposes other than remedying health state, unspecified: Secondary | ICD-10-CM

## 2016-09-22 DIAGNOSIS — C670 Malignant neoplasm of trigone of bladder: Secondary | ICD-10-CM | POA: Diagnosis not present

## 2016-09-29 ENCOUNTER — Telehealth: Payer: Self-pay | Admitting: Internal Medicine

## 2016-09-29 NOTE — Telephone Encounter (Signed)
Okay Will see at the [facility]

## 2016-09-29 NOTE — Telephone Encounter (Signed)
Pt has appt with Dr Silvio Pate on 09/30/16.

## 2016-09-29 NOTE — Telephone Encounter (Signed)
[facility]  Patient Name: KAHIAU SCHEWE  DOB: 10-May-1945    Initial Comment Caller states that his blood pressure has recently gone up considerably. Most recent reading is 198/98.    Nurse Assessment  Nurse: Joline Salt, RN, Malachy Mood Date/Time Eilene Ghazi Time): 09/29/2016 2:25:09 PM  Confirm and document reason for call. If symptomatic, describe symptoms. ---Caller states that his blood pressure has recently gone up considerably. Most recent reading is 198/98 about 1pm. Last week went to urologist and it was a little high. Yesterday was feeling badly and over 200/98.  Does the patient have any new or worsening symptoms? ---Yes  Will a triage be completed? ---Yes  Related visit to physician within the last 2 weeks? ---Yes  Does the PT have any chronic conditions? (i.e. diabetes, asthma, etc.) ---No  Is this a behavioral health or substance abuse call? ---No     Guidelines    Guideline Title Affirmed Question Affirmed Notes  High Blood Pressure Systolic BP >= 594 OR Diastolic >= 585    Final Disposition User   See Physician within [place], RN, Malachy Mood    Comments  On 09/07/16 had a growth removed from his bladder. Saw his urologist on 8/13 for follow up and was told at that time that BP was a little high. Not on BP meds.  Appt with Dr Lubertha Sayres 09/30/16 At 12:30   Referrals  REFERRED TO PCP OFFICE   Disagree/Comply: Comply

## 2016-09-30 ENCOUNTER — Encounter: Payer: Self-pay | Admitting: Internal Medicine

## 2016-09-30 ENCOUNTER — Ambulatory Visit (INDEPENDENT_AMBULATORY_CARE_PROVIDER_SITE_OTHER): Payer: PPO | Admitting: Internal Medicine

## 2016-09-30 DIAGNOSIS — I10 Essential (primary) hypertension: Secondary | ICD-10-CM | POA: Insufficient documentation

## 2016-09-30 MED ORDER — LOSARTAN POTASSIUM-HCTZ 50-12.5 MG PO TABS: 1.0000 | ORAL_TABLET | Freq: Every day | ORAL | 3 refills | Status: DC

## 2016-09-30 NOTE — Assessment & Plan Note (Signed)
BP Readings from Last 3 Encounters:  09/30/16 (!) 152/98  09/07/16 (!) 151/76  05/17/16 134/82   Persistent elevations Will start medication--losartan/hctz

## 2016-09-30 NOTE — Progress Notes (Signed)
Subjective:    Patient ID: Benjamin Cortez, male    DOB: 1945-10-12, 71 y.o.   MRN: [id number]  HPI Here due to elevated BP At home, as high as 198/97 High with Dr Tresa Moore as well No chest pain now No edema No headaches Some lethargy Long standing borderline high BP in past  Had TURBT---will need regular cystoscopies Some right back pain--under rib and in flank since that time Doesn't feel muscular--feels deeper No cough, SOB or fever  Current Outpatient Prescriptions on File Prior to Visit  Medication Sig Dispense Refill  . aspirin 325 MG tablet Take 325 mg by mouth daily.      . cetirizine (ZYRTEC) 10 MG tablet Take 10 mg by mouth daily. Rotates weekly claritin and zyrtec    . guaiFENesin (MUCINEX) 600 MG 12 hr tablet Take 1,200 mg by mouth 2 (two) times daily as needed.     . loratadine (CLARITIN) 10 MG tablet Take 10 mg by mouth daily.    . Multiple Vitamin (MULTIVITAMIN) tablet Take 1 tablet by mouth daily.    Marland Kitchen omeprazole (PRILOSEC) 20 MG capsule Take 20 mg by mouth daily.      Marland Kitchen senna-docusate (SENOKOT-S) 8.6-50 MG tablet Take 1 tablet by mouth 2 (two) times daily. While taking pain meds to prevent constipation. 20 tablet 0  . triamcinolone cream (KENALOG) 0.1 % Apply 1 application topically daily.     No current facility-administered medications on file prior to visit.     Allergies  Allergen Reactions  . Tetracycline     Rash, blisters  . Penicillins     As a child--rash  . Codeine Nausea Only    Past Medical History:  Diagnosis Date  . Allergic rhinitis   . Asthmatic bronchitis 1997   controlled with meds-change in work mold exposure  . Attention deficit disorder    Meds in past--stopped with retirement  . Cancer ([place]) 08/2016   bladder  . GERD (gastroesophageal reflux disease)   . History of colonic polyps    Benjamin Cortez  . History of kidney stones 1970's   stone basket extraction  . Ocular migraine   . Plantar fasciitis 2018    Past Surgical History:    Procedure Laterality Date  . APPENDECTOMY  1959  . CHOLECYSTECTOMY  2005  . COLONOSCOPY    . CYSTOSCOPY WITH RETROGRADE PYELOGRAM, URETEROSCOPY AND STENT PLACEMENT Right 09/07/2016   Procedure: CYSTOSCOPY WITH RIGHT RETROGRADE PYELOGRAM;  Surgeon: Alexis Frock, MD;  Location: [facility];  Service: Urology;  Laterality: Right;  . KIDNEY STONE SURGERY  1970's   Basket extraction  . TONSILLECTOMY  1959  . TRANSURETHRAL RESECTION OF BLADDER TUMOR N/A 09/07/2016   Procedure: TRANSURETHRAL RESECTION OF BLADDER TUMOR (TURBT);  Surgeon: Alexis Frock, MD;  Location: [facility];  Service: Urology;  Laterality: N/A;    Family History  Problem Relation Age of Onset  . Colon cancer Neg Hx   . Stomach cancer Neg Hx   . Esophageal cancer Neg Hx   . Rectal cancer Neg Hx     Social History   Social History  . Marital status: Married    Spouse name: N/A  . Number of children: 2  . Years of education: N/A   Occupational History  . Scientist, clinical (histocompatibility and immunogenetics)     retired 2014   Social History Main Topics  . Smoking status: Former Smoker    Packs/day: 1.00    Years: 16.00    Quit date:  02/07/1977  . Smokeless tobacco: Never Used  . Alcohol use Yes     Comment: rare use  . Drug use: No  . Sexual activity: Yes   Other Topics Concern  . Not on file   Social History Narrative   No living will   Would want wife to make health care decisions   Would accept resuscitation attempts   Not sure about tube feeds   Review of Systems Trying to reduce sugar--weight down a few pounds Sleeps okay Trying to use treadmill or elliptical reguarly    Objective:   Physical Exam  Constitutional: He appears well-nourished. No distress.  Neck: No thyromegaly present.  Cardiovascular: Normal rate, regular rhythm and normal heart sounds.  Exam reveals no gallop.   No murmur heard. Pulmonary/Chest: Effort normal and breath sounds normal. No respiratory distress. He has no  wheezes. He has no rales. He exhibits no tenderness.  Abdominal: Soft. He exhibits no distension. There is no tenderness. There is no rebound and no guarding.  Musculoskeletal: He exhibits no edema.  Lymphadenopathy:    He has no cervical adenopathy.          Assessment & Plan:

## 2016-09-30 NOTE — Patient Instructions (Signed)
DASH Eating Plan DASH stands for "Dietary Approaches to Stop Hypertension." The DASH eating plan is a healthy eating plan that has been shown to reduce high blood pressure (hypertension). It may also reduce your risk for type 2 diabetes, heart disease, and stroke. The DASH eating plan may also help with weight loss. What are tips for following this plan? General guidelines  Avoid eating more than 2,300 mg (milligrams) of salt (sodium) a day. If you have hypertension, you may need to reduce your sodium intake to 1,500 mg a day.  Limit alcohol intake to no more than 1 drink a day for nonpregnant women and 2 drinks a day for men. One drink equals 12 oz of beer, 5 oz of wine, or 1 oz of hard liquor.  Work with your health care provider to maintain a healthy body weight or to lose weight. Ask what an ideal weight is for you.  Get at least 30 minutes of exercise that causes your heart to beat faster (aerobic exercise) most days of the week. Activities may include walking, swimming, or biking.  Work with your health care provider or diet and nutrition specialist (dietitian) to adjust your eating plan to your individual calorie needs. Reading food labels  Check food labels for the amount of sodium per serving. Choose foods with less than 5 percent of the Daily Value of sodium. Generally, foods with less than 300 mg of sodium per serving fit into this eating plan.  To find whole grains, look for the word "whole" as the first word in the ingredient list. Shopping  Buy products labeled as "low-sodium" or "no salt added."  Buy fresh foods. Avoid canned foods and premade or frozen meals. Cooking  Avoid adding salt when cooking. Use salt-free seasonings or herbs instead of table salt or sea salt. Check with your health care provider or pharmacist before using salt substitutes.  Do not fry foods. Cook foods using healthy methods such as baking, boiling, grilling, and broiling instead.  Cook with  heart-healthy oils, such as olive, canola, soybean, or sunflower oil. Meal planning   Eat a balanced diet that includes: ? 5 or more servings of fruits and vegetables each day. At each meal, try to fill half of your plate with fruits and vegetables. ? Up to 6-8 servings of whole grains each day. ? Less than 6 oz of lean meat, poultry, or fish each day. A 3-oz serving of meat is about the same size as a deck of cards. One egg equals 1 oz. ? 2 servings of low-fat dairy each day. ? A serving of nuts, seeds, or beans 5 times each week. ? Heart-healthy fats. Healthy fats called Omega-3 fatty acids are found in foods such as flaxseeds and coldwater fish, like sardines, salmon, and mackerel.  Limit how much you eat of the following: ? Canned or prepackaged foods. ? Food that is high in trans fat, such as fried foods. ? Food that is high in saturated fat, such as fatty meat. ? Sweets, desserts, sugary drinks, and other foods with added sugar. ? Full-fat dairy products.  Do not salt foods before eating.  Try to eat at least 2 vegetarian meals each week.  Eat more home-cooked food and less restaurant, buffet, and fast food.  When eating at a restaurant, ask that your food be prepared with less salt or no salt, if possible. What foods are recommended? The items listed may not be a complete list. Talk with your dietitian about what   dietary choices are best for you. Grains Whole-grain or whole-wheat bread. Whole-grain or whole-wheat pasta. Brown rice. Oatmeal. Quinoa. Bulgur. Whole-grain and low-sodium cereals. Pita bread. Low-fat, low-sodium crackers. Whole-wheat flour tortillas. Vegetables Fresh or frozen vegetables (raw, steamed, roasted, or grilled). Low-sodium or reduced-sodium tomato and vegetable juice. Low-sodium or reduced-sodium tomato sauce and tomato paste. Low-sodium or reduced-sodium canned vegetables. Fruits All fresh, dried, or frozen fruit. Canned fruit in natural juice (without  added sugar). Meat and other protein foods Skinless chicken or turkey. Ground chicken or turkey. Pork with fat trimmed off. Fish and seafood. Egg whites. Dried beans, peas, or lentils. Unsalted nuts, nut butters, and seeds. Unsalted canned beans. Lean cuts of beef with fat trimmed off. Low-sodium, lean deli meat. Dairy Low-fat (1%) or fat-free (skim) milk. Fat-free, low-fat, or reduced-fat cheeses. Nonfat, low-sodium ricotta or cottage cheese. Low-fat or nonfat yogurt. Low-fat, low-sodium cheese. Fats and oils Soft margarine without trans fats. Vegetable oil. Low-fat, reduced-fat, or light mayonnaise and salad dressings (reduced-sodium). Canola, safflower, olive, soybean, and sunflower oils. Avocado. Seasoning and other foods Herbs. Spices. Seasoning mixes without salt. Unsalted popcorn and pretzels. Fat-free sweets. What foods are not recommended? The items listed may not be a complete list. Talk with your dietitian about what dietary choices are best for you. Grains Baked goods made with fat, such as croissants, muffins, or some breads. Dry pasta or rice meal packs. Vegetables Creamed or fried vegetables. Vegetables in a cheese sauce. Regular canned vegetables (not low-sodium or reduced-sodium). Regular canned tomato sauce and paste (not low-sodium or reduced-sodium). Regular tomato and vegetable juice (not low-sodium or reduced-sodium). Pickles. Olives. Fruits Canned fruit in a light or heavy syrup. Fried fruit. Fruit in cream or butter sauce. Meat and other protein foods Fatty cuts of meat. Ribs. Fried meat. Bacon. Sausage. Bologna and other processed lunch meats. Salami. Fatback. Hotdogs. Bratwurst. Salted nuts and seeds. Canned beans with added salt. Canned or smoked fish. Whole eggs or egg yolks. Chicken or turkey with skin. Dairy Whole or 2% milk, cream, and half-and-half. Whole or full-fat cream cheese. Whole-fat or sweetened yogurt. Full-fat cheese. Nondairy creamers. Whipped toppings.  Processed cheese and cheese spreads. Fats and oils Butter. Stick margarine. Lard. Shortening. Ghee. Bacon fat. Tropical oils, such as coconut, palm kernel, or palm oil. Seasoning and other foods Salted popcorn and pretzels. Onion salt, garlic salt, seasoned salt, table salt, and sea salt. Worcestershire sauce. Tartar sauce. Barbecue sauce. Teriyaki sauce. Soy sauce, including reduced-sodium. Steak sauce. Canned and packaged gravies. Fish sauce. Oyster sauce. Cocktail sauce. Horseradish that you find on the shelf. Ketchup. Mustard. Meat flavorings and tenderizers. Bouillon cubes. Hot sauce and Tabasco sauce. Premade or packaged marinades. Premade or packaged taco seasonings. Relishes. Regular salad dressings. Where to find more information:  National Heart, Lung, and Blood Institute: www.nhlbi.nih.gov  American Heart Association: www.heart.org Summary  The DASH eating plan is a healthy eating plan that has been shown to reduce high blood pressure (hypertension). It may also reduce your risk for type 2 diabetes, heart disease, and stroke.  With the DASH eating plan, you should limit salt (sodium) intake to 2,300 mg a day. If you have hypertension, you may need to reduce your sodium intake to 1,500 mg a day.  When on the DASH eating plan, aim to eat more fresh fruits and vegetables, whole grains, lean proteins, low-fat dairy, and heart-healthy fats.  Work with your health care provider or diet and nutrition specialist (dietitian) to adjust your eating plan to your individual   calorie needs. This information is not intended to replace advice given to you by your health care provider. Make sure you discuss any questions you have with your health care provider. Document Released: 01/13/2011 Document Revised: 01/18/2016 Document Reviewed: 01/18/2016 Elsevier Interactive Patient Education  2017 Elsevier Inc.  

## 2016-10-17 ENCOUNTER — Telehealth: Payer: Self-pay

## 2016-10-17 NOTE — Telephone Encounter (Signed)
Pt left v/m; pt seen 09/30/16 and losartan HCTZ 50-12.5 mg was started for hypertension. Pt having difficulty functioning due to being extremely tired with episodes of dizziness and at times feels like will pass out.pt request cb with what to do. [facility]. Dr Silvio Pate not in until 10:30.Please advise.

## 2016-10-17 NOTE — Telephone Encounter (Signed)
Spoke to pt and scheduled for 9/12

## 2016-10-17 NOTE — Telephone Encounter (Signed)
Spoke to pt and advised. He has an appt with Dr Silvio Pate 10/5. Is that ok?

## 2016-10-17 NOTE — Telephone Encounter (Signed)
He should probably be seen this week

## 2016-10-17 NOTE — Telephone Encounter (Signed)
Have him cut tablets in half. Does he have follow up with Dr. Silvio Pate scheduled.

## 2016-10-19 ENCOUNTER — Encounter: Payer: Self-pay | Admitting: Internal Medicine

## 2016-10-19 ENCOUNTER — Ambulatory Visit (INDEPENDENT_AMBULATORY_CARE_PROVIDER_SITE_OTHER): Payer: PPO | Admitting: Internal Medicine

## 2016-10-19 VITALS — BP 140/86 | HR 53 | Temp 98.0°F | Wt 214.0 lb

## 2016-10-19 DIAGNOSIS — R42 Dizziness and giddiness: Secondary | ICD-10-CM

## 2016-10-19 DIAGNOSIS — Z23 Encounter for immunization: Secondary | ICD-10-CM

## 2016-10-19 MED ORDER — LOSARTAN POTASSIUM-HCTZ 50-12.5 MG PO TABS: 0.5000 | ORAL_TABLET | Freq: Every day | ORAL | 0 refills | Status: DC

## 2016-10-19 NOTE — Assessment & Plan Note (Signed)
BP Readings from Last 3 Encounters:  10/19/16 140/86  09/30/16 (!) 152/98  09/07/16 (!) 151/76   For now--will continue the 1/2 tab daily

## 2016-10-19 NOTE — Assessment & Plan Note (Signed)
With crunches and getting up quick from bending over--not with aerobic activity Better since cutting BP med in half Will continue this for now and he will let me know if he has any recurrent symptoms

## 2016-10-19 NOTE — Progress Notes (Signed)
Subjective:    Patient ID: Benjamin Cortez, male    DOB: March 25, 1945, 71 y.o.   MRN: [id number]  HPI Here due to dizziness  Did start the BP med Trying to do his regular exercise routine---doing crunches and almost passed out Didn't notice the symptoms while on the treadmill or elliptical Stopped and felt fine Later trying to pick up rock--and "almost fell out" Sapped of energy since then Had soreness in chest as well--non specific  Doing better now on the 1/2 pill Feels okay now Still will have some balance issues if he turns quickly  BP today 146/82 on the 1/2 tab  Current Outpatient Prescriptions on File Prior to Visit  Medication Sig Dispense Refill  . aspirin 325 MG tablet Take 325 mg by mouth daily.      . cetirizine (ZYRTEC) 10 MG tablet Take 10 mg by mouth daily. Rotates weekly claritin and zyrtec    . guaiFENesin (MUCINEX) 600 MG 12 hr tablet Take 1,200 mg by mouth 2 (two) times daily as needed.     . loratadine (CLARITIN) 10 MG tablet Take 10 mg by mouth daily.    Marland Kitchen losartan-hydrochlorothiazide (HYZAAR) 50-12.5 MG tablet Take 1 tablet by mouth daily. 90 tablet 3  . Multiple Vitamin (MULTIVITAMIN) tablet Take 1 tablet by mouth daily.    Marland Kitchen omeprazole (PRILOSEC) 20 MG capsule Take 20 mg by mouth daily.      Marland Kitchen triamcinolone cream (KENALOG) 0.1 % Apply 1 application topically daily.     No current facility-administered medications on file prior to visit.     Allergies  Allergen Reactions  . Tetracycline     Rash, blisters  . Penicillins     As a child--rash  . Codeine Nausea Only    Past Medical History:  Diagnosis Date  . Allergic rhinitis   . Asthmatic bronchitis 1997   controlled with meds-change in work mold exposure  . Attention deficit disorder    Meds in past--stopped with retirement  . Cancer ([place]) 08/2016   bladder  . GERD (gastroesophageal reflux disease)   . History of colonic polyps    [name]  . History of kidney stones 1970's   stone basket  extraction  . Ocular migraine   . Plantar fasciitis 2018    Past Surgical History:  Procedure Laterality Date  . APPENDECTOMY  1959  . CHOLECYSTECTOMY  2005  . COLONOSCOPY    . CYSTOSCOPY WITH RETROGRADE PYELOGRAM, URETEROSCOPY AND STENT PLACEMENT Right 09/07/2016   Procedure: CYSTOSCOPY WITH RIGHT RETROGRADE PYELOGRAM;  Surgeon: Alexis Frock, MD;  Location[facility];  Service: Urology;  Laterality: Right;  . KIDNEY STONE SURGERY  1970's   Basket extraction  . TONSILLECTOMY  1959  . TRANSURETHRAL RESECTION OF BLADDER TUMOR N/A 09/07/2016   Procedure: TRANSURETHRAL RESECTION OF BLADDER TUMOR (TURBT);  Surgeon: Alexis Frock, MD;  Location: [facility];  Service: Urology;  Laterality: N/A;    Family History  Problem Relation Age of Onset  . Colon cancer Neg Hx   . Stomach cancer Neg Hx   . Esophageal cancer Neg Hx   . Rectal cancer Neg Hx     Social History   Social History  . Marital status: Married    Spouse name: N/A  . Number of children: 2  . Years of education: N/A   Occupational History  . Scientist, clinical (histocompatibility and immunogenetics)     retired 2014   Social History Main Topics  . Smoking status: Former Smoker  Packs/day: 1.00    Years: 16.00    Quit date: 02/07/1977  . Smokeless tobacco: Never Used  . Alcohol use Yes     Comment: rare use  . Drug use: No  . Sexual activity: Yes   Other Topics Concern  . Not on file   Social History Narrative   No living will   Would want wife to make health care decisions   Would accept resuscitation attempts   Not sure about tube feeds   Review of Systems  Appetite is okay Sleeping well---seemed to be better on the med, due to fatigue Gets pain in RUQ which radiates around flank and back--goes back 2 months and will last a day or so. No rash. Discussed that this sounds radicular     Objective:   Physical Exam  Constitutional: He appears well-nourished. No distress.  Cardiovascular: Normal rate,  regular rhythm and normal heart sounds.  Exam reveals no gallop.   No murmur heard. Pulmonary/Chest: Effort normal and breath sounds normal. No respiratory distress. He has no wheezes. He has no rales.  Musculoskeletal: He exhibits no edema.          Assessment & Plan:

## 2016-10-23 NOTE — Progress Notes (Signed)
Will get him an appointment for a colonoscopy to f/u polyps   Barbie Haggis will you arrange a direct colonoscopy appointment f/u polyps

## 2016-10-24 ENCOUNTER — Telehealth: Payer: Self-pay | Admitting: Internal Medicine

## 2016-10-24 NOTE — Telephone Encounter (Signed)
Dr Carlean Purl, there is a Recall ECL for October 2018.  Is that still accurate or just schedule him for a Direct Colonoscopy at this time?

## 2016-10-24 NOTE — Telephone Encounter (Signed)
He does not need an EGD just needs a colonoscopy. There is a history of Barrett's esophagus but I believe that diagnosis is no longer current an accurate so I don't think he needs an EGD. He does not have to be scheduled with me necessarily could be a direct for one of the other less busy physicians.

## 2016-10-25 ENCOUNTER — Encounter: Payer: Self-pay | Admitting: Gastroenterology

## 2016-11-02 ENCOUNTER — Telehealth: Payer: Self-pay | Admitting: *Deleted

## 2016-11-02 NOTE — Telephone Encounter (Signed)
noted 

## 2016-11-02 NOTE — Telephone Encounter (Signed)
Dr Havery Moros: pt is scheduled for recall colonoscopy 10/18.  Last colonoscopy 2013 with Dr Olevia Perches.  Pt TURP for bladder tumor 09/24/16.  I do not see that he is on chemotherapy or radiation.  Is pt okay for direct colonoscopy or does he need OV first?  Thanks, Juliann Pulse in [facility]

## 2016-11-02 NOTE — Telephone Encounter (Signed)
I think he should be okay to proceed as scheduled if he has otherwise recovered okay from his surgery

## 2016-11-10 ENCOUNTER — Encounter: Payer: Self-pay | Admitting: Gastroenterology

## 2016-11-10 ENCOUNTER — Ambulatory Visit (AMBULATORY_SURGERY_CENTER): Payer: Self-pay | Admitting: *Deleted

## 2016-11-10 VITALS — Ht 66.0 in | Wt 211.4 lb

## 2016-11-10 DIAGNOSIS — Z86010 Personal history of colonic polyps: Secondary | ICD-10-CM

## 2016-11-10 MED ORDER — NA SULFATE-K SULFATE-MG SULF 17.5-3.13-1.6 GM/177ML PO SOLN: 1.0000 [IU] | Freq: Once | ORAL | 0 refills | Status: AC

## 2016-11-10 NOTE — Progress Notes (Signed)
No egg or soy allergy known to patient  No issues with past sedation with any surgeries  or procedures, no intubation problems  No diet pills per patient No home 02 use per patient  No blood thinners per patient  Pt denies issues with constipation   No A fib or A flutter  EMMI video sent to pt's e mail pt. decline

## 2016-11-11 ENCOUNTER — Ambulatory Visit (INDEPENDENT_AMBULATORY_CARE_PROVIDER_SITE_OTHER): Payer: PPO | Admitting: Internal Medicine

## 2016-11-11 ENCOUNTER — Encounter: Payer: Self-pay | Admitting: Internal Medicine

## 2016-11-11 VITALS — BP 118/68 | HR 51 | Temp 98.0°F | Resp 16 | Wt 211.5 lb

## 2016-11-11 DIAGNOSIS — I10 Essential (primary) hypertension: Secondary | ICD-10-CM

## 2016-11-11 LAB — RENAL FUNCTION PANEL
ALBUMIN: 4 g/dL (ref 3.5–5.2)
BUN: 16 mg/dL (ref 6–23)
CHLORIDE: 104 meq/L (ref 96–112)
CO2: 29 mEq/L (ref 19–32)
Calcium: 9.4 mg/dL (ref 8.4–10.5)
Creatinine, Ser: 1.2 mg/dL (ref 0.40–1.50)
GFR: 63.85 mL/min (ref >60.00)
Glucose, Bld: 97 mg/dL (ref 70–99)
Phosphorus: 3 mg/dL (ref 2.3–4.6)
Potassium: 4.6 mEq/L (ref 3.5–5.1)
SODIUM: 140 meq/L (ref 135–145)

## 2016-11-11 NOTE — Assessment & Plan Note (Signed)
BP Readings from Last 3 Encounters:  11/11/16 118/68  10/19/16 140/86  09/30/16 (!) 152/98   Better now and tolerating the medication Will check renal profile

## 2016-11-11 NOTE — Progress Notes (Signed)
Subjective:    Patient ID: Benjamin Cortez, male    DOB: 1945/09/24, 71 y.o.   MRN: [id number]  HPI Here for follow up of HTN  Still taking the 1/2 tab daily "It has made a big difference" He is eating a banana daily---makes him feel better  Still will get some dizziness if bending down for a while and jumps up too fast No chest  Pain No SOB No problems with aerobic exercise  Current Outpatient Prescriptions on File Prior to Visit  Medication Sig Dispense Refill  . aspirin 325 MG tablet Take 325 mg by mouth daily.      . cetirizine (ZYRTEC) 10 MG tablet Take 10 mg by mouth daily. Rotates weekly claritin and zyrtec    . guaiFENesin (MUCINEX) 600 MG 12 hr tablet Take 1,200 mg by mouth 2 (two) times daily as needed.     . loratadine (CLARITIN) 10 MG tablet Take 10 mg by mouth daily.    Marland Kitchen losartan-hydrochlorothiazide (HYZAAR) 50-12.5 MG tablet Take 0.5 tablets by mouth daily. 1 tablet 0  . Multiple Vitamin (MULTIVITAMIN) tablet Take 1 tablet by mouth daily.    Marland Kitchen omeprazole (PRILOSEC) 20 MG capsule Take 20 mg by mouth daily.      Marland Kitchen triamcinolone cream (KENALOG) 0.1 % Apply 1 application topically daily.     No current facility-administered medications on file prior to visit.     Allergies  Allergen Reactions  . Tetracycline     Rash, blisters  . Penicillins     As a child--rash  . Codeine Nausea Only    Past Medical History:  Diagnosis Date  . Allergic rhinitis   . Allergy   . Asthmatic bronchitis 1997   controlled with meds-change in work mold exposure  . Attention deficit disorder    Meds in past--stopped with retirement  . Cancer ([place]) 08/2016   bladder  . Chronic kidney disease    kidney stones  . GERD (gastroesophageal reflux disease)   . History of colonic polyps    [name]  . History of kidney stones 1970's   stone basket extraction  . Hypertension   . Ocular migraine   . Plantar fasciitis 2018    Past Surgical History:  Procedure Laterality Date  .  APPENDECTOMY  1959  . CHOLECYSTECTOMY  2005  . COLONOSCOPY    . CYSTOSCOPY WITH RETROGRADE PYELOGRAM, URETEROSCOPY AND STENT PLACEMENT Right 09/07/2016   Procedure: CYSTOSCOPY WITH RIGHT RETROGRADE PYELOGRAM;  Surgeon: Alexis Frock, MD;  Location: [facility];  Service: Urology;  Laterality: Right;  . KIDNEY STONE SURGERY  1970's   Basket extraction  . POLYPECTOMY    . TONSILLECTOMY  1959  . TRANSURETHRAL RESECTION OF BLADDER TUMOR N/A 09/07/2016   Procedure: TRANSURETHRAL RESECTION OF BLADDER TUMOR (TURBT);  Surgeon: Alexis Frock, MD;  Location: [facility];  Service: Urology;  Laterality: N/A;    Family History  Problem Relation Age of Onset  . Colon cancer Neg Hx   . Stomach cancer Neg Hx   . Esophageal cancer Neg Hx   . Rectal cancer Neg Hx   . Colon polyps Neg Hx     Social History   Social History  . Marital status: Married    Spouse name: N/A  . Number of children: 2  . Years of education: N/A   Occupational History  . Scientist, clinical (histocompatibility and immunogenetics)     retired 2014   Social History Main Topics  . Smoking status: Former Smoker  Packs/day: 1.00    Years: 16.00    Quit date: 02/07/1977  . Smokeless tobacco: Never Used  . Alcohol use No  . Drug use: No  . Sexual activity: Yes   Other Topics Concern  . Not on file   Social History Narrative   No living will   Would want wife to make health care decisions   Would accept resuscitation attempts   Not sure about tube feeds   Review of Systems Appetite is good Sleeps fairly well--does have nocturia though    Objective:   Physical Exam  Constitutional: He appears well-nourished. No distress.  Cardiovascular: Normal rate, regular rhythm and normal heart sounds.  Exam reveals no gallop.   No murmur heard. Pulmonary/Chest: Effort normal and breath sounds normal. No respiratory distress. He has no wheezes. He has no rales.  Musculoskeletal: He exhibits no edema.          Assessment &  Plan:

## 2016-11-24 ENCOUNTER — Ambulatory Visit (AMBULATORY_SURGERY_CENTER): Payer: PPO | Admitting: Gastroenterology

## 2016-11-24 ENCOUNTER — Encounter: Payer: Self-pay | Admitting: Gastroenterology

## 2016-11-24 VITALS — BP 112/60 | HR 55 | Temp 97.5°F | Resp 13

## 2016-11-24 DIAGNOSIS — D122 Benign neoplasm of ascending colon: Secondary | ICD-10-CM | POA: Diagnosis not present

## 2016-11-24 DIAGNOSIS — J45909 Unspecified asthma, uncomplicated: Secondary | ICD-10-CM | POA: Diagnosis not present

## 2016-11-24 DIAGNOSIS — D120 Benign neoplasm of cecum: Secondary | ICD-10-CM

## 2016-11-24 DIAGNOSIS — D123 Benign neoplasm of transverse colon: Secondary | ICD-10-CM | POA: Diagnosis not present

## 2016-11-24 DIAGNOSIS — I10 Essential (primary) hypertension: Secondary | ICD-10-CM | POA: Diagnosis not present

## 2016-11-24 DIAGNOSIS — Z86010 Personal history of colonic polyps: Secondary | ICD-10-CM | POA: Diagnosis not present

## 2016-11-24 DIAGNOSIS — B839 Helminthiasis, unspecified: Secondary | ICD-10-CM | POA: Diagnosis not present

## 2016-11-24 MED ORDER — SODIUM CHLORIDE 0.9 % IV SOLN: 500.0000 mL | INTRAVENOUS | Status: DC

## 2016-11-24 NOTE — Progress Notes (Signed)
Pt's states no medical or surgical changes since previsit or office visit. maw 

## 2016-11-24 NOTE — Progress Notes (Signed)
Called to room to assist during endoscopic procedure.  Patient ID and intended procedure confirmed with present staff. Received instructions for my participation in the procedure from the performing physician. maw 

## 2016-11-24 NOTE — Patient Instructions (Signed)
YOU HAD AN ENDOSCOPIC PROCEDURE TODAY AT THE [facility]:   Refer to the procedure report that was given to you for any specific questions about what was found during the examination.  If the procedure report does not answer your questions, please call your gastroenterologist to clarify.  If you requested that your care partner not be given the details of your procedure findings, then the procedure report has been included in a sealed envelope for you to review at your convenience later.  YOU SHOULD EXPECT: Some feelings of bloating in the abdomen. Passage of more gas than usual.  Walking can help get rid of the air that was put into your GI tract during the procedure and reduce the bloating. If you had a lower endoscopy (such as a colonoscopy or flexible sigmoidoscopy) you may notice spotting of blood in your stool or on the toilet paper. If you underwent a bowel prep for your procedure, you may not have a normal bowel movement for a few days.  Please Note:  You might notice some irritation and congestion in your nose or some drainage.  This is from the oxygen used during your procedure.  There is no need for concern and it should clear up in a day or so.  SYMPTOMS TO REPORT IMMEDIATELY:   Following lower endoscopy (colonoscopy or flexible sigmoidoscopy):  Excessive amounts of blood in the stool  Significant tenderness or worsening of abdominal pains  Swelling of the abdomen that is new, acute  Fever of 100F or higher    For urgent or emergent issues, a gastroenterologist can be reached at any hour by calling [phone].   DIET:  We do recommend a small meal at first, but then you may proceed to your regular diet.  Drink plenty of fluids but you should avoid alcoholic beverages for 24 hours.  ACTIVITY:  You should plan to take it easy for the rest of today and you should NOT DRIVE or use heavy machinery until tomorrow (because of the sedation medicines used during the test).     FOLLOW UP: Our staff will call the number listed on your records the next business day following your procedure to check on you and address any questions or concerns that you may have regarding the information given to you following your procedure. If we do not reach you, we will leave a message.  However, if you are feeling well and you are not experiencing any problems, there is no need to return our call.  We will assume that you have returned to your regular daily activities without incident.  If any biopsies were taken you will be contacted by phone or by letter within the next 1-3 weeks.  Please call us at [phone] if you have not heard about the biopsies in 3 weeks.    SIGNATURES/CONFIDENTIALITY: You and/or your care partner have signed paperwork which will be entered into your electronic medical record.  These signatures attest to the fact that that the information above on your After Visit Summary has been reviewed and is understood.  Full responsibility of the confidentiality of this discharge information lies with you and/or your care-partner  Polyp and hemorrhoid information given.. 

## 2016-11-24 NOTE — Op Note (Addendum)
[place] Patient Name: Benjamin Cortez Procedure Date: 11/24/2016 9:07 AM MRN: [id number] Endoscopist: [name] MD, MD Age: 71 Referring MD:  Date of Birth: 04-04-1945 Gender: Male Account #: [id number] Procedure:                Colonoscopy Indications:              Surveillance: Personal history of adenomatous                            polyps on last colonoscopy 5 years ago Medicines:                Monitored Anesthesia Care Procedure:                Pre-Anesthesia Assessment:                           - Prior to the procedure, a History and Physical                            was performed, and patient medications and                            allergies were reviewed. The patient's tolerance of                            previous anesthesia was also reviewed. The risks                            and benefits of the procedure and the sedation                            options and risks were discussed with the patient.                            All questions were answered, and informed consent                            was obtained. Prior Anticoagulants: The patient has                            taken no previous anticoagulant or antiplatelet                            agents. ASA Grade Assessment: II - A patient with                            mild systemic disease. After reviewing the risks                            and benefits, the patient was deemed in                            satisfactory condition to undergo the procedure.  After obtaining informed consent, the colonoscope                            was passed under direct vision. Throughout the                            procedure, the patient's blood pressure, pulse, and                            oxygen saturations were monitored continuously. The                            Colonoscope was introduced through the anus and                            advanced to the the  cecum, identified by                            appendiceal orifice and ileocecal valve. The                            colonoscopy was performed without difficulty. The                            patient tolerated the procedure well. The quality                            of the bowel preparation was adequate. The                            ileocecal valve, appendiceal orifice, and rectum                            were photographed. Scope In: 9:18:16 AM Scope Out: 9:38:57 AM Scope Withdrawal Time: 0 hours 19 minutes 17 seconds  Total Procedure Duration: 0 hours 20 minutes 41 seconds  Findings:                 The perianal and digital rectal examinations were                            normal.                           Two sessile polyps were found in the cecum. The                            polyps were 3 to 5 mm in size. These polyps were                            removed with a cold snare. Resection and retrieval                            were complete.  Six sessile polyps were found in the ascending                            colon. The polyps were 3 to 5 mm in size. These                            polyps were removed with a cold snare. Resection                            and retrieval were complete.                           A 4 mm polyp was found in the hepatic flexure. The                            polyp was sessile. The polyp was removed with a                            cold snare. Resection and retrieval were complete.                           Four sessile polyps were found in the transverse                            colon. The polyps were 3 to 5 mm in size. These                            polyps were removed with a cold snare. Resection                            and retrieval were complete.                           A small worm were found in the transverse colon. It                            was removed with a cold forceps for histology. I                             suspect this represents a pinworm                           Multiple medium-mouthed diverticula were found in                            the sigmoid colon.                           Internal hemorrhoids were found during retroflexion.                           The exam was otherwise without abnormality. Complications:  No immediate complications. Estimated blood loss:                            Minimal. Estimated Blood Loss:     Estimated blood loss was minimal. Impression:               - Two 3 to 5 mm polyps in the cecum, removed with a                            cold snare. Resected and retrieved.                           - Six 3 to 5 mm polyps in the ascending colon,                            removed with a cold snare. Resected and retrieved.                           - One 4 mm polyp at the hepatic flexure, removed                            with a cold snare. Resected and retrieved.                           - Four 3 to 5 mm polyps in the transverse colon,                            removed with a cold snare. Resected and retrieved.                           - One small worm removed with cold forceps -                            suspect pin worm but will await pathology results.                           - Diverticulosis in the sigmoid colon.                           - Internal hemorrhoids.                           - The examination was otherwise normal. Recommendation:           - Patient has a contact number available for                            emergencies. The signs and symptoms of potential                            delayed complications were discussed with the                            patient. Return to normal  activities tomorrow.                            Written discharge instructions were provided to the                            patient.                           - Resume previous diet.                           - Continue  present medications.                           - Await pathology results.                           - Repeat colonoscopy is recommended for                            surveillance. The colonoscopy date will be                            determined after pathology results from today's                            exam become available for review.                           - No ibuprofen, naproxen, or other non-steroidal                            anti-inflammatory drugs for 2 weeks after polyp                            removal. Remo Lipps P. Eliot Popper MD, MD 11/24/2016 9:46:28 AM This report has been signed electronically.

## 2016-11-24 NOTE — Progress Notes (Signed)
To recovery, report to RN, VSS. 

## 2016-11-25 ENCOUNTER — Telehealth: Payer: Self-pay

## 2016-11-25 NOTE — Telephone Encounter (Signed)
  Follow up Call-  Call back number 11/24/2016  Post procedure Call Back phone  # [phone]  Permission to leave phone message Yes  Some recent data might be hidden     Patient questions:  Do you have a fever, pain , or abdominal swelling? No. Pain Score  0 *  Have you tolerated food without any problems? Yes.    Have you been able to return to your normal activities? Yes.    Do you have any questions about your discharge instructions: Diet   No. Medications  No. Follow up visit  No.  Do you have questions or concerns about your Care? No.  Actions: * If pain score is 4 or above: No action needed, pain <4.

## 2017-01-19 DIAGNOSIS — C670 Malignant neoplasm of trigone of bladder: Secondary | ICD-10-CM | POA: Diagnosis not present

## 2017-02-08 DIAGNOSIS — H52223 Regular astigmatism, bilateral: Secondary | ICD-10-CM | POA: Diagnosis not present

## 2017-02-08 DIAGNOSIS — H524 Presbyopia: Secondary | ICD-10-CM | POA: Diagnosis not present

## 2017-02-08 DIAGNOSIS — H5213 Myopia, bilateral: Secondary | ICD-10-CM | POA: Diagnosis not present

## 2017-04-20 DIAGNOSIS — C670 Malignant neoplasm of trigone of bladder: Secondary | ICD-10-CM | POA: Diagnosis not present

## 2017-04-20 DIAGNOSIS — R310 Gross hematuria: Secondary | ICD-10-CM | POA: Diagnosis not present

## 2017-04-24 DIAGNOSIS — M25522 Pain in left elbow: Secondary | ICD-10-CM | POA: Diagnosis not present

## 2017-04-24 DIAGNOSIS — G5622 Lesion of ulnar nerve, left upper limb: Secondary | ICD-10-CM | POA: Diagnosis not present

## 2017-05-03 ENCOUNTER — Telehealth: Payer: Self-pay | Admitting: Internal Medicine

## 2017-05-03 NOTE — Telephone Encounter (Signed)
Spoke to [place] at the pharmacy. She said what they have been doing is separating the 2 medications into losartan and HCTZ. I advised her to go ahead and do that.

## 2017-05-03 NOTE — Telephone Encounter (Signed)
Copied from [place] [id number]. Topic: Quick Communication - See Telephone Encounter >> May 03, 2017  8:49 AM Lolita Rieger, RMA wrote: CRM for notification. See Telephone encounter for: 05/03/17.pt called and stated that the pharmacy informed him that medication losartan-hydrochlorothiazide (HYZAAR) 50-12.5 MG tablet [[id number]] is on back order and pt was told to contact office to have something else prescribed

## 2017-05-03 NOTE — Telephone Encounter (Signed)
Find out what comparable ARB the pharmacy has

## 2017-05-15 ENCOUNTER — Ambulatory Visit: Payer: PPO

## 2017-05-19 ENCOUNTER — Encounter: Payer: Self-pay | Admitting: Internal Medicine

## 2017-05-19 ENCOUNTER — Ambulatory Visit (INDEPENDENT_AMBULATORY_CARE_PROVIDER_SITE_OTHER): Payer: PPO | Admitting: Internal Medicine

## 2017-05-19 VITALS — BP 136/80 | HR 53 | Temp 98.0°F | Ht 67.0 in | Wt 202.0 lb

## 2017-05-19 DIAGNOSIS — Z7189 Other specified counseling: Secondary | ICD-10-CM

## 2017-05-19 DIAGNOSIS — Z8551 Personal history of malignant neoplasm of bladder: Secondary | ICD-10-CM | POA: Diagnosis not present

## 2017-05-19 DIAGNOSIS — Z1159 Encounter for screening for other viral diseases: Secondary | ICD-10-CM

## 2017-05-19 DIAGNOSIS — J301 Allergic rhinitis due to pollen: Secondary | ICD-10-CM

## 2017-05-19 DIAGNOSIS — Z0000 Encounter for general adult medical examination without abnormal findings: Secondary | ICD-10-CM | POA: Diagnosis not present

## 2017-05-19 DIAGNOSIS — K219 Gastro-esophageal reflux disease without esophagitis: Secondary | ICD-10-CM

## 2017-05-19 DIAGNOSIS — I10 Essential (primary) hypertension: Secondary | ICD-10-CM | POA: Diagnosis not present

## 2017-05-19 LAB — LIPID PANEL
Cholesterol: 148 mg/dL (ref 0–200)
HDL: 46.7 mg/dL (ref >39.00)
LDL CALC: 75 mg/dL (ref 0–99)
NonHDL: 101.43
TRIGLYCERIDES: 134 mg/dL (ref 0.0–149.0)
Total CHOL/HDL Ratio: 3
VLDL: 26.8 mg/dL (ref 0.0–40.0)

## 2017-05-19 LAB — COMPREHENSIVE METABOLIC PANEL
ALT: 18 U/L (ref 0–53)
AST: 20 U/L (ref 0–37)
Albumin: 4 g/dL (ref 3.5–5.2)
Alkaline Phosphatase: 99 U/L (ref 39–117)
BILIRUBIN TOTAL: 0.7 mg/dL (ref 0.2–1.2)
BUN: 16 mg/dL (ref 6–23)
CALCIUM: 9.2 mg/dL (ref 8.4–10.5)
CO2: 29 meq/L (ref 19–32)
Chloride: 105 mEq/L (ref 96–112)
Creatinine, Ser: 1.14 mg/dL (ref 0.40–1.50)
GFR: 67.64 mL/min (ref >60.00)
Glucose, Bld: 90 mg/dL (ref 70–99)
Potassium: 4.1 mEq/L (ref 3.5–5.1)
Sodium: 140 mEq/L (ref 135–145)
Total Protein: 6.6 g/dL (ref 6.0–8.3)

## 2017-05-19 LAB — CBC
HCT: 43 % (ref 39.0–52.0)
Hemoglobin: 15.1 g/dL (ref 13.0–17.0)
MCHC: 35.2 g/dL (ref 30.0–36.0)
MCV: 92.7 fl (ref 78.0–100.0)
Platelets: 231 10*3/uL (ref 150.0–400.0)
RBC: 4.64 Mil/uL (ref 4.22–5.81)
RDW: 12.8 % (ref 11.5–15.5)
WBC: 5.1 10*3/uL (ref 4.0–10.5)

## 2017-05-19 MED ORDER — TETANUS-DIPHTHERIA TOXOIDS TD 5-2 LFU IM INJ: 0.5000 mL | INJECTION | Freq: Once | INTRAMUSCULAR | 0 refills | Status: AC

## 2017-05-19 NOTE — Progress Notes (Signed)
Hearing Screening   Method: Audiometry   125Hz  250Hz  500Hz  1000Hz  2000Hz  3000Hz  4000Hz  6000Hz  8000Hz   Right ear:   20 20 20  20     Left ear:   25 25 40  0    Vision Screening Comments: January 2019

## 2017-05-19 NOTE — Progress Notes (Signed)
Subjective:    Patient ID: Benjamin Cortez, male    DOB: 12-02-1945, 72 y.o.   MRN: [id number]  HPI Here for Medicare wellness visit and follow up of chronic health conditions Reviewed form and advanced directives Reviewed other doctors No alcohol or tobacco Tries to exercise twice a week--discussed increasing Vision is fine Mild decreased hearing on left---some trouble with word discrimination No falls No depression or anhedonia Independent with instrumental ADLs No new cognitive changes. Chronic short term issues  Having a flare of his sciatica Left hip into upper leg Better when he gets loosened up  Has been working on fitness Has lost 20# over the past year No chest pain or SOB No dizziness or syncope No edema No palpitations  History of bladder cancer Has cystoscopy coming up in 2 months  Having some bad allergy symptoms Uses zyrtec and claritin  Avoiding his basement office--- moldy (so his breathing is better)  Takes the prilosec No heartburn or dysphagia He has symptoms if he holds off on it  Current Outpatient Medications on File Prior to Visit  Medication Sig Dispense Refill  . aspirin 325 MG tablet Take 325 mg by mouth daily.      . cetirizine (ZYRTEC) 10 MG tablet Take 10 mg by mouth daily. Rotates weekly claritin and zyrtec    . guaiFENesin (MUCINEX) 600 MG 12 hr tablet Take 1,200 mg by mouth 2 (two) times daily as needed.     . loratadine (CLARITIN) 10 MG tablet Take 10 mg by mouth daily.    Marland Kitchen losartan-hydrochlorothiazide (HYZAAR) 50-12.5 MG tablet Take 0.5 tablets by mouth daily. 1 tablet 0  . Multiple Vitamin (MULTIVITAMIN) tablet Take 1 tablet by mouth daily.    Marland Kitchen omeprazole (PRILOSEC) 20 MG capsule Take 20 mg by mouth daily.      Marland Kitchen triamcinolone cream (KENALOG) 0.1 % Apply 1 application topically daily.     No current facility-administered medications on file prior to visit.     Allergies  Allergen Reactions  . Tetracycline     Rash, blisters    . Penicillins     As a child--rash  . Codeine Nausea Only    Very disoriented    Past Medical History:  Diagnosis Date  . Allergic rhinitis   . Allergy   . Asthmatic bronchitis 1997   controlled with meds-change in work mold exposure  . Attention deficit disorder    Meds in past--stopped with retirement  . Cancer ([place]) 08/2016   bladder  . Chronic kidney disease    kidney stones  . GERD (gastroesophageal reflux disease)   . History of colonic polyps    Benjamin Cortez  . History of kidney stones 1970's   stone basket extraction  . Hypertension   . Ocular migraine   . Plantar fasciitis 2018    Past Surgical History:  Procedure Laterality Date  . APPENDECTOMY  1959  . CHOLECYSTECTOMY  2005  . COLONOSCOPY    . CYSTOSCOPY WITH RETROGRADE PYELOGRAM, URETEROSCOPY AND STENT PLACEMENT Right 09/07/2016   Procedure: CYSTOSCOPY WITH RIGHT RETROGRADE PYELOGRAM;  Surgeon: Alexis Frock, MD;  Location: [facility];  Service: Urology;  Laterality: Right;  . KIDNEY STONE SURGERY  1970's   Basket extraction  . POLYPECTOMY    . TONSILLECTOMY  1959  . TRANSURETHRAL RESECTION OF BLADDER TUMOR N/A 09/07/2016   Procedure: TRANSURETHRAL RESECTION OF BLADDER TUMOR (TURBT);  Surgeon: Alexis Frock, MD;  Location: [facility];  Service: Urology;  Laterality:  N/A;    Family History  Problem Relation Age of Onset  . Colon cancer Neg Hx   . Stomach cancer Neg Hx   . Esophageal cancer Neg Hx   . Rectal cancer Neg Hx   . Prostate cancer Neg Hx   . Pancreatic cancer Neg Hx     Social History   Socioeconomic History  . Marital status: Married    Spouse name: Not on file  . Number of children: 2  . Years of education: Not on file  . Highest education level: Not on file  Occupational History  . 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 Financial resource strain: Not on file  . Food insecurity:    Worry: Not on file    Inability:  Not on file  . Transportation needs:    Medical: Not on file    Non-medical: Not on file  Tobacco Use  . Smoking status: Former Smoker    Packs/day: 1.00    Years: 16.00    Pack years: 16.00    Last attempt to quit: 02/07/1977    Years since quitting: 40.3  . Smokeless tobacco: Never Used  Substance and Sexual Activity  . Alcohol use: Yes    Comment: rerely  . Drug use: No  . Sexual activity: Yes  Lifestyle  . Physical activity:    Days per week: Not on file    Minutes per session: Not on file  . Stress: Not on file  Relationships  . Social connections:    Talks on phone: Not on file    Gets together: Not on file    Attends religious service: Not on file    Active member of club or organization: Not on file    Attends meetings of clubs or organizations: Not on file    Relationship status: Not on file  . Intimate partner violence:    Fear of current or ex partner: Not on file    Emotionally abused: Not on file    Physically abused: Not on file    Forced sexual activity: Not on file  Other Topics Concern  . Not on file  Social History Narrative   No living will   Would want wife to make health care decisions-- son Benjamin Cortez is alternate   Would accept resuscitation attempts   Not sure about tube feeds   Review of Systems Bowels okay---does have a hemorrhoid. Multiple polyps on last exam Appetite is good Sleeps well Wears seat belt Some hip pain but no major arthritic issues Teeth are okay---keeps up with dentist Sees dermatologist as needed. Eczema on lower calves. No suspicious lesions Voids okay--- slightly slow stream Had ulnar neuropathy on left. Saw Dr Delilah Shan and got course of prednisone. Mostly better now    Objective:   Physical Exam  Constitutional: He is oriented to person, place, and time. He appears well-developed. No distress.  HENT:  Mouth/Throat: Oropharynx is clear and moist. No oropharyngeal exudate.  Neck: No thyromegaly present.  Cardiovascular:  Normal rate, regular rhythm, normal heart sounds and intact distal pulses. Exam reveals no gallop.  No murmur heard. Pulmonary/Chest: Effort normal and breath sounds normal. No respiratory distress. He has no wheezes. He has no rales.  Abdominal: Soft. There is no tenderness.  Musculoskeletal: He exhibits no edema or tenderness.  Lymphadenopathy:    He has no cervical adenopathy.  Neurological: He is alert and oriented to person, place, and time.  President--- "  Trump, Obama, Bush" [id number] Recall 3/3  Skin: No rash noted. No erythema.  Psychiatric: He has a normal mood and affect. His behavior is normal.          Assessment & Plan:

## 2017-05-19 NOTE — Assessment & Plan Note (Signed)
Does fine on the medication Has failed attempts at weaning

## 2017-05-19 NOTE — Assessment & Plan Note (Signed)
BP Readings from Last 3 Encounters:  05/19/17 136/80  11/24/16 112/60  11/11/16 118/68   Good control now No change

## 2017-05-19 NOTE — Assessment & Plan Note (Signed)
Okay with OTC meds

## 2017-05-19 NOTE — Assessment & Plan Note (Signed)
I have personally reviewed the Medicare Annual Wellness questionnaire and have noted 1. The patient's medical and social history 2. Their use of alcohol, tobacco or illicit drugs 3. Their current medications and supplements 4. The patient's functional ability including ADL's, fall risks, home safety risks and hearing or visual             impairment. 5. Diet and physical activities 6. Evidence for depression or mood disorders  The patients weight, height, BMI and visual acuity have been recorded in the chart I have made referrals, counseling and provided education to the patient based review of the above and I have provided the pt with a written personalized care plan for preventive services.  I have provided you with a copy of your personalized plan for preventive services. Please take the time to review along with your updated medication list.  Yearly flu vaccine Did have PSA by urologist Colon due again in 2021 Discussed exercise

## 2017-05-19 NOTE — Assessment & Plan Note (Signed)
Still getting surveillance cystoscopies

## 2017-05-19 NOTE — Assessment & Plan Note (Signed)
See social history 

## 2017-07-20 DIAGNOSIS — R310 Gross hematuria: Secondary | ICD-10-CM | POA: Diagnosis not present

## 2017-07-20 DIAGNOSIS — C670 Malignant neoplasm of trigone of bladder: Secondary | ICD-10-CM | POA: Diagnosis not present

## 2017-07-20 DIAGNOSIS — Z125 Encounter for screening for malignant neoplasm of prostate: Secondary | ICD-10-CM | POA: Diagnosis not present

## 2017-10-03 DIAGNOSIS — H01001 Unspecified blepharitis right upper eyelid: Secondary | ICD-10-CM | POA: Diagnosis not present

## 2017-10-03 DIAGNOSIS — H01002 Unspecified blepharitis right lower eyelid: Secondary | ICD-10-CM | POA: Diagnosis not present

## 2017-10-03 DIAGNOSIS — H01005 Unspecified blepharitis left lower eyelid: Secondary | ICD-10-CM | POA: Diagnosis not present

## 2017-10-03 DIAGNOSIS — H01004 Unspecified blepharitis left upper eyelid: Secondary | ICD-10-CM | POA: Diagnosis not present

## 2017-11-28 ENCOUNTER — Other Ambulatory Visit: Payer: Self-pay | Admitting: Internal Medicine

## 2017-11-28 NOTE — Telephone Encounter (Signed)
Verified with pt and wife that he is taking 1/2 a tab before sending in the refill.

## 2018-02-09 DIAGNOSIS — R310 Gross hematuria: Secondary | ICD-10-CM | POA: Diagnosis not present

## 2018-02-09 DIAGNOSIS — C670 Malignant neoplasm of trigone of bladder: Secondary | ICD-10-CM | POA: Diagnosis not present

## 2018-02-09 DIAGNOSIS — N202 Calculus of kidney with calculus of ureter: Secondary | ICD-10-CM | POA: Diagnosis not present

## 2018-02-09 DIAGNOSIS — R3915 Urgency of urination: Secondary | ICD-10-CM | POA: Diagnosis not present

## 2018-02-13 ENCOUNTER — Ambulatory Visit (INDEPENDENT_AMBULATORY_CARE_PROVIDER_SITE_OTHER): Payer: PPO | Admitting: Family Medicine

## 2018-02-13 ENCOUNTER — Encounter: Payer: Self-pay | Admitting: Family Medicine

## 2018-02-13 VITALS — BP 118/70 | HR 85 | Temp 98.5°F | Ht 67.0 in | Wt 215.0 lb

## 2018-02-13 DIAGNOSIS — J181 Lobar pneumonia, unspecified organism: Secondary | ICD-10-CM | POA: Diagnosis not present

## 2018-02-13 DIAGNOSIS — J189 Pneumonia, unspecified organism: Secondary | ICD-10-CM | POA: Insufficient documentation

## 2018-02-13 DIAGNOSIS — R059 Cough, unspecified: Secondary | ICD-10-CM

## 2018-02-13 DIAGNOSIS — R05 Cough: Secondary | ICD-10-CM

## 2018-02-13 MED ORDER — LEVOFLOXACIN 500 MG PO TABS: 500.0000 mg | ORAL_TABLET | Freq: Every day | ORAL | 0 refills | Status: DC

## 2018-02-13 MED ORDER — PREDNISONE 10 MG PO TABS: ORAL_TABLET | ORAL | 0 refills | Status: DC

## 2018-02-13 MED ORDER — IPRATROPIUM-ALBUTEROL 0.5-2.5 (3) MG/3ML IN SOLN
3.0000 mL | Freq: Once | RESPIRATORY_TRACT | Status: AC
  Administered 2018-02-13: 3 mL via RESPIRATORY_TRACT

## 2018-02-13 MED ORDER — PROMETHAZINE-DM 6.25-15 MG/5ML PO SYRP: 5.0000 mL | ORAL_SOLUTION | Freq: Four times a day (QID) | ORAL | 0 refills | Status: DC | PRN

## 2018-02-13 NOTE — Assessment & Plan Note (Signed)
New- exam and history concerning for CAP. He has multiple drug allergies.  He has done well with levaquin- eRx sent for levaquin 500 mg daily x 7 days. Also send in short prednisone burst/taper. He is not resting well at night, so I have also sent in an eRx for promethazine DM to be used as needed for cough- especially at night.  He is codeine allergic.  Discussed sedation precautions.  Despite having improvement of symptoms in the office with duonebs, he does not want a rescue inhaler prescription.  Advised to continue to push fluids. Call or return to clinic prn if these symptoms worsen or fail to improve as anticipated. The patient indicates understanding of these issues and agrees with the plan.

## 2018-02-13 NOTE — Progress Notes (Signed)
Subjective:   Patient ID: Benjamin Cortez, male    DOB: 11-24-1945, 73 y.o.   MRN: [id number]  Benjamin Cortez is a pleasant 73 y.o. year old male who presents to clinic today with Cough (Patient is here today C/O a cough with chest congestion.  Sx started 12.30.19.  Intermittent productive cough with chunks of yellow-gray stuff.  Cannot lay flat and has to sleep in a recliner.  Tx with Guaf/Detro/pseudofed but Sx persist.  H/O pneumonia and pleurisy.Benjamin Cortez catch his breath.)  on 02/13/2018  HPI: Pt of Dr. Silvio Pate, new to me, here for acute visit.  Cough, congestion, wheezing started on 01/3018. Cough is productive of yellow grey sputum. Has not been able to lay flat last couple of nights.  Has to sleep in a recliner.  H/o PNA and pleurisy in past.  He also has h/o allergic rhinitis, allergic asthma which has improved dramatically since he retired from his job.  He feels he was exposed to mold for years. No longer has a rescue inhaler and prefer not to have one- he does not feel they help.  Current Outpatient Medications on File Prior to Visit  Medication Sig Dispense Refill  . aspirin 325 MG tablet Take 325 mg by mouth daily.      . cetirizine (ZYRTEC) 10 MG tablet Take 10 mg by mouth daily. Rotates weekly claritin and zyrtec    . guaiFENesin (MUCINEX) 600 MG 12 hr tablet Take 1,200 mg by mouth 2 (two) times daily as needed.     . loratadine (CLARITIN) 10 MG tablet Take 10 mg by mouth daily.    Marland Kitchen losartan-hydrochlorothiazide (HYZAAR) 50-12.5 MG tablet Take 0.5 tablets by mouth daily. 1 tablet 0  . Multiple Vitamin (MULTIVITAMIN) tablet Take 1 tablet by mouth daily.    Marland Kitchen omeprazole (PRILOSEC) 20 MG capsule Take 20 mg by mouth daily.      Marland Kitchen triamcinolone cream (KENALOG) 0.1 % Apply 1 application topically daily.     No current facility-administered medications on file prior to visit.     Allergies  Allergen Reactions  . Tetracycline     Rash, blisters  . Penicillins     As a child--rash    . Codeine Nausea Only    Very disoriented    Past Medical History:  Diagnosis Date  . Allergic rhinitis   . Allergy   . Asthmatic bronchitis 1997   controlled with meds-change in work mold exposure  . Attention deficit disorder    Meds in past--stopped with retirement  . Cancer ([place]) 08/2016   bladder  . Chronic kidney disease    kidney stones  . GERD (gastroesophageal reflux disease)   . History of colonic polyps    Benjamin Cortez  . History of kidney stones 1970's   stone basket extraction  . Hypertension   . Ocular migraine   . Plantar fasciitis 2018    Past Surgical History:  Procedure Laterality Date  . APPENDECTOMY  1959  . CHOLECYSTECTOMY  2005  . COLONOSCOPY    . CYSTOSCOPY WITH RETROGRADE PYELOGRAM, URETEROSCOPY AND STENT PLACEMENT Right 09/07/2016   Procedure: CYSTOSCOPY WITH RIGHT RETROGRADE PYELOGRAM;  Surgeon: Alexis Frock, MD;  Location: [facility];  Service: Urology;  Laterality: Right;  . KIDNEY STONE SURGERY  1970's   Basket extraction  . POLYPECTOMY    . TONSILLECTOMY  1959  . TRANSURETHRAL RESECTION OF BLADDER TUMOR N/A 09/07/2016   Procedure: TRANSURETHRAL RESECTION OF BLADDER TUMOR (TURBT);  Surgeon: Tresa Moore,  Hubbard Robinson, MD;  Location[facility];  Service: Urology;  Laterality: N/A;    Family History  Problem Relation Age of Onset  . Colon cancer Neg Hx   . Stomach cancer Neg Hx   . Esophageal cancer Neg Hx   . Rectal cancer Neg Hx   . Prostate cancer Neg Hx   . Pancreatic cancer Neg Hx     Social History   Socioeconomic History  . Marital status: Married    Spouse name: Not on file  . Number of children: 2  . Years of education: Not on file  . Highest education level: Not on file  Occupational History  . 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 Financial resource strain: Not on file  . Food insecurity:    Worry: Not on file    Inability: Not on file  . Transportation needs:     Medical: Not on file    Non-medical: Not on file  Tobacco Use  . Smoking status: Former Smoker    Packs/day: 1.00    Years: 16.00    Pack years: 16.00    Last attempt to quit: 02/07/1977    Years since quitting: 41.0  . Smokeless tobacco: Never Used  Substance and Sexual Activity  . Alcohol use: Yes    Comment: rerely  . Drug use: No  . Sexual activity: Yes  Lifestyle  . Physical activity:    Days per week: Not on file    Minutes per session: Not on file  . Stress: Not on file  Relationships  . Social connections:    Talks on phone: Not on file    Gets together: Not on file    Attends religious service: Not on file    Active member of club or organization: Not on file    Attends meetings of clubs or organizations: Not on file    Relationship status: Not on file  . Intimate partner violence:    Fear of current or ex partner: Not on file    Emotionally abused: Not on file    Physically abused: Not on file    Forced sexual activity: Not on file  Other Topics Concern  . Not on file  Social History Narrative   No living will   Would want wife to make health care decisions-- son Alverda Skeans is alternate   Would accept resuscitation attempts   Not sure about tube feeds   The PMH, PSH, Social History, Family History, Medications, and allergies have been reviewed in [facility], and have been updated if relevant. 1  Review of Systems  Constitutional: Negative.  Negative for fever.  HENT: Positive for congestion and voice change. Negative for sinus pressure, sinus pain, sneezing, sore throat, tinnitus and trouble swallowing.   Eyes: Negative.   Respiratory: Positive for cough, shortness of breath and wheezing. Negative for apnea, choking and chest tightness.   Cardiovascular: Negative.   Gastrointestinal: Negative.   Endocrine: Negative.   Genitourinary: Negative.   Musculoskeletal: Negative.   Allergic/Immunologic: Positive for environmental allergies.  Neurological: Negative.    Hematological: Negative.   Psychiatric/Behavioral: Negative.   All other systems reviewed and are negative.      Objective:    BP 118/70 (BP Location: Left Arm, Patient Position: Sitting, Cuff Size: Normal)   Pulse 85   Temp 98.5 F (36.9 C) (Oral)   Ht 5\' 7"  (1.702 m)   Wt 215 lb (97.5 kg)  SpO2 96%   BMI 33.67 kg/m    Physical Exam Vitals signs and nursing note reviewed.  Constitutional:      General: He is not in acute distress.    Appearance: Normal appearance. He is not ill-appearing, toxic-appearing or diaphoretic.  HENT:     Head: Normocephalic.     Right Ear: Tympanic membrane normal.     Left Ear: Tympanic membrane normal.     Nose: Congestion present.     Mouth/Throat:     Mouth: Mucous membranes are moist.  Eyes:     Extraocular Movements: Extraocular movements intact.  Neck:     Musculoskeletal: Normal range of motion.  Cardiovascular:     Rate and Rhythm: Normal rate and regular rhythm.     Pulses: Normal pulses.     Heart sounds: Normal heart sounds.  Pulmonary:     Effort: No respiratory distress.     Breath sounds: No stridor. Examination of the right-upper field reveals wheezing. Examination of the left-upper field reveals wheezing. Examination of the right-middle field reveals wheezing. Examination of the left-middle field reveals wheezing. Examination of the left-lower field reveals rhonchi and rales. Wheezing, rhonchi and rales present.     Comments: Harsh, wet cough on exam Chest:     Chest wall: No tenderness.  Musculoskeletal: Normal range of motion.        General: No swelling.  Skin:    General: Skin is warm and dry.  Neurological:     General: No focal deficit present.     Mental Status: He is alert and oriented to person, place, and time.  Psychiatric:        Mood and Affect: Mood normal.        Behavior: Behavior normal.        Thought Content: Thought content normal.        Judgment: Judgment normal.           Assessment  & Plan:   Cough - Plan: ipratropium-albuterol (DUONEB) 0.5-2.5 (3) MG/3ML nebulizer solution 3 mL No follow-ups on file.

## 2018-02-13 NOTE — Progress Notes (Signed)
i

## 2018-02-13 NOTE — Patient Instructions (Addendum)
Great to see you.  Please take Levaquin as directed- 1 tablet daily for 7 day. Prednisone taper as directed.  Promethazine DM as needed for cough- this can make you sleepy.

## 2018-02-14 ENCOUNTER — Ambulatory Visit: Payer: Self-pay

## 2018-02-14 NOTE — Telephone Encounter (Signed)
Pt stated that he was advised by [facility] that the promethazine-dextromethorphan (PROMETHAZINE-DM) 6.25-15 MG/5ML syrup is on backorder and a new prescription for another medication would need to be sent. Pt was seen by Dr Deborra Medina yesterday. [facility]  Reason for Disposition . Pharmacy calling with prescription questions and triager unable to answer question  Answer Assessment - Initial Assessment Questions 1. SYMPTOMS: "Do you have any symptoms?"     n/a 2. SEVERITY: If symptoms are present, ask "Are they mild, moderate or severe?"     n/a  Protocols used: MEDICATION QUESTION CALL-A-AH

## 2018-02-14 NOTE — Telephone Encounter (Signed)
Dr Silvio Pate not in office this afternoon; pt saw Dr Deborra Medina on 02/13/18. Sent note to [place].

## 2018-02-15 DIAGNOSIS — L918 Other hypertrophic disorders of the skin: Secondary | ICD-10-CM | POA: Diagnosis not present

## 2018-02-15 DIAGNOSIS — L308 Other specified dermatitis: Secondary | ICD-10-CM | POA: Diagnosis not present

## 2018-02-15 DIAGNOSIS — H524 Presbyopia: Secondary | ICD-10-CM | POA: Diagnosis not present

## 2018-02-15 DIAGNOSIS — H25813 Combined forms of age-related cataract, bilateral: Secondary | ICD-10-CM | POA: Diagnosis not present

## 2018-02-15 DIAGNOSIS — L309 Dermatitis, unspecified: Secondary | ICD-10-CM | POA: Diagnosis not present

## 2018-02-16 ENCOUNTER — Telehealth: Payer: Self-pay | Admitting: Internal Medicine

## 2018-02-16 MED ORDER — BENZONATATE 200 MG PO CAPS: 200.0000 mg | ORAL_CAPSULE | Freq: Three times a day (TID) | ORAL | 0 refills | Status: DC | PRN

## 2018-02-16 NOTE — Telephone Encounter (Signed)
Amanda/[facility] called office stating they received a prescription for the promethazine, but the medication is on back order at their pharmacies. They are requesting an alternative to to be sent in.

## 2018-02-16 NOTE — Telephone Encounter (Signed)
Spoke to pt. He will try the tessalon perles.

## 2018-02-16 NOTE — Telephone Encounter (Signed)
Let him know I sent a prescription for cough tabs that work well. If it doesn't help, I can sent a narcotic cough syrup (I am on this weekend)

## 2018-02-22 NOTE — Telephone Encounter (Signed)
See other phone note

## 2018-05-25 ENCOUNTER — Encounter: Payer: PPO | Admitting: Internal Medicine

## 2018-06-04 MED ORDER — OMEPRAZOLE 20 MG PO CPDR: 20.0000 mg | DELAYED_RELEASE_CAPSULE | Freq: Every day | ORAL | 3 refills | Status: DC

## 2018-08-14 DIAGNOSIS — C670 Malignant neoplasm of trigone of bladder: Secondary | ICD-10-CM | POA: Diagnosis not present

## 2018-08-14 DIAGNOSIS — R3915 Urgency of urination: Secondary | ICD-10-CM | POA: Diagnosis not present

## 2018-08-14 DIAGNOSIS — N202 Calculus of kidney with calculus of ureter: Secondary | ICD-10-CM | POA: Diagnosis not present

## 2018-11-22 ENCOUNTER — Other Ambulatory Visit: Payer: Self-pay | Admitting: Internal Medicine

## 2018-11-22 DIAGNOSIS — H25813 Combined forms of age-related cataract, bilateral: Secondary | ICD-10-CM | POA: Diagnosis not present

## 2018-11-22 DIAGNOSIS — H524 Presbyopia: Secondary | ICD-10-CM | POA: Diagnosis not present

## 2018-12-28 ENCOUNTER — Other Ambulatory Visit: Payer: Self-pay

## 2019-03-05 ENCOUNTER — Other Ambulatory Visit: Payer: Self-pay

## 2019-03-05 MED ORDER — LOSARTAN POTASSIUM-HCTZ 50-12.5 MG PO TABS: 0.5000 | ORAL_TABLET | Freq: Every day | ORAL | 0 refills | Status: DC

## 2019-03-05 NOTE — Addendum Note (Signed)
Addended by: Pilar Grammes on: 03/05/2019 01:32 PM   Modules accepted: Orders

## 2019-03-05 NOTE — Telephone Encounter (Signed)
Okay to refill #30 x 0 He needs an appointment before these run out or we can't refill it again per office policy

## 2019-03-05 NOTE — Telephone Encounter (Signed)
Benjamin Cortez, can you help get him scheduled for at least a med f/u in the next 30 days. Thanks

## 2019-03-05 NOTE — Telephone Encounter (Signed)
Pharmacy requests refill on:   LAST REFILL: 11/22/18 LAST OV:  05/19/17 NEXT OV: none [facility]   Refill request denied. Last fill sent Oct 2020 with note that pt needed apt for further refills. No apts have been made.

## 2019-03-05 NOTE — Telephone Encounter (Signed)
Pt left v/m that pt is out of BP med and request med refill and cb.

## 2019-03-19 ENCOUNTER — Encounter: Payer: Self-pay | Admitting: Internal Medicine

## 2019-03-19 ENCOUNTER — Other Ambulatory Visit: Payer: Self-pay

## 2019-03-19 ENCOUNTER — Ambulatory Visit (INDEPENDENT_AMBULATORY_CARE_PROVIDER_SITE_OTHER): Payer: PPO | Admitting: Internal Medicine

## 2019-03-19 VITALS — BP 126/72 | HR 64 | Temp 96.7°F | Ht 67.0 in | Wt 224.0 lb

## 2019-03-19 DIAGNOSIS — K219 Gastro-esophageal reflux disease without esophagitis: Secondary | ICD-10-CM

## 2019-03-19 DIAGNOSIS — J301 Allergic rhinitis due to pollen: Secondary | ICD-10-CM

## 2019-03-19 DIAGNOSIS — I10 Essential (primary) hypertension: Secondary | ICD-10-CM | POA: Diagnosis not present

## 2019-03-19 DIAGNOSIS — N400 Enlarged prostate without lower urinary tract symptoms: Secondary | ICD-10-CM | POA: Diagnosis not present

## 2019-03-19 LAB — HEPATIC FUNCTION PANEL
ALT: 25 U/L (ref 0–53)
AST: 20 U/L (ref 0–37)
Albumin: 4 g/dL (ref 3.5–5.2)
Alkaline Phosphatase: 99 U/L (ref 39–117)
Bilirubin, Direct: 0.1 mg/dL (ref 0.0–0.3)
Total Bilirubin: 0.7 mg/dL (ref 0.2–1.2)
Total Protein: 6.7 g/dL (ref 6.0–8.3)

## 2019-03-19 LAB — RENAL FUNCTION PANEL
Albumin: 4 g/dL (ref 3.5–5.2)
BUN: 16 mg/dL (ref 6–23)
CO2: 30 mEq/L (ref 19–32)
Calcium: 9.2 mg/dL (ref 8.4–10.5)
Chloride: 105 mEq/L (ref 96–112)
Creatinine, Ser: 1.12 mg/dL (ref 0.40–1.50)
GFR: 64.61 mL/min (ref >60.00)
Glucose, Bld: 79 mg/dL (ref 70–99)
Phosphorus: 2.5 mg/dL (ref 2.3–4.6)
Potassium: 3.8 mEq/L (ref 3.5–5.1)
Sodium: 139 mEq/L (ref 135–145)

## 2019-03-19 LAB — CBC
HCT: 44.6 % (ref 39.0–52.0)
Hemoglobin: 15.1 g/dL (ref 13.0–17.0)
MCHC: 33.9 g/dL (ref 30.0–36.0)
MCV: 93.8 fl (ref 78.0–100.0)
Platelets: 240 10*3/uL (ref 150.0–400.0)
RBC: 4.75 Mil/uL (ref 4.22–5.81)
RDW: 12.8 % (ref 11.5–15.5)
WBC: 5.9 10*3/uL (ref 4.0–10.5)

## 2019-03-19 MED ORDER — OMEPRAZOLE 20 MG PO CPDR: 20.0000 mg | DELAYED_RELEASE_CAPSULE | Freq: Every day | ORAL | 3 refills | Status: DC

## 2019-03-19 NOTE — Assessment & Plan Note (Signed)
BP Readings from Last 3 Encounters:  03/19/19 126/72  02/13/18 118/70  05/19/17 136/80   Reasonable control on the ARB/HCTZ Due for labs

## 2019-03-19 NOTE — Assessment & Plan Note (Signed)
Symptoms in the past but doing well now

## 2019-03-19 NOTE — Progress Notes (Signed)
Subjective:    Patient ID: Benjamin Cortez, male    DOB: 08-Jul-1945, 74 y.o.   MRN: [id number]  HPI Here for follow up of HTN and other chronic health conditions Hasn't been seen in a while This visit occurred during the SARS-CoV-2 public health emergency.  Safety protocols were in place, including screening questions prior to the visit, additional usage of staff PPE, and extensive cleaning of exam room while observing appropriate contact time as indicated for disinfecting solutions.   Has continued on his blood pressure medications Not going to the gym due to COVID--may resume after second COVID vaccine No chest pain No SOB  Has some hip stiffness and pain Better with stretching exercise Will notice pain after walking 2 miles or more  Continues on omeprazole No heartburn---or very rare (like if eats really late) No dysphagia  Does okay with OTC allergy meds No asthma symptoms  Voids okay No nocturia Stream is good  Current Outpatient Medications on File Prior to Visit  Medication Sig Dispense Refill  . aspirin 325 MG tablet Take 325 mg by mouth daily.      . cetirizine (ZYRTEC) 10 MG tablet Take 10 mg by mouth daily. Rotates weekly claritin and zyrtec    . Glucosamine-Chondroitin (GLUCOSAMINE CHONDR COMPLEX PO) Take by mouth.    Marland Kitchen guaiFENesin (MUCINEX) 600 MG 12 hr tablet Take 1,200 mg by mouth 2 (two) times daily as needed.     . loratadine (CLARITIN) 10 MG tablet Take 10 mg by mouth daily.    Marland Kitchen losartan-hydrochlorothiazide (HYZAAR) 50-12.5 MG tablet Take 0.5 tablets by mouth daily. MUST SCHEDULE OFFICE VISIT 15 tablet 0  . Multiple Vitamin (MULTIVITAMIN) tablet Take 1 tablet by mouth daily.    Marland Kitchen omeprazole (PRILOSEC) 20 MG capsule Take 1 capsule (20 mg total) by mouth daily. 90 capsule 3  . triamcinolone cream (KENALOG) 0.1 % Apply 1 application topically daily.     No current facility-administered medications on file prior to visit.    Allergies  Allergen Reactions  .  Tetracycline     Rash, blisters  . Penicillins     As a child--rash  . Codeine Nausea Only    Very disoriented    Past Medical History:  Diagnosis Date  . Allergic rhinitis   . Allergy   . Asthmatic bronchitis 1997   controlled with meds-change in work mold exposure  . Attention deficit disorder    Meds in past--stopped with retirement  . Cancer ([place]) 08/2016   bladder  . Chronic kidney disease    kidney stones  . GERD (gastroesophageal reflux disease)   . History of colonic polyps    [name]  . History of kidney stones 1970's   stone basket extraction  . Hypertension   . Ocular migraine   . Plantar fasciitis 2018    Past Surgical History:  Procedure Laterality Date  . APPENDECTOMY  1959  . CHOLECYSTECTOMY  2005  . COLONOSCOPY    . CYSTOSCOPY WITH RETROGRADE PYELOGRAM, URETEROSCOPY AND STENT PLACEMENT Right 09/07/2016   Procedure: CYSTOSCOPY WITH RIGHT RETROGRADE PYELOGRAM;  Surgeon: Alexis Frock, MD;  Location: [facility];  Service: Urology;  Laterality: Right;  . KIDNEY STONE SURGERY  1970's   Basket extraction  . POLYPECTOMY    . TONSILLECTOMY  1959  . TRANSURETHRAL RESECTION OF BLADDER TUMOR N/A 09/07/2016   Procedure: TRANSURETHRAL RESECTION OF BLADDER TUMOR (TURBT);  Surgeon: Alexis Frock, MD;  Location: [facility];  Service: Urology;  Laterality: N/A;    Family History  Problem Relation Age of Onset  . Colon cancer Neg Hx   . Stomach cancer Neg Hx   . Esophageal cancer Neg Hx   . Rectal cancer Neg Hx   . Prostate cancer Neg Hx   . Pancreatic cancer Neg Hx     Social History   Socioeconomic History  . Marital status: Married    Spouse name: Not on file  . Number of children: 2  . Years of education: Not on file  . Highest education level: Not on file  Occupational History  . 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 Smoking status: Former Smoker    Packs/day: 1.00    Years: 16.00     Pack years: 16.00    Quit date: 02/07/1977    Years since quitting: 42.1  . Smokeless tobacco: Never Used  Substance and Sexual Activity  . Alcohol use: Yes    Comment: rerely  . Drug use: No  . Sexual activity: Yes  Other Topics Concern  . Not on file  Social History Narrative   No living will   Would want wife to make health care decisions-- son Alverda Skeans is alternate   Would accept resuscitation attempts   Not sure about tube feeds   Social Determinants of Health   Financial Resource Strain:   . Difficulty of Paying Living Expenses: Not on file  Food Insecurity:   . Worried About Charity fundraiser in the Last Year: Not on file  . Ran Out of Food in the Last Year: Not on file  Transportation Needs:   . Lack of Transportation (Medical): Not on file  . Lack of Transportation (Non-Medical): Not on file  Physical Activity:   . Days of Exercise per Week: Not on file  . Minutes of Exercise per Session: Not on file  Stress:   . Feeling of Stress : Not on file  Social Connections:   . Frequency of Communication with Friends and Family: Not on file  . Frequency of Social Gatherings with Friends and Family: Not on file  . Attends Religious Services: Not on file  . Active Member of Clubs or Organizations: Not on file  . Attends Archivist Meetings: Not on file  . Marital Status: Not on file  Intimate Partner Violence:   . Fear of Current or Ex-Partner: Not on file  . Emotionally Abused: Not on file  . Physically Abused: Not on file  . Sexually Abused: Not on file   Review of Systems Appetite is good Weight is up some Sleeps okay    Objective:   Physical Exam  Constitutional: He appears well-developed. No distress.  Neck: No thyromegaly present.  Cardiovascular: Normal rate, regular rhythm, normal heart sounds and intact distal pulses. Exam reveals no gallop.  No murmur heard. Respiratory: Effort normal and breath sounds normal. No respiratory distress. He has  no wheezes. He has no rales.  GI: Soft. There is no abdominal tenderness.  Musculoskeletal:        General: No tenderness or edema.  Lymphadenopathy:    He has no cervical adenopathy.  Skin: No rash noted.  Psychiatric: He has a normal mood and affect. His behavior is normal.           Assessment & Plan:

## 2019-03-19 NOTE — Patient Instructions (Signed)
Reduce the aspirin to twice a week. You are due for your colonoscopy in October. Get back on your regular exercise schedule.

## 2019-03-19 NOTE — Assessment & Plan Note (Signed)
Does okay on the PPI

## 2019-03-19 NOTE — Assessment & Plan Note (Signed)
Okay with cetirizine

## 2019-05-07 DIAGNOSIS — M25552 Pain in left hip: Secondary | ICD-10-CM | POA: Diagnosis not present

## 2019-05-07 DIAGNOSIS — M545 Low back pain: Secondary | ICD-10-CM | POA: Diagnosis not present

## 2019-05-07 DIAGNOSIS — M5136 Other intervertebral disc degeneration, lumbar region: Secondary | ICD-10-CM | POA: Diagnosis not present

## 2019-05-18 DIAGNOSIS — M5136 Other intervertebral disc degeneration, lumbar region: Secondary | ICD-10-CM | POA: Diagnosis not present

## 2019-05-22 ENCOUNTER — Other Ambulatory Visit: Payer: Self-pay

## 2019-05-22 MED ORDER — LOSARTAN POTASSIUM-HCTZ 50-12.5 MG PO TABS: 0.5000 | ORAL_TABLET | Freq: Every day | ORAL | 3 refills | Status: DC

## 2019-05-24 DIAGNOSIS — M48062 Spinal stenosis, lumbar region with neurogenic claudication: Secondary | ICD-10-CM | POA: Diagnosis not present

## 2019-05-24 DIAGNOSIS — G609 Hereditary and idiopathic neuropathy, unspecified: Secondary | ICD-10-CM | POA: Diagnosis not present

## 2019-05-24 DIAGNOSIS — M545 Low back pain: Secondary | ICD-10-CM | POA: Diagnosis not present

## 2019-06-18 DIAGNOSIS — M5136 Other intervertebral disc degeneration, lumbar region: Secondary | ICD-10-CM | POA: Diagnosis not present

## 2019-06-26 ENCOUNTER — Other Ambulatory Visit: Payer: Self-pay

## 2019-06-26 ENCOUNTER — Telehealth: Payer: Self-pay | Admitting: Neurology

## 2019-06-26 ENCOUNTER — Encounter: Payer: Self-pay | Admitting: Neurology

## 2019-06-26 ENCOUNTER — Ambulatory Visit: Payer: PPO | Admitting: Neurology

## 2019-06-26 VITALS — BP 114/72 | HR 60 | Ht 67.0 in | Wt 223.0 lb

## 2019-06-26 DIAGNOSIS — R202 Paresthesia of skin: Secondary | ICD-10-CM | POA: Diagnosis not present

## 2019-06-26 DIAGNOSIS — M5416 Radiculopathy, lumbar region: Secondary | ICD-10-CM

## 2019-06-26 DIAGNOSIS — M542 Cervicalgia: Secondary | ICD-10-CM | POA: Diagnosis not present

## 2019-06-26 DIAGNOSIS — G629 Polyneuropathy, unspecified: Secondary | ICD-10-CM | POA: Insufficient documentation

## 2019-06-26 DIAGNOSIS — R292 Abnormal reflex: Secondary | ICD-10-CM

## 2019-06-26 MED ORDER — GABAPENTIN 100 MG PO CAPS: 100.0000 mg | ORAL_CAPSULE | Freq: Three times a day (TID) | ORAL | 11 refills | Status: DC

## 2019-06-26 NOTE — Telephone Encounter (Signed)
Health team order sent to GI. No auth they will reach out to the patient to schedule.  

## 2019-06-26 NOTE — Progress Notes (Signed)
PATIENT: Benjamin Cortez DOB: [birth year]  Chief Complaint  Patient presents with  . New Patient (Initial Visit)    RM EMG 4, alone. Paper referral from Benjamin Day, MD for low back pain. Has had this for a few years but has become debilitating. He also complains of pain/numbness in his feet.   Marland Kitchen PCP    Benjamin Cortez     HISTORICAL  Benjamin Cortez is a 74 year old male, seen in request by his primary care physician Benjamin Cortez for evaluation of chronic low back pain, complains of numbness tingling in his feet, initial evaluation was on Jun 26, 2019.  I have reviewed and summarized the referring note from the referring physician.  He has past medical history of hypertension, bladder cancer in 2018,  He has a long history of chronic low back pain, was under the care of orthopedic surgeon Dr.Bean,  was diagnosed with lumbar spondylosis, had abnormal x-ray, he complains of  low back pain radiating to bilateral lower extremity, left worse than right, sometimes feels like electricity shooting down lower extremities, also had a gradual onset bilateral feet numbness tingling, ascending to ankle levels, complains of plantar surface sensitivity, could not walk barefoot,  Recently he also noticed intermittent neck pain, right fourth and fifth finger paresthesia, he denies bowel and bladder incontinence,  His difficulties gradually getting worse, to the point of affecting his daily activity, he often feels significant low back pain after yard work, has to go home and lie down, difficulty bending over to pick up the yard stick on the ground,  REVIEW OF SYSTEMS: Full 14 system review of systems performed and notable only for as above All other review of systems were negative.  ALLERGIES: Allergies  Allergen Reactions  . Tetracycline     Rash, blisters  . Penicillins     As a child--rash  . Codeine Nausea Only    Very disoriented    HOME MEDICATIONS: Current Outpatient Medications   Medication Sig Dispense Refill  . aspirin 325 MG tablet Take 325 mg by mouth 2 (two) times a week.      . cetirizine (ZYRTEC) 10 MG tablet Take 10 mg by mouth daily. Rotates weekly claritin and zyrtec    . guaiFENesin (MUCINEX) 600 MG 12 hr tablet Take 1,200 mg by mouth 2 (two) times daily as needed.     . loratadine (CLARITIN) 10 MG tablet Take 10 mg by mouth daily.    Marland Kitchen losartan-hydrochlorothiazide (HYZAAR) 50-12.5 MG tablet Take 0.5 tablets by mouth daily. MUST SCHEDULE OFFICE VISIT 45 tablet 3  . Multiple Vitamin (MULTIVITAMIN) tablet Take 1 tablet by mouth daily.    Marland Kitchen omeprazole (PRILOSEC) 20 MG capsule Take 1 capsule (20 mg total) by mouth daily. 90 capsule 3  . triamcinolone cream (KENALOG) 0.1 % Apply 1 application topically daily.     No current facility-administered medications for this visit.    PAST MEDICAL HISTORY: Past Medical History:  Diagnosis Date  . Allergic rhinitis   . Allergy   . Asthmatic bronchitis 1997   controlled with meds-change in work mold exposure  . Attention deficit disorder    Meds in past--stopped with retirement  . Cancer ([place]) 08/2016   bladder  . Chronic kidney disease    kidney stones  . GERD (gastroesophageal reflux disease)   . History of colonic polyps    Benjamin Cortez  . History of kidney stones 1970's   stone basket extraction  . Hypertension   . Ocular migraine   .  Plantar fasciitis 2018    PAST SURGICAL HISTORY: Past Surgical History:  Procedure Laterality Date  . APPENDECTOMY  1959  . CHOLECYSTECTOMY  2005  . COLONOSCOPY    . CYSTOSCOPY WITH RETROGRADE PYELOGRAM, URETEROSCOPY AND STENT PLACEMENT Right 09/07/2016   Procedure: CYSTOSCOPY WITH RIGHT RETROGRADE PYELOGRAM;  Surgeon: Alexis Frock, MD;  Location: [facility];  Service: Urology;  Laterality: Right;  . KIDNEY STONE SURGERY  1970's   Basket extraction  . POLYPECTOMY    . TONSILLECTOMY  1959  . TRANSURETHRAL RESECTION OF BLADDER TUMOR N/A 09/07/2016    Procedure: TRANSURETHRAL RESECTION OF BLADDER TUMOR (TURBT);  Surgeon: Alexis Frock, MD;  Location[facility];  Service: Urology;  Laterality: N/A;    FAMILY HISTORY: Family History  Problem Relation Age of Onset  . Thyroid disease Mother   . Dementia Mother   . Leukemia Father   . Colon cancer Neg Hx   . Stomach cancer Neg Hx   . Esophageal cancer Neg Hx   . Rectal cancer Neg Hx   . Prostate cancer Neg Hx   . Pancreatic cancer Neg Hx     SOCIAL HISTORY: Social History   Socioeconomic History  . Marital status: Married    Spouse name: Benjamin Cortez  . Number of children: 2  . Years of education: 12+  . Highest education level: Not on file  Occupational History  . Occupation: Scientist, clinical (histocompatibility and immunogenetics)- Retired    Comment: retired 2014  Tobacco Use  . Smoking status: Former Smoker    Packs/Cortez: 1.00    Years: 16.00    Pack years: 16.00    Quit date: 02/07/1977    Years since quitting: 42.4  . Smokeless tobacco: Never Used  Substance and Sexual Activity  . Alcohol use: Yes    Comment: rerely  . Drug use: No  . Sexual activity: Yes  Other Topics Concern  . Not on file  Social History Narrative   Lives with wife   Caffeine use:tea/coffee daily   Right handed   No living will   Would want wife to make health care decisions-- son Alverda Skeans is alternate   Would accept resuscitation attempts   Not sure about tube feeds   Social Determinants of Health   Financial Resource Strain:   . Difficulty of Paying Living Expenses:   Food Insecurity:   . Worried About Charity fundraiser in the Last Year:   . Arboriculturist in the Last Year:   Transportation Needs:   . Film/video editor (Medical):   Marland Kitchen Lack of Transportation (Non-Medical):   Physical Activity:   . Days of Exercise per Week:   . Minutes of Exercise per Session:   Stress:   . Feeling of Stress :   Social Connections:   . Frequency of Communication with Friends and Family:   . Frequency of Social  Gatherings with Friends and Family:   . Attends Religious Services:   . Active Member of Clubs or Organizations:   . Attends Archivist Meetings:   Marland Kitchen Marital Status:   Intimate Partner Violence:   . Fear of Current or Ex-Partner:   . Emotionally Abused:   Marland Kitchen Physically Abused:   . Sexually Abused:      PHYSICAL EXAM   Vitals:   06/26/19 0712  BP: 114/72  Cortez: 60  Weight: 223 lb (101.2 kg)  Height: 5\' 7"  (1.702 m)    Not recorded  Body mass index is 34.93 kg/m.  PHYSICAL EXAMNIATION:  Gen: NAD, conversant, well nourised, well groomed                     Cardiovascular: Regular rate rhythm, no peripheral edema, warm, nontender. Eyes: Conjunctivae clear without exudates or hemorrhage Neck: Supple, no carotid bruits. Pulmonary: Clear to auscultation bilaterally   NEUROLOGICAL EXAM:  MENTAL STATUS: Speech:    Speech is normal; fluent and spontaneous with normal comprehension.  Cognition:     Orientation to time, place and person     Normal recent and remote memory     Normal Attention span and concentration     Normal Language, naming, repeating,spontaneous speech     Fund of knowledge   CRANIAL NERVES: CN II: Visual fields are full to confrontation. Pupils are round equal and briskly reactive to light. CN III, IV, VI: extraocular movement are normal. No ptosis. CN V: Facial sensation is intact to light touch CN VII: Face is symmetric with normal eye closure  CN VIII: Hearing is normal to causal conversation. CN IX, X: Phonation is normal. CN XI: Head turning and shoulder shrug are intact  MOTOR: He has mild toe flexion, extension weakness, rest of the muscle group testing was normal  REFLEXES: Reflexes are 2+ and symmetric at the biceps, triceps, 3/3 knees, and absent at ankles. Plantar responses are flexor.  SENSORY: Length dependent decreased light touch pinprick, vibratory sensation to left distal shin level, better preserved on the right  side  COORDINATION: There is no trunk or limb dysmetria noted.  GAIT/STANCE: He can get up from arms crossed, steady , difficulty perform heel walking, difficulty standing up on heels, Romberg signs was negative  DIAGNOSTIC DATA (LABS, IMAGING, TESTING) - I reviewed patient records, labs, notes, testing and imaging myself where available.   ASSESSMENT AND PLAN  Daejuan Drennon is a 74 y.o. male   Chronic lower extremity pain, radiating pain to bilateral lower extremity, left worse than right Bilateral lower extremity paresthesia, History of chronic neck pain, now with intermittent right fourth and fifth finger paresthesia,  On examination, he has length dependent sensory changes, left worse than right, mild to moderate bilateral toe flexion, extension weakness, hyperreflexia of bilateral upper extremity and patella, absent ankle reflex,  Differentiation diagnosis includes: Lumbar radiculopathy, peripheral neuropathy with superimposed cervical spondylitic myelopathy  Complete evaluation with MRI of cervical spine  EMG nerve conduction study  Laboratory evaluation for potential etiology of peripheral neuropathy  Gabapentin 100 mg 3 times a Cortez as needed for pain control  Brain MRI lumbar CD to review at next visit   Marcial Pacas, M.D. Ph.D.  [facility] [address] [place], [place] [address] Ph: [phone] Fax: [phone]  CC: Referring Provider

## 2019-06-27 ENCOUNTER — Other Ambulatory Visit: Payer: Self-pay

## 2019-06-27 ENCOUNTER — Other Ambulatory Visit (INDEPENDENT_AMBULATORY_CARE_PROVIDER_SITE_OTHER): Payer: Self-pay

## 2019-06-27 ENCOUNTER — Ambulatory Visit
Admission: RE | Admit: 2019-06-27 | Discharge: 2019-06-27 | Disposition: A | Discharge status: Home / Self Care | Payer: PPO | Source: Ambulatory Visit | Attending: Neurology | Admitting: Neurology

## 2019-06-27 DIAGNOSIS — R292 Abnormal reflex: Secondary | ICD-10-CM

## 2019-06-27 DIAGNOSIS — R202 Paresthesia of skin: Secondary | ICD-10-CM

## 2019-06-27 DIAGNOSIS — M5416 Radiculopathy, lumbar region: Secondary | ICD-10-CM

## 2019-06-27 DIAGNOSIS — Z0289 Encounter for other administrative examinations: Secondary | ICD-10-CM

## 2019-06-27 DIAGNOSIS — M542 Cervicalgia: Secondary | ICD-10-CM

## 2019-06-29 ENCOUNTER — Encounter: Payer: Self-pay | Admitting: Neurology

## 2019-07-01 ENCOUNTER — Telehealth: Payer: Self-pay | Admitting: Neurology

## 2019-07-01 NOTE — Telephone Encounter (Signed)
I have called patient, MRI of cervical spine showed large cental disc herniation causing severe spinal stenosis at C4-5,  He has pending appointment with Dr. Maxie Better  tomorrow on Jul 02, 2019  He also has EMG nerve conduction study pending with [place] in June, if Dr. Maxie Better decided to proceed with surgical decompression based on current information, he will call back to cancel the EMG nerve conduction study  But oftentimes, patient can have comorbidity, then he may continue study as scheduled IMPRESSION: This MRI of the brain without contrast shows the following: 1.   At C4-C5, there is a large central disc herniation causing severe spinal stenosis.  The spinal cord has normal signal.  There is mild foraminal narrowing but no nerve root compression. 2.   Degenerative changes are noted at other levels as above that do not need to spinal stenosis or nerve root compression. 3.   The spinal cord has normal signal. 4.   No acute findings.

## 2019-07-02 DIAGNOSIS — H43811 Vitreous degeneration, right eye: Secondary | ICD-10-CM | POA: Diagnosis not present

## 2019-07-02 DIAGNOSIS — M545 Low back pain: Secondary | ICD-10-CM | POA: Diagnosis not present

## 2019-07-02 LAB — MULTIPLE MYELOMA PANEL, SERUM
Albumin SerPl Elph-Mcnc: 3.8 g/dL (ref 2.9–4.4)
Albumin/Glob SerPl: 1.6 (ref 0.7–1.7)
Alpha 1: 0.2 g/dL (ref 0.0–0.4)
Alpha2 Glob SerPl Elph-Mcnc: 0.6 g/dL (ref 0.4–1.0)
B-Globulin SerPl Elph-Mcnc: 0.9 g/dL (ref 0.7–1.3)
Gamma Glob SerPl Elph-Mcnc: 0.7 g/dL (ref 0.4–1.8)
Globulin, Total: 2.4 g/dL (ref 2.2–3.9)
IgA/Immunoglobulin A, Serum: 142 mg/dL (ref 61–437)
IgG (Immunoglobin G), Serum: 797 mg/dL (ref 603–1613)
IgM (Immunoglobulin M), Srm: 28 mg/dL (ref 15–143)
Total Protein: 6.2 g/dL (ref 6.0–8.5)

## 2019-07-02 LAB — CK: Total CK: 87 U/L (ref 41–331)

## 2019-07-02 LAB — ANA W/REFLEX IF POSITIVE: Anti Nuclear Antibody (ANA): NEGATIVE

## 2019-07-02 LAB — HEPATITIS PANEL, ACUTE
Hep A IgM: NEGATIVE
Hep B C IgM: NEGATIVE
Hep C Virus Ab: <0.1 s/co ratio (ref 0.0–0.9)
Hepatitis B Surface Ag: NEGATIVE

## 2019-07-02 LAB — HGB A1C W/O EAG: Hgb A1c MFr Bld: 6 % — ABNORMAL HIGH (ref 4.8–5.6)

## 2019-07-02 LAB — SEDIMENTATION RATE: Sed Rate: 2 mm/hr (ref 0–30)

## 2019-07-02 LAB — RPR: RPR Ser Ql: NONREACTIVE

## 2019-07-02 LAB — VITAMIN B12: Vitamin B-12: 574 pg/mL (ref 232–1245)

## 2019-07-02 LAB — TSH: TSH: 1.96 u[IU]/mL (ref 0.450–4.500)

## 2019-07-02 LAB — C-REACTIVE PROTEIN: CRP: 2 mg/L (ref 0–10)

## 2019-07-02 LAB — VITAMIN D 25 HYDROXY (VIT D DEFICIENCY, FRACTURES): Vit D, 25-Hydroxy: 25.1 ng/mL — ABNORMAL LOW (ref 30.0–100.0)

## 2019-07-04 ENCOUNTER — Telehealth: Payer: Self-pay | Admitting: Neurology

## 2019-07-04 NOTE — Telephone Encounter (Signed)
we have his MRI lumbar from [facility] on May 18 2019

## 2019-07-17 ENCOUNTER — Encounter: Payer: PPO | Admitting: Neurology

## 2019-07-19 DIAGNOSIS — G959 Disease of spinal cord, unspecified: Secondary | ICD-10-CM | POA: Diagnosis not present

## 2019-07-25 ENCOUNTER — Other Ambulatory Visit: Payer: Self-pay

## 2019-07-25 ENCOUNTER — Encounter (INDEPENDENT_AMBULATORY_CARE_PROVIDER_SITE_OTHER): Payer: PPO | Admitting: Ophthalmology

## 2019-07-25 DIAGNOSIS — H43813 Vitreous degeneration, bilateral: Secondary | ICD-10-CM | POA: Diagnosis not present

## 2019-07-25 DIAGNOSIS — H2513 Age-related nuclear cataract, bilateral: Secondary | ICD-10-CM

## 2019-07-25 DIAGNOSIS — H33302 Unspecified retinal break, left eye: Secondary | ICD-10-CM

## 2019-07-26 DIAGNOSIS — M533 Sacrococcygeal disorders, not elsewhere classified: Secondary | ICD-10-CM | POA: Diagnosis not present

## 2019-07-26 DIAGNOSIS — G959 Disease of spinal cord, unspecified: Secondary | ICD-10-CM | POA: Diagnosis not present

## 2019-07-30 ENCOUNTER — Other Ambulatory Visit: Payer: Self-pay | Admitting: Orthopedic Surgery

## 2019-07-30 DIAGNOSIS — M533 Sacrococcygeal disorders, not elsewhere classified: Secondary | ICD-10-CM

## 2019-08-01 ENCOUNTER — Other Ambulatory Visit: Payer: Self-pay | Admitting: Orthopedic Surgery

## 2019-08-07 DIAGNOSIS — G959 Disease of spinal cord, unspecified: Secondary | ICD-10-CM | POA: Diagnosis not present

## 2019-08-07 NOTE — Pre-Procedure Instructions (Signed)
Your procedure is scheduled on Wednesday, July 7th from 1:38 PM- 3:40 PM.  Report to [facility] Main Entrance "A" at 10:35 A.M., and check in at the Admitting office.  Call this number if you have problems the morning of surgery:  [phone]  Call [phone] if you have any questions prior to your surgery date Monday-Friday 8am-4pm.    Remember:  Do not eat after midnight the night before your surgery.  You may drink clear liquids until 10:35 AM the morning of your surgery.    Clear liquids allowed are: Water, Non-Citrus Juices (without pulp), Carbonated Beverages, Clear Tea, Black Coffee Only, and Gatorade.    Enhanced Recovery after Surgery for Orthopedics Enhanced Recovery after Surgery is a protocol used to improve the stress on your body and your recovery after surgery.  Patient Instructions  . The night before surgery:  o No food after midnight. ONLY clear liquids after midnight.  .  . The day of surgery (if you do NOT have diabetes):  o Drink ONE (1) Pre-Surgery Clear Ensure by 10:35 AM.   o This drink was given to you during your hospital  pre-op appointment visit. o Nothing else to drink after completing the  Pre-Surgery Clear Ensure.         If you have questions, please contact your surgeon's office.     Take these medicines the morning of surgery with A SIP OF WATER: cetirizine (ZYRTEC) gabapentin (NEURONTIN) loratadine (CLARITIN)  omeprazole (PRILOSEC)   *Follow your surgeon's instructions on when to stop Aspirin.  If no instructions were given by your surgeon then you will need to call the office to get those instructions.     As of today, STOP taking any Aleve, Naproxen, Ibuprofen, Motrin, Advil, Goody's, BC's, all herbal medications, fish oil, and all vitamins.         The Morning of Surgery:             Do not wear jewelry.            Do not wear lotions, powders, colognes, or deodorant.            Men may shave face and neck.            Do  not bring valuables to the hospital.            [facility] is not responsible for any belongings or valuables.  Do NOT Smoke (Tobacco/Vapping) or drink Alcohol 24 hours prior to your procedure.  If you use a CPAP at night, you may bring all equipment for your overnight stay.   Contacts, glasses, dentures or bridgework may not be worn into surgery.      For patients admitted to the hospital, discharge time will be determined by your treatment team.   Patients discharged the day of surgery will not be allowed to drive home, and someone needs to stay with them for 24 hours.    Special instructions:   Benjamin Cortez- Preparing For Surgery  Before surgery, you can play an important role. Because skin is not sterile, your skin needs to be as free of germs as possible. You can reduce the number of germs on your skin by washing with CHG (chlorahexidine gluconate) Soap before surgery.  CHG is an antiseptic cleaner which kills germs and bonds with the skin to continue killing germs even after washing.    Oral Hygiene is also important to reduce your risk of infection.  Remember - BRUSH  YOUR TEETH THE MORNING OF SURGERY WITH YOUR REGULAR TOOTHPASTE  Please do not use if you have an allergy to CHG or antibacterial soaps. If your skin becomes reddened/irritated stop using the CHG.  Do not shave (including legs and underarms) for at least 48 hours prior to first CHG shower. It is OK to shave your face.  Please follow these instructions carefully.   1. Shower the NIGHT BEFORE SURGERY and the MORNING OF SURGERY with CHG Soap.   2. If you chose to wash your hair, wash your hair first as usual with your normal shampoo.  3. After you shampoo, rinse your hair and body thoroughly to remove the shampoo.  4. Use CHG as you would any other liquid soap. You can apply CHG directly to the skin and wash gently with a scrungie or a clean washcloth.   5. Apply the CHG Soap to your body ONLY FROM THE NECK DOWN.   Do not use on open wounds or open sores. Avoid contact with your eyes, ears, mouth and genitals (private parts). Wash Face and genitals (private parts)  with your normal soap.   6. Wash thoroughly, paying special attention to the area where your surgery will be performed.  7. Thoroughly rinse your body with warm water from the neck down.  8. DO NOT shower/wash with your normal soap after using and rinsing off the CHG Soap.  9. Pat yourself dry with a CLEAN TOWEL.  10. Wear CLEAN PAJAMAS to bed the night before surgery, wear comfortable clothes the morning of surgery  11. Place CLEAN SHEETS on your bed the night of your first shower and DO NOT SLEEP WITH PETS.   Day of Surgery: Shower with CHG Soap.  Do not apply any deodorants/lotions.  Please wear clean clothes to the hospital/surgery center.   Remember to brush your teeth WITH YOUR REGULAR TOOTHPASTE.   Please read over the following fact sheets that you were given.

## 2019-08-08 ENCOUNTER — Other Ambulatory Visit: Payer: Self-pay

## 2019-08-08 ENCOUNTER — Encounter (HOSPITAL_COMMUNITY): Payer: Self-pay

## 2019-08-08 ENCOUNTER — Encounter (HOSPITAL_COMMUNITY)
Admission: RE | Admit: 2019-08-08 | Discharge: 2019-08-08 | Disposition: A | Discharge status: Home / Self Care | Payer: PPO | Source: Ambulatory Visit | Attending: Orthopedic Surgery | Admitting: Orthopedic Surgery

## 2019-08-08 DIAGNOSIS — Z01818 Encounter for other preprocedural examination: Secondary | ICD-10-CM | POA: Insufficient documentation

## 2019-08-08 HISTORY — DX: Nausea with vomiting, unspecified: R11.2

## 2019-08-08 HISTORY — DX: Inflammatory liver disease, unspecified: K75.9

## 2019-08-08 HISTORY — DX: Pneumonia, unspecified organism: J18.9

## 2019-08-08 HISTORY — DX: Other specified postprocedural states: Z98.890

## 2019-08-08 LAB — URINALYSIS, ROUTINE W REFLEX MICROSCOPIC
Bilirubin Urine: NEGATIVE
Glucose, UA: NEGATIVE mg/dL
Hgb urine dipstick: NEGATIVE
Ketones, ur: NEGATIVE mg/dL
Leukocytes,Ua: NEGATIVE
Nitrite: NEGATIVE
Protein, ur: NEGATIVE mg/dL
Specific Gravity, Urine: 1.019 (ref 1.005–1.030)
pH: 5 (ref 5.0–8.0)

## 2019-08-08 LAB — CBC WITH DIFFERENTIAL/PLATELET
Abs Immature Granulocytes: 0.01 10*3/uL (ref 0.00–0.07)
Basophils Absolute: 0 10*3/uL (ref 0.0–0.1)
Basophils Relative: 1 %
Eosinophils Absolute: 0.2 10*3/uL (ref 0.0–0.5)
Eosinophils Relative: 5 %
HCT: 43.6 % (ref 39.0–52.0)
Hemoglobin: 14.9 g/dL (ref 13.0–17.0)
Immature Granulocytes: 0 %
Lymphocytes Relative: 25 %
Lymphs Abs: 1.1 10*3/uL (ref 0.7–4.0)
MCH: 31.6 pg (ref 26.0–34.0)
MCHC: 34.2 g/dL (ref 30.0–36.0)
MCV: 92.6 fL (ref 80.0–100.0)
Monocytes Absolute: 0.7 10*3/uL (ref 0.1–1.0)
Monocytes Relative: 14 %
Neutro Abs: 2.5 10*3/uL (ref 1.7–7.7)
Neutrophils Relative %: 55 %
Platelets: 208 10*3/uL (ref 150–400)
RBC: 4.71 MIL/uL (ref 4.22–5.81)
RDW: 12.2 % (ref 11.5–15.5)
WBC: 4.6 10*3/uL (ref 4.0–10.5)
nRBC: 0 % (ref 0.0–0.2)

## 2019-08-08 LAB — COMPREHENSIVE METABOLIC PANEL
ALT: 37 U/L (ref 0–44)
AST: 32 U/L (ref 15–41)
Albumin: 3.9 g/dL (ref 3.5–5.0)
Alkaline Phosphatase: 91 U/L (ref 38–126)
Anion gap: 11 (ref 5–15)
BUN: 15 mg/dL (ref 8–23)
CO2: 22 mmol/L (ref 22–32)
Calcium: 9.1 mg/dL (ref 8.9–10.3)
Chloride: 106 mmol/L (ref 98–111)
Creatinine, Ser: 1.16 mg/dL (ref 0.61–1.24)
GFR calc Af Amer: >60 mL/min (ref >60)
GFR calc non Af Amer: >60 mL/min (ref >60)
Glucose, Bld: 110 mg/dL — ABNORMAL HIGH (ref 70–99)
Potassium: 4.3 mmol/L (ref 3.5–5.1)
Sodium: 139 mmol/L (ref 135–145)
Total Bilirubin: 0.9 mg/dL (ref 0.3–1.2)
Total Protein: 6.6 g/dL (ref 6.5–8.1)

## 2019-08-08 LAB — SURGICAL PCR SCREEN
MRSA, PCR: NEGATIVE
Staphylococcus aureus: NEGATIVE

## 2019-08-08 LAB — PROTIME-INR
INR: 1 (ref 0.8–1.2)
Prothrombin Time: 12.9 seconds (ref 11.4–15.2)

## 2019-08-08 LAB — TYPE AND SCREEN
ABO/RH(D): O POS
Antibody Screen: NEGATIVE

## 2019-08-08 LAB — APTT: aPTT: 27 seconds (ref 24–36)

## 2019-08-08 NOTE — Progress Notes (Signed)
   08/08/19 0917  OBSTRUCTIVE SLEEP APNEA  Have you ever been diagnosed with sleep apnea through a sleep study? No  Do you snore loudly (loud enough to be heard through closed doors)?  1  Do you often feel tired, fatigued, or sleepy during the daytime (such as falling asleep during driving or talking to someone)? 0  Has anyone observed you stop breathing during your sleep? 0  Do you have, or are you being treated for high blood pressure? 1  BMI more than 35 kg/m2? 1  Age > 50 (1-yes) 1  Neck circumference greater than:Male 16 inches or larger, Male 17inches or larger? 0  Male Gender (Yes=1) 1  Obstructive Sleep Apnea Score 5

## 2019-08-08 NOTE — Progress Notes (Addendum)
PCP - Venia Carbon, MD Cardiologist - Denies  PPM/ICD - Denies  Chest x-ray - N/A EKG - 08/08/19 Stress Test - 09/04 ECHO - Denies Cardiac Cath - Denies  Sleep Study - Denies  Patient denies being a diabetic.  Blood Thinner Instructions: N/A Aspirin Instructions: Per patient, stop 5 days prior to sx. Last dose 08/09/19.  ERAS Protcol - Yes PRE-SURGERY Ensure or G2- Ensure given.  COVID TEST- 08/13/19   Anesthesia review: Yes, review stress test; abnormal EKG.  Patient denies shortness of breath, fever, cough and chest pain at PAT appointment   All instructions explained to the patient, with a verbal understanding of the material. Patient agrees to go over the instructions while at home for a better understanding. Patient also instructed to self quarantine after being tested for COVID-19. The opportunity to ask questions was provided.

## 2019-08-09 NOTE — Progress Notes (Signed)
Anesthesia Chart Review:  Case: [id number] Date/Time: 08/14/19 1323   Procedure: ANTERIOR CERVICAL DECOMPRESSION FUSION CERVICAL 4-5 WITH INSTRUMENTATION AND ALLOGRAFT (N/A )   Anesthesia type: General   Pre-op diagnosis: CERVICAL MYELOPATHY   Location: [facility] OR ROOM 05 / [place]   Surgeons: Phylliss Bob, MD      DISCUSSION: Patient is a 74 year old male scheduled for the above procedure.  History includes former smoker (quit 02/07/77), post-operative N/V, GERD, ADD, ocular migraines, HTN, hepatitis (age 51), bladder cancer (s/p TURBT 09/07/16), cholecystectomy (11/05/02), asthmatic bronchitis (1997, mold related). BMI is consistent with obesity. OSA screening score is 5.  Reported last aspirin 08/09/19.  He denied shortness of breath, cough, fever, chest pain at PAT RN visit.  Preoperative COVID-19 test is scheduled for 08/13/2019. Anesthesia team to evaluate on the day of surgery.    VS: BP 136/66   Pulse 65   Temp 36.5 C (Oral)   Resp 19   Ht 5\' 7"  (1.702 m)   Wt 101.8 kg   SpO2 98%   BMI 35.15 kg/m    PROVIDERS: Venia Carbon, MD his PCP Marcial Pacas, MD is neurologist Alexis Frock, MD is urologist   LABS: Labs reviewed: Acceptable for surgery. (all labs ordered are listed, but only abnormal results are displayed)  Labs Reviewed  COMPREHENSIVE METABOLIC PANEL - Abnormal; Notable for the following components:      Result Value   Glucose, Bld 110 (*)    All other components within normal limits  URINALYSIS, ROUTINE W REFLEX MICROSCOPIC - Abnormal; Notable for the following components:   APPearance HAZY (*)    All other components within normal limits  SURGICAL PCR SCREEN  APTT  CBC WITH DIFFERENTIAL/PLATELET  PROTIME-INR  TYPE AND SCREEN     IMAGES: MRI c-spine 06/27/19: IMPRESSION:  1.   At C4-C5, there is a large central disc herniation causing severe spinal stenosis.  The spinal cord has normal signal.  There is mild foraminal narrowing but no nerve root  compression. 2.   Degenerative changes are noted at other levels as above that do not need to spinal stenosis or nerve root compression. 3.   The spinal cord has normal signal. 4.   No acute findings.   EKG: 08/08/19 Ventricular rate 55 bpm Sinus bradycardia with sinus arrhythmia Otherwise normal ECG Since last tracing rate slower Confirmed by Cristopher Peru [id number]) on 08/08/2019 10:07:19 PM   CV: Reported a remote history of stress test in 2004. (I don't see any results, but records indicate admission for chest/abdominal pain in 09/2002 with negative cardiac work-up. Also noted that he was a participant in the [place] study of atherosclerosis at [facility] in 2001 and was found to have no coronary calcifications and normal carotid Dopplers. He went on to have cholecystectomy on 11/05/02.)   Past Medical History:  Diagnosis Date  . Allergic rhinitis   . Allergy   . Asthmatic bronchitis 1997   controlled with meds-change in work mold exposure  . Attention deficit disorder    Meds in past--stopped with retirement  . Cancer ([place]) 08/2016   bladder  . GERD (gastroesophageal reflux disease)   . Hepatitis    "i had hepatitis when i was 16"  . History of colonic polyps    [name]  . History of kidney stones 1970's   stone basket extraction  . Hypertension   . Ocular migraine   . Plantar fasciitis 2018  . Pneumonia   . PONV (postoperative nausea and vomiting)  Past Surgical History:  Procedure Laterality Date  . APPENDECTOMY  1959  . CHOLECYSTECTOMY  2005  . COLONOSCOPY    . CYSTOSCOPY WITH RETROGRADE PYELOGRAM, URETEROSCOPY AND STENT PLACEMENT Right 09/07/2016   Procedure: CYSTOSCOPY WITH RIGHT RETROGRADE PYELOGRAM;  Surgeon: Alexis Frock, MD;  [facility]: [facility];  Service: Urology;  Laterality: Right;  . KIDNEY STONE SURGERY  1970's   Basket extraction  . POLYPECTOMY    . TONSILLECTOMY  1959  . TRANSURETHRAL RESECTION OF BLADDER TUMOR N/A 09/07/2016   Procedure:  TRANSURETHRAL RESECTION OF BLADDER TUMOR (TURBT);  Surgeon: Alexis Frock, MD;  Location: [facility];  Service: Urology;  Laterality: N/A;    MEDICATIONS: . aspirin 325 MG tablet  . cetirizine (ZYRTEC) 10 MG tablet  . gabapentin (NEURONTIN) 100 MG capsule  . loratadine (CLARITIN) 10 MG tablet  . losartan-hydrochlorothiazide (HYZAAR) 50-12.5 MG tablet  . Multiple Vitamin (MULTIVITAMIN) tablet  . omeprazole (PRILOSEC) 20 MG capsule  . triamcinolone cream (KENALOG) 0.1 %   No current facility-administered medications for this encounter.    Myra Gianotti, PA-C Surgical Short Stay/Anesthesiology [facility] Phone [phone] [facility] Phone [phone] 08/09/2019 10:47 AM

## 2019-08-09 NOTE — Anesthesia Preprocedure Evaluation (Addendum)
Anesthesia Evaluation  Patient identified by MRN, date of birth, ID band Patient awake    Reviewed: Allergy & Precautions, H&P , NPO status , Patient's Chart, lab work & pertinent test results  History of Anesthesia Complications (+) PONV and history of anesthetic complications  Airway Mallampati: II   Neck ROM: full    Dental   Pulmonary asthma , former smoker,    breath sounds clear to auscultation       Cardiovascular hypertension,  Rhythm:regular Rate:Normal     Neuro/Psych  Headaches, ADD Neuromuscular disease    GI/Hepatic GERD  ,  Endo/Other  obese  Renal/GU      Musculoskeletal   Abdominal   Peds  Hematology   Anesthesia Other Findings   Reproductive/Obstetrics                             Anesthesia Physical Anesthesia Plan  ASA: II  Anesthesia Plan: General   Post-op Pain Management:    Induction: Intravenous  PONV Risk Score and Plan: 3 and Ondansetron, Dexamethasone, Midazolam and Treatment may vary due to age or medical condition  Airway Management Planned: Oral ETT  Additional Equipment:   Intra-op Plan:   Post-operative Plan: Extubation in OR  Informed Consent: I have reviewed the patients History and Physical, chart, labs and discussed the procedure including the risks, benefits and alternatives for the proposed anesthesia with the patient or authorized representative who has indicated his/her understanding and acceptance.       Plan Discussed with: CRNA, Anesthesiologist and Surgeon  Anesthesia Plan Comments: (PAT note written 08/09/2019 by Myra Gianotti, PA-C. )       Anesthesia Quick Evaluation

## 2019-08-13 ENCOUNTER — Other Ambulatory Visit: Payer: Self-pay

## 2019-08-13 ENCOUNTER — Ambulatory Visit
Admission: RE | Admit: 2019-08-13 | Discharge: 2019-08-13 | Disposition: A | Discharge status: Home / Self Care | Payer: PPO | Source: Ambulatory Visit | Attending: Orthopedic Surgery | Admitting: Orthopedic Surgery

## 2019-08-13 ENCOUNTER — Other Ambulatory Visit (HOSPITAL_COMMUNITY)
Admission: RE | Admit: 2019-08-13 | Discharge: 2019-08-13 | Disposition: A | Discharge status: Home / Self Care | Payer: PPO | Source: Ambulatory Visit | Attending: Orthopedic Surgery | Admitting: Orthopedic Surgery

## 2019-08-13 DIAGNOSIS — Z8551 Personal history of malignant neoplasm of bladder: Secondary | ICD-10-CM | POA: Diagnosis not present

## 2019-08-13 DIAGNOSIS — M533 Sacrococcygeal disorders, not elsewhere classified: Secondary | ICD-10-CM

## 2019-08-13 DIAGNOSIS — Z20822 Contact with and (suspected) exposure to covid-19: Secondary | ICD-10-CM | POA: Diagnosis not present

## 2019-08-13 DIAGNOSIS — G959 Disease of spinal cord, unspecified: Secondary | ICD-10-CM | POA: Diagnosis not present

## 2019-08-13 DIAGNOSIS — I10 Essential (primary) hypertension: Secondary | ICD-10-CM | POA: Diagnosis not present

## 2019-08-13 DIAGNOSIS — Z79899 Other long term (current) drug therapy: Secondary | ICD-10-CM | POA: Diagnosis not present

## 2019-08-13 DIAGNOSIS — M50122 Cervical disc disorder at C5-C6 level with radiculopathy: Secondary | ICD-10-CM | POA: Diagnosis not present

## 2019-08-13 DIAGNOSIS — M5000 Cervical disc disorder with myelopathy, unspecified cervical region: Secondary | ICD-10-CM | POA: Diagnosis not present

## 2019-08-13 LAB — SARS CORONAVIRUS 2 (TAT 6-24 HRS): SARS Coronavirus 2: NEGATIVE

## 2019-08-13 MED ORDER — VANCOMYCIN HCL 1500 MG/300ML IV SOLN
1500.0000 mg | INTRAVENOUS | Status: AC
  Administered 2019-08-14: 1500 mg via INTRAVENOUS
  Filled 2019-08-13: qty 300

## 2019-08-14 ENCOUNTER — Other Ambulatory Visit: Payer: Self-pay

## 2019-08-14 ENCOUNTER — Observation Stay (HOSPITAL_COMMUNITY)
Admission: RE | Admit: 2019-08-14 | Discharge: 2019-08-15 | Disposition: A | Discharge status: Home / Self Care | Payer: PPO | Attending: Orthopedic Surgery | Admitting: Orthopedic Surgery

## 2019-08-14 ENCOUNTER — Encounter (HOSPITAL_COMMUNITY): Payer: Self-pay | Admitting: Orthopedic Surgery

## 2019-08-14 ENCOUNTER — Ambulatory Visit (HOSPITAL_COMMUNITY): Payer: PPO | Admitting: Anesthesiology

## 2019-08-14 ENCOUNTER — Ambulatory Visit (HOSPITAL_COMMUNITY): Payer: PPO | Admitting: Vascular Surgery

## 2019-08-14 ENCOUNTER — Ambulatory Visit (HOSPITAL_COMMUNITY): Payer: PPO

## 2019-08-14 ENCOUNTER — Ambulatory Visit (HOSPITAL_COMMUNITY)
Admission: RE | Disposition: A | Discharge status: Home / Self Care | Payer: Self-pay | Source: Home / Self Care | Attending: Orthopedic Surgery

## 2019-08-14 DIAGNOSIS — M50221 Other cervical disc displacement at C4-C5 level: Secondary | ICD-10-CM | POA: Diagnosis not present

## 2019-08-14 DIAGNOSIS — I10 Essential (primary) hypertension: Secondary | ICD-10-CM | POA: Diagnosis not present

## 2019-08-14 DIAGNOSIS — M50021 Cervical disc disorder at C4-C5 level with myelopathy: Secondary | ICD-10-CM | POA: Diagnosis not present

## 2019-08-14 DIAGNOSIS — Z8551 Personal history of malignant neoplasm of bladder: Secondary | ICD-10-CM | POA: Diagnosis not present

## 2019-08-14 DIAGNOSIS — Z20822 Contact with and (suspected) exposure to covid-19: Secondary | ICD-10-CM | POA: Diagnosis not present

## 2019-08-14 DIAGNOSIS — M5000 Cervical disc disorder with myelopathy, unspecified cervical region: Principal | ICD-10-CM | POA: Insufficient documentation

## 2019-08-14 DIAGNOSIS — Z79899 Other long term (current) drug therapy: Secondary | ICD-10-CM | POA: Insufficient documentation

## 2019-08-14 DIAGNOSIS — Z419 Encounter for procedure for purposes other than remedying health state, unspecified: Secondary | ICD-10-CM

## 2019-08-14 DIAGNOSIS — J45909 Unspecified asthma, uncomplicated: Secondary | ICD-10-CM | POA: Diagnosis not present

## 2019-08-14 DIAGNOSIS — G9520 Unspecified cord compression: Secondary | ICD-10-CM | POA: Diagnosis not present

## 2019-08-14 DIAGNOSIS — M50122 Cervical disc disorder at C5-C6 level with radiculopathy: Secondary | ICD-10-CM | POA: Insufficient documentation

## 2019-08-14 DIAGNOSIS — G959 Disease of spinal cord, unspecified: Secondary | ICD-10-CM | POA: Diagnosis present

## 2019-08-14 DIAGNOSIS — M4322 Fusion of spine, cervical region: Secondary | ICD-10-CM | POA: Diagnosis not present

## 2019-08-14 HISTORY — PX: ANTERIOR CERVICAL DECOMP/DISCECTOMY FUSION: SHX1161

## 2019-08-14 LAB — ABO/RH: ABO/RH(D): O POS

## 2019-08-14 SURGERY — ANTERIOR CERVICAL DECOMPRESSION/DISCECTOMY FUSION 1 LEVEL
Anesthesia: General | Site: Spine Cervical

## 2019-08-14 MED ORDER — SODIUM CHLORIDE 0.9% FLUSH: 3.0000 mL | Freq: Two times a day (BID) | INTRAVENOUS | Status: DC

## 2019-08-14 MED ORDER — SUGAMMADEX SODIUM 200 MG/2ML IV SOLN
INTRAVENOUS | Status: DC | PRN
  Administered 2019-08-14: 400 mg via INTRAVENOUS

## 2019-08-14 MED ORDER — LIDOCAINE HCL (CARDIAC) PF 100 MG/5ML IV SOSY
PREFILLED_SYRINGE | INTRAVENOUS | Status: DC | PRN
  Administered 2019-08-14: 100 mg via INTRAVENOUS

## 2019-08-14 MED ORDER — ONDANSETRON HCL 4 MG PO TABS: 4.0000 mg | ORAL_TABLET | Freq: Three times a day (TID) | ORAL | Status: DC | PRN

## 2019-08-14 MED ORDER — PANTOPRAZOLE SODIUM 40 MG PO TBEC: 40.0000 mg | DELAYED_RELEASE_TABLET | Freq: Every day | ORAL | Status: DC

## 2019-08-14 MED ORDER — EPHEDRINE SULFATE 50 MG/ML IJ SOLN
INTRAMUSCULAR | Status: DC | PRN
  Administered 2019-08-14 (×3): 10 mg via INTRAVENOUS

## 2019-08-14 MED ORDER — METHOCARBAMOL 500 MG PO TABS
500.0000 mg | ORAL_TABLET | Freq: Four times a day (QID) | ORAL | Status: DC | PRN
  Administered 2019-08-14 – 2019-08-15 (×2): 500 mg via ORAL
  Filled 2019-08-14 (×2): qty 1

## 2019-08-14 MED ORDER — ONDANSETRON HCL 4 MG/2ML IJ SOLN: 4.0000 mg | Freq: Four times a day (QID) | INTRAMUSCULAR | Status: DC | PRN

## 2019-08-14 MED ORDER — CHLORHEXIDINE GLUCONATE 0.12 % MT SOLN
15.0000 mL | Freq: Once | OROMUCOSAL | Status: AC
  Administered 2019-08-14: 15 mL via OROMUCOSAL
  Filled 2019-08-14: qty 15

## 2019-08-14 MED ORDER — BUPIVACAINE-EPINEPHRINE 0.25% -1:200000 IJ SOLN
INTRAMUSCULAR | Status: DC | PRN
  Administered 2019-08-14: 6 mL

## 2019-08-14 MED ORDER — PHENOL 1.4 % MT LIQD: 1.0000 | OROMUCOSAL | Status: DC | PRN

## 2019-08-14 MED ORDER — SODIUM CHLORIDE 0.9 % IV SOLN: 250.0000 mL | INTRAVENOUS | Status: DC

## 2019-08-14 MED ORDER — HEMOSTATIC AGENTS (NO CHARGE) OPTIME
TOPICAL | Status: DC | PRN
  Administered 2019-08-14: 1 via TOPICAL

## 2019-08-14 MED ORDER — PANTOPRAZOLE SODIUM 40 MG IV SOLR: 40.0000 mg | Freq: Every day | INTRAVENOUS | Status: DC

## 2019-08-14 MED ORDER — LIDOCAINE 2% (20 MG/ML) 5 ML SYRINGE
INTRAMUSCULAR | Status: AC
  Filled 2019-08-14: qty 5

## 2019-08-14 MED ORDER — EPHEDRINE 5 MG/ML INJ
INTRAVENOUS | Status: AC
  Filled 2019-08-14: qty 10

## 2019-08-14 MED ORDER — MIDAZOLAM HCL 2 MG/2ML IJ SOLN
INTRAMUSCULAR | Status: AC
  Filled 2019-08-14: qty 2

## 2019-08-14 MED ORDER — PHENYLEPHRINE 40 MCG/ML (10ML) SYRINGE FOR IV PUSH (FOR BLOOD PRESSURE SUPPORT)
PREFILLED_SYRINGE | INTRAVENOUS | Status: AC
  Filled 2019-08-14: qty 20

## 2019-08-14 MED ORDER — POVIDONE-IODINE 7.5 % EX SOLN
Freq: Once | CUTANEOUS | Status: DC
  Filled 2019-08-14: qty 118

## 2019-08-14 MED ORDER — ACETAMINOPHEN 650 MG RE SUPP: 650.0000 mg | RECTAL | Status: DC | PRN

## 2019-08-14 MED ORDER — FENTANYL CITRATE (PF) 100 MCG/2ML IJ SOLN
INTRAMUSCULAR | Status: DC | PRN
  Administered 2019-08-14: 100 ug via INTRAVENOUS
  Administered 2019-08-14: 150 ug via INTRAVENOUS

## 2019-08-14 MED ORDER — DEXAMETHASONE SODIUM PHOSPHATE 10 MG/ML IJ SOLN
INTRAMUSCULAR | Status: DC | PRN
  Administered 2019-08-14: 5 mg via INTRAVENOUS

## 2019-08-14 MED ORDER — ARTIFICIAL TEARS OPHTHALMIC OINT
TOPICAL_OINTMENT | OPHTHALMIC | Status: AC
  Filled 2019-08-14: qty 3.5

## 2019-08-14 MED ORDER — LORATADINE 10 MG PO TABS: 10.0000 mg | ORAL_TABLET | Freq: Every day | ORAL | Status: DC

## 2019-08-14 MED ORDER — ZOLPIDEM TARTRATE 5 MG PO TABS: 5.0000 mg | ORAL_TABLET | Freq: Every evening | ORAL | Status: DC | PRN

## 2019-08-14 MED ORDER — SODIUM CHLORIDE 0.9% FLUSH: 3.0000 mL | INTRAVENOUS | Status: DC | PRN

## 2019-08-14 MED ORDER — ORAL CARE MOUTH RINSE: 15.0000 mL | Freq: Once | OROMUCOSAL | Status: AC

## 2019-08-14 MED ORDER — PROPOFOL 10 MG/ML IV BOLUS
INTRAVENOUS | Status: AC
  Filled 2019-08-14: qty 20

## 2019-08-14 MED ORDER — MENTHOL 3 MG MT LOZG
1.0000 | LOZENGE | OROMUCOSAL | Status: DC | PRN
  Filled 2019-08-14: qty 9

## 2019-08-14 MED ORDER — PROPOFOL 10 MG/ML IV BOLUS
INTRAVENOUS | Status: DC | PRN
  Administered 2019-08-14: 200 mg via INTRAVENOUS

## 2019-08-14 MED ORDER — HYDROCHLOROTHIAZIDE 12.5 MG PO CAPS: 12.5000 mg | ORAL_CAPSULE | Freq: Every day | ORAL | Status: DC

## 2019-08-14 MED ORDER — SENNOSIDES-DOCUSATE SODIUM 8.6-50 MG PO TABS: 1.0000 | ORAL_TABLET | Freq: Every evening | ORAL | Status: DC | PRN

## 2019-08-14 MED ORDER — THROMBIN 20000 UNITS EX SOLR
CUTANEOUS | Status: DC | PRN
  Administered 2019-08-14: 20000 [IU] via TOPICAL

## 2019-08-14 MED ORDER — ACETAMINOPHEN 325 MG PO TABS: 650.0000 mg | ORAL_TABLET | ORAL | Status: DC | PRN

## 2019-08-14 MED ORDER — DEXAMETHASONE SODIUM PHOSPHATE 10 MG/ML IJ SOLN
INTRAMUSCULAR | Status: AC
  Filled 2019-08-14: qty 2

## 2019-08-14 MED ORDER — METHOCARBAMOL 1000 MG/10ML IJ SOLN
500.0000 mg | Freq: Four times a day (QID) | INTRAVENOUS | Status: DC | PRN
  Filled 2019-08-14: qty 5

## 2019-08-14 MED ORDER — BUPIVACAINE-EPINEPHRINE (PF) 0.25% -1:200000 IJ SOLN
INTRAMUSCULAR | Status: AC
  Filled 2019-08-14: qty 30

## 2019-08-14 MED ORDER — PHENYLEPHRINE HCL (PRESSORS) 10 MG/ML IV SOLN
INTRAVENOUS | Status: DC | PRN
  Administered 2019-08-14: 80 ug via INTRAVENOUS
  Administered 2019-08-14: 40 ug via INTRAVENOUS

## 2019-08-14 MED ORDER — FENTANYL CITRATE (PF) 100 MCG/2ML IJ SOLN
25.0000 ug | INTRAMUSCULAR | Status: DC | PRN
  Administered 2019-08-14 (×2): 50 ug via INTRAVENOUS

## 2019-08-14 MED ORDER — VANCOMYCIN HCL IN DEXTROSE 1-5 GM/200ML-% IV SOLN
1000.0000 mg | Freq: Once | INTRAVENOUS | Status: AC
  Administered 2019-08-14: 1000 mg via INTRAVENOUS
  Filled 2019-08-14: qty 200

## 2019-08-14 MED ORDER — LOSARTAN POTASSIUM-HCTZ 50-12.5 MG PO TABS: 0.5000 | ORAL_TABLET | Freq: Every day | ORAL | Status: DC

## 2019-08-14 MED ORDER — ONDANSETRON HCL 4 MG PO TABS: 4.0000 mg | ORAL_TABLET | Freq: Four times a day (QID) | ORAL | Status: DC | PRN

## 2019-08-14 MED ORDER — LOSARTAN POTASSIUM 50 MG PO TABS: 50.0000 mg | ORAL_TABLET | Freq: Every day | ORAL | Status: DC

## 2019-08-14 MED ORDER — ONDANSETRON HCL 4 MG/2ML IJ SOLN
INTRAMUSCULAR | Status: DC | PRN
  Administered 2019-08-14: 4 mg via INTRAVENOUS

## 2019-08-14 MED ORDER — MIDAZOLAM HCL 5 MG/5ML IJ SOLN
INTRAMUSCULAR | Status: DC | PRN
  Administered 2019-08-14: 2 mg via INTRAVENOUS

## 2019-08-14 MED ORDER — OXYCODONE-ACETAMINOPHEN 5-325 MG PO TABS
1.0000 | ORAL_TABLET | ORAL | Status: DC | PRN
  Administered 2019-08-14: 1 via ORAL
  Administered 2019-08-14: 2 via ORAL
  Administered 2019-08-15: 1 via ORAL
  Administered 2019-08-15: 2 via ORAL
  Filled 2019-08-14 (×2): qty 1
  Filled 2019-08-14 (×2): qty 2

## 2019-08-14 MED ORDER — ROCURONIUM BROMIDE 100 MG/10ML IV SOLN
INTRAVENOUS | Status: DC | PRN
  Administered 2019-08-14: 80 mg via INTRAVENOUS

## 2019-08-14 MED ORDER — ONDANSETRON HCL 4 MG/2ML IJ SOLN
INTRAMUSCULAR | Status: AC
  Filled 2019-08-14: qty 2

## 2019-08-14 MED ORDER — FENTANYL CITRATE (PF) 250 MCG/5ML IJ SOLN
INTRAMUSCULAR | Status: AC
  Filled 2019-08-14: qty 5

## 2019-08-14 MED ORDER — FLEET ENEMA 7-19 GM/118ML RE ENEM: 1.0000 | ENEMA | Freq: Once | RECTAL | Status: DC | PRN

## 2019-08-14 MED ORDER — FENTANYL CITRATE (PF) 100 MCG/2ML IJ SOLN
INTRAMUSCULAR | Status: AC
  Filled 2019-08-14: qty 2

## 2019-08-14 MED ORDER — BISACODYL 5 MG PO TBEC: 5.0000 mg | DELAYED_RELEASE_TABLET | Freq: Every day | ORAL | Status: DC | PRN

## 2019-08-14 MED ORDER — ROCURONIUM BROMIDE 10 MG/ML (PF) SYRINGE
PREFILLED_SYRINGE | INTRAVENOUS | Status: AC
  Filled 2019-08-14: qty 20

## 2019-08-14 MED ORDER — LACTATED RINGERS IV SOLN: INTRAVENOUS | Status: DC

## 2019-08-14 MED ORDER — ALUM & MAG HYDROXIDE-SIMETH 200-200-20 MG/5ML PO SUSP: 30.0000 mL | Freq: Four times a day (QID) | ORAL | Status: DC | PRN

## 2019-08-14 MED ORDER — THROMBIN 20000 UNITS EX KIT
PACK | CUTANEOUS | Status: AC
  Filled 2019-08-14: qty 1

## 2019-08-14 MED ORDER — OXYCODONE HCL 5 MG/5ML PO SOLN: 5.0000 mg | Freq: Once | ORAL | Status: DC | PRN

## 2019-08-14 MED ORDER — OXYCODONE HCL 5 MG PO TABS: 5.0000 mg | ORAL_TABLET | Freq: Once | ORAL | Status: DC | PRN

## 2019-08-14 SURGICAL SUPPLY — 80 items
AGENT HMST KT MTR STRL THRMB (HEMOSTASIS)
APL SKNCLS STERI-STRIP NONHPOA (GAUZE/BANDAGES/DRESSINGS) ×1
BENZOIN TINCTURE PRP APPL 2/3 (GAUZE/BANDAGES/DRESSINGS) ×2 IMPLANT
BIT DRILL NEURO 2X3.1 SFT TUCH (MISCELLANEOUS) ×1 IMPLANT
BIT DRILL SKYLINE 14 (BIT) ×1
BIT DRILL SKYLINE 14MM (BIT) IMPLANT
BLADE CLIPPER SURG (BLADE) ×2 IMPLANT
BLADE SURG 15 STRL LF DISP TIS (BLADE) ×1 IMPLANT
BLADE SURG 15 STRL SS (BLADE) ×2
BONE VIVIGEN FORMABLE 1.3CC (Bone Implant) ×2 IMPLANT
CARTRIDGE OIL MAESTRO DRILL (MISCELLANEOUS) ×1 IMPLANT
CLSR STERI-STRIP ANTIMIC 1/2X4 (GAUZE/BANDAGES/DRESSINGS) ×1 IMPLANT
CORD BIPOLAR FORCEPS 12FT (ELECTRODE) ×2 IMPLANT
COVER SURGICAL LIGHT HANDLE (MISCELLANEOUS) ×2 IMPLANT
COVER WAND RF STERILE (DRAPES) ×1 IMPLANT
DIFFUSER DRILL AIR PNEUMATIC (MISCELLANEOUS) ×2 IMPLANT
DRAIN JACKSON RD 7FR 3/32 (WOUND CARE) IMPLANT
DRAPE C-ARM 42X72 X-RAY (DRAPES) ×2 IMPLANT
DRAPE POUCH INSTRU U-SHP 10X18 (DRAPES) ×2 IMPLANT
DRAPE SURG 17X23 STRL (DRAPES) ×6 IMPLANT
DRILL BIT SKYLINE 14MM (BIT) ×2
DRILL NEURO 2X3.1 SOFT TOUCH (MISCELLANEOUS) ×2
DURAPREP 26ML APPLICATOR (WOUND CARE) ×2 IMPLANT
ELECT COATED BLADE 2.86 ST (ELECTRODE) ×2 IMPLANT
ELECT REM PT RETURN 9FT ADLT (ELECTROSURGICAL) ×2
ELECTRODE REM PT RTRN 9FT ADLT (ELECTROSURGICAL) ×1 IMPLANT
EVACUATOR SILICONE 100CC (DRAIN) IMPLANT
GAUZE 4X4 16PLY RFD (DISPOSABLE) ×2 IMPLANT
GAUZE SPONGE 4X4 12PLY STRL (GAUZE/BANDAGES/DRESSINGS) ×2 IMPLANT
GAUZE SPONGE 4X4 12PLY STRL LF (GAUZE/BANDAGES/DRESSINGS) ×1 IMPLANT
GAUZE SPONGE 4X4 16PLY XRAY LF (GAUZE/BANDAGES/DRESSINGS) ×1 IMPLANT
GLOVE BIO SURGEON STRL SZ7 (GLOVE) ×2 IMPLANT
GLOVE BIO SURGEON STRL SZ8 (GLOVE) ×2 IMPLANT
GLOVE BIOGEL PI IND STRL 7.0 (GLOVE) ×2 IMPLANT
GLOVE BIOGEL PI IND STRL 8 (GLOVE) ×1 IMPLANT
GLOVE BIOGEL PI INDICATOR 7.0 (GLOVE) ×2
GLOVE BIOGEL PI INDICATOR 8 (GLOVE) ×1
GLOVE ECLIPSE 6.5 STRL STRAW (GLOVE) ×1 IMPLANT
GLOVE INDICATOR 7.0 STRL GRN (GLOVE) ×1 IMPLANT
GOWN STRL REUS W/ TWL LRG LVL3 (GOWN DISPOSABLE) ×1 IMPLANT
GOWN STRL REUS W/ TWL XL LVL3 (GOWN DISPOSABLE) ×1 IMPLANT
GOWN STRL REUS W/TWL LRG LVL3 (GOWN DISPOSABLE) ×2
GOWN STRL REUS W/TWL XL LVL3 (GOWN DISPOSABLE) ×2
GRAFT BNE MATRIX VG FRMBL SM 1 (Bone Implant) IMPLANT
INTERLOCK LRDTC CRVCL VBR 7MM (Bone Implant) IMPLANT
IV CATH 14GX2 1/4 (CATHETERS) ×2 IMPLANT
KIT BASIN OR (CUSTOM PROCEDURE TRAY) ×2 IMPLANT
KIT TURNOVER KIT B (KITS) ×2 IMPLANT
LORDOTIC CERVICAL VBR 7MM SM (Bone Implant) ×2 IMPLANT
MANIFOLD NEPTUNE II (INSTRUMENTS) ×2 IMPLANT
NDL PRECISIONGLIDE 27X1.5 (NEEDLE) ×1 IMPLANT
NDL SPNL 20GX3.5 QUINCKE YW (NEEDLE) ×1 IMPLANT
NEEDLE PRECISIONGLIDE 27X1.5 (NEEDLE) ×2 IMPLANT
NEEDLE SPNL 20GX3.5 QUINCKE YW (NEEDLE) ×2 IMPLANT
NS IRRIG 1000ML POUR BTL (IV SOLUTION) ×2 IMPLANT
OIL CARTRIDGE MAESTRO DRILL (MISCELLANEOUS) ×2
PACK ORTHO CERVICAL (CUSTOM PROCEDURE TRAY) ×2 IMPLANT
PAD ARMBOARD 7.5X6 YLW CONV (MISCELLANEOUS) ×4 IMPLANT
PATTIES SURGICAL .5 X.5 (GAUZE/BANDAGES/DRESSINGS) IMPLANT
PATTIES SURGICAL .5 X1 (DISPOSABLE) IMPLANT
PIN DISTRACTION 14 (PIN) ×2 IMPLANT
PLATE SKYLINE 12MM (Plate) ×1 IMPLANT
POSITIONER HEAD DONUT 9IN (MISCELLANEOUS) ×2 IMPLANT
SCREW SKYLINE VAR OS 14MM (Screw) ×4 IMPLANT
SPONGE INTESTINAL PEANUT (DISPOSABLE) ×2 IMPLANT
SPONGE SURGIFOAM ABS GEL 100 (HEMOSTASIS) IMPLANT
STRIP CLOSURE SKIN 1/2X4 (GAUZE/BANDAGES/DRESSINGS) ×2 IMPLANT
SURGIFLO W/THROMBIN 8M KIT (HEMOSTASIS) IMPLANT
SUT MNCRL AB 4-0 PS2 18 (SUTURE) ×2 IMPLANT
SUT SILK 4 0 (SUTURE)
SUT SILK 4-0 18XBRD TIE 12 (SUTURE) IMPLANT
SUT VIC AB 2-0 CT2 18 VCP726D (SUTURE) ×2 IMPLANT
SYR BULB IRRIG 60ML STRL (SYRINGE) ×2 IMPLANT
SYR CONTROL 10ML LL (SYRINGE) ×4 IMPLANT
TAPE CLOTH 4X10 WHT NS (GAUZE/BANDAGES/DRESSINGS) ×2 IMPLANT
TAPE UMBILICAL COTTON 1/8X30 (MISCELLANEOUS) ×2 IMPLANT
TOWEL GREEN STERILE (TOWEL DISPOSABLE) ×2 IMPLANT
TOWEL GREEN STERILE FF (TOWEL DISPOSABLE) ×2 IMPLANT
WATER STERILE IRR 1000ML POUR (IV SOLUTION) ×2 IMPLANT
YANKAUER SUCT BULB TIP NO VENT (SUCTIONS) ×2 IMPLANT

## 2019-08-14 NOTE — Anesthesia Postprocedure Evaluation (Addendum)
Anesthesia Post Note  Patient: Benjamin Cortez  Procedure(s) Performed: ANTERIOR CERVICAL DECOMPRESSION FUSION CERVICAL 4-5 WITH INSTRUMENTATION AND ALLOGRAFT (N/A Spine Cervical)     Patient location during evaluation: PACU Anesthesia Type: General Level of consciousness: awake and alert and oriented Pain management: pain level controlled Vital Signs Assessment: post-procedure vital signs reviewed and stable Respiratory status: spontaneous breathing, nonlabored ventilation and respiratory function stable Cardiovascular status: blood pressure returned to baseline and stable Postop Assessment: no apparent nausea or vomiting Anesthetic complications: no   No complications documented.  Last Vitals:  Vitals:   08/14/19 1715 08/14/19 1727  BP:  140/82  Pulse:  68  Resp:  20  Temp: 36.8 C   SpO2:  97%    Last Pain:  Vitals:   08/14/19 1710  TempSrc:   PainSc: 3                  FOSTER,MICHAEL A.

## 2019-08-14 NOTE — Progress Notes (Signed)
One silver/yellow-tone wedding band taken out to patient's wife Tye [place].

## 2019-08-14 NOTE — H&P (Signed)
PREOPERATIVE H&P  Chief Complaint: Progressive deterioration in balance  HPI: Benjamin Cortez is a 74 y.o. male who presents with ongoing progressive deterioration in balance  MRI reveals severe spinal cord compression at C4/5  Patient has failed multiple forms of conservative care and continues to have pain (see office notes for additional details regarding the patient's full course of treatment)  Past Medical History:  Diagnosis Date   Allergic rhinitis    Allergy    Asthmatic bronchitis 1997   controlled with meds-change in work mold exposure   Attention deficit disorder    Meds in past--stopped with retirement   Cancer [facility]) 08/2016   bladder   GERD (gastroesophageal reflux disease)    Hepatitis    "i had hepatitis when i was 16"   History of colonic polyps    [name]   History of kidney stones 1970's   stone basket extraction   Hypertension    Ocular migraine    Plantar fasciitis 2018   Pneumonia    PONV (postoperative nausea and vomiting)    Past Surgical History:  Procedure Laterality Date   APPENDECTOMY  1959   CHOLECYSTECTOMY  2005   COLONOSCOPY     CYSTOSCOPY WITH RETROGRADE PYELOGRAM, URETEROSCOPY AND STENT PLACEMENT Right 09/07/2016   Procedure: CYSTOSCOPY WITH RIGHT RETROGRADE PYELOGRAM;  Surgeon: Alexis Frock, MD;  Location: [place];  Service: Urology;  Laterality: Right;   KIDNEY STONE SURGERY  1970's   Basket extraction   POLYPECTOMY     TONSILLECTOMY  1959   TRANSURETHRAL RESECTION OF BLADDER TUMOR N/A 09/07/2016   Procedure: TRANSURETHRAL RESECTION OF BLADDER TUMOR (TURBT);  Surgeon: Alexis Frock, MD;  Location: [facility];  Service: Urology;  Laterality: N/A;   Social History   Socioeconomic History   Marital status: Married    Spouse name: Juliann Pulse   Number of children: 2   Years of education: 12+   Highest education level: Not on file  Occupational History   Occupation:  Scientist, clinical (histocompatibility and immunogenetics)- Retired    Comment: retired 2014  Tobacco Use   Smoking status: Former Smoker    Packs/day: 1.00    Years: 16.00    Pack years: 16.00    Quit date: 02/07/1977    Years since quitting: 42.5   Smokeless tobacco: Never Used  Vaping Use   Vaping Use: Never used  Substance and Sexual Activity   Alcohol use: Yes    Comment: rarely   Drug use: No   Sexual activity: Yes  Other Topics Concern   Not on file  Social History Narrative   Lives with wife   Caffeine use:tea/coffee daily   Right handed   No living will   Would want wife to make health care decisions-- son Alverda Skeans is alternate   Would accept resuscitation attempts   Not sure about tube feeds   Social Determinants of Health   Financial Resource Strain:    Difficulty of Paying Living Expenses:   Food Insecurity:    Worried About Charity fundraiser in the Last Year:    Arboriculturist in the Last Year:   Transportation Needs:    Film/video editor (Medical):    Lack of Transportation (Non-Medical):   Physical Activity:    Days of Exercise per Week:    Minutes of Exercise per Session:   Stress:    Feeling of Stress :   Social Connections:    Frequency of Communication with Friends  and Family:    Frequency of Social Gatherings with Friends and Family:    Attends Religious Services:    Active Member of Clubs or Organizations:    Attends Music therapist:    Marital Status:    Family History  Problem Relation Age of Onset   Thyroid disease Mother    Dementia Mother    Leukemia Father    Colon cancer Neg Hx    Stomach cancer Neg Hx    Esophageal cancer Neg Hx    Rectal cancer Neg Hx    Prostate cancer Neg Hx    Pancreatic cancer Neg Hx    Allergies  Allergen Reactions   Tetracycline     Rash, blisters   Penicillins     As a child--rash   Codeine Nausea Only    Very disoriented   Prior to Admission medications   Medication Sig Start Date  End Date Taking? Authorizing Provider  aspirin 325 MG tablet Take 325 mg by mouth 2 (two) times a week.     Yes [provider]  cetirizine (ZYRTEC) 10 MG tablet Take 10 mg by mouth daily. Rotates weekly claritin and zyrtec   Yes [provider]  gabapentin (NEURONTIN) 100 MG capsule Take 1 capsule (100 mg total) by mouth 3 (three) times daily. 06/26/19  Yes Marcial Pacas, MD  loratadine (CLARITIN) 10 MG tablet Take 10 mg by mouth daily.   Yes [provider]  Multiple Vitamin (MULTIVITAMIN) tablet Take 1 tablet by mouth daily.   Yes [provider]  omeprazole (PRILOSEC) 20 MG capsule Take 1 capsule (20 mg total) by mouth daily. 03/19/19  Yes Venia Carbon, MD  triamcinolone cream (KENALOG) 0.1 % Apply 1 application topically daily as needed (irritation).  03/24/16  Yes [provider]  losartan-hydrochlorothiazide (HYZAAR) 50-12.5 MG tablet Take 0.5 tablets by mouth daily. MUST SCHEDULE OFFICE VISIT 05/22/19   Viviana Simpler I, MD     All other systems have been reviewed and were otherwise negative with the exception of those mentioned in the HPI and as above.  Physical Exam: There were no vitals filed for this visit.  There is no height or weight on file to calculate BMI.  General: Alert, no acute distress Cardiovascular: No pedal edema Respiratory: No cyanosis, no use of accessory musculature Skin: No lesions in the area of chief complaint Neurologic: Sensation intact distally Psychiatric: Patient is competent for consent with normal mood and affect Lymphatic: No axillary or cervical lymphadenopathy  MUSCULOSKELETAL: + hoffman's bilatearlly  Assessment/Plan: CERVICAL MYELOPATHY Plan for Procedure(s): ANTERIOR CERVICAL DECOMPRESSION FUSION CERVICAL 4-5 WITH INSTRUMENTATION AND ALLOGRAFT   Norva Karvonen, MD 08/14/2019 8:47 AM

## 2019-08-14 NOTE — OR Nursing (Signed)
During the procedure, a human hair was discovered inside the plate caddy, inside the implant tray. Dr. Lynann Bologna was notified that the caddy was contaminated.  The company representative was able to find a peel packed plate to avoid using the plate from the caddy.   Dr. Lynann Bologna was subsequently notified that the entire tray of instruments including plates and screws was contaminated.  He did not respond and continued with the procedure implanting the screws from the tray.

## 2019-08-14 NOTE — Op Note (Signed)
PATIENT NAME: Benjamin Cortez   MEDICAL RECORD NO.:   [id number]    DATE OF BIRTH: July 27, 1945   DATE OF PROCEDURE: 08/14/2019                               OPERATIVE REPORT     PREOPERATIVE DIAGNOSES: 1.  Cervical myelopathy 2.  Large C4-5 disc herniation, resulting in prominent spinal cord compression   POSTOPERATIVE DIAGNOSES: 1.  Cervical myelopathy 2.  Large C4-5 disc herniation, resulting in prominent spinal cord compression   PROCEDURE: 1. Complex anterior cervical decompression and fusion C4/5, including removal of focal bony protrusion at the central spinal canal, with severe focal compression of the spinal cord 2. Placement of anterior instrumentation, C4/5 3. Insertion of interbody device x1 (78mm Titan intervertebral spacer). 4. Intraoperative use of fluoroscopy. 5. Use of morselized allograft - ViviGen.   SURGEON:  Phylliss Bob, MD   ASSISTANT:  Pricilla Holm, PA-C.   ANESTHESIA:  General endotracheal anesthesia.   COMPLICATIONS:  None.   DISPOSITION:  Stable.   ESTIMATED BLOOD LOSS:  Minimal.   INDICATIONS FOR SURGERY:  Briefly, Mr. Shankles is a pleasant 74 -year- old male, who did present to me with symptoms consistent with cervical myelopathy.  Specifically, he was noted to have numbness in his bilateral hands. The patient's MRI did reveal the findings noted above.  Given the patient's cervical myelopathy, and compression of his spinal cord, we did discuss proceeding with the procedure noted above.  The patient was fully aware of the risks and limitations of surgery as outlined in my preoperative note.   OPERATIVE DETAILS:  On 08/14/2019, the patient was brought to surgery and general endotracheal anesthesia was administered.  The patient was placed supine on the hospital bed. The neck was gently extended.  All bony prominences were meticulously padded.  The neck was prepped and draped in the usual sterile fashion.  At this point, I did make a left-sided  transverse incision.  The platysma was incised.  A Smith-Robinson approach was used and the anterior spine was identified. A self-retaining retractor was placed.  I then subperiosteally exposed the vertebral bodies from C4/5.  Caspar pins were then placed into the C4 and C5 vertebral bodies and distraction was applied. An annulotomy was made at the anterior aspect of the C4-5 intervertebral disc.  A series of curettes were used to remove the intervertebral disc, to the level of the posterior vertebral body margin.  At this point, I did use a high-speed bur to remove osteophytes posteriorly.  Upon additional exploration of the posterior intervertebral space, it was readily notable that there was a complex disc osteophyte complex, focally protruded centrally.  Gaining a plane safely between the posterior margin of the osteophyte complex, and the anterior dura, was extremely meticulous, as there were also noted to be adhesions between the osteophyte complex and the dura immediately dorsal to it.  I did meticulously use a micronerve hook however, and I was able to develop a plane, after which point a 1 mm Kerrison punch was used to remove the disc osteophyte complex.  I was able to visualize the anterior dura from the right to left sides across the intervertebral space.  Of note, this portion of the procedure was extremely meticulous and time-consuming, given the significant pressure on the spinal cord and the adhesions to the dura.  Additional osteophyte was noted inferiorly, extending behind the C5 vertebral body.  However, I did feel that the decompression at the level of the intervertebral disc space was adequate, and I did not feel that the risk associated with additional exploration of the osteophyte complex inferiorly was justified, as I did feel that additional attempts to develop a plane to remove the osteophyte would bring with a significant risk of durotomy and/or spinal cord injury.  I did feel that the  spinal canal was thoroughly decompressed at the level of the C4-5 intervertebral disc space.  The endplates were then prepared and the appropriate-sized intervertebral spacer was then packed with ViviGen and tamped into position in the usual fashion. The Caspar pins  then were removed and bone wax was placed in their place.  The appropriate-sized anterior cervical plate was placed over the anterior spine.  14 mm variable angle screws were placed, 2 in each vertebral body from C4-C5 for a total of 4 vertebral body screws.  The screws were then locked to the plate using the Cam locking mechanism.  I was very pleased with the final fluoroscopic images.  The wound was then irrigated.  The wound was then explored for any undue bleeding and there was no bleeding noted. The wound was then closed in layers using 2-0 Vicryl, followed by 4-0 Monocryl.  Benzoin and Steri-Strips were applied, followed by sterile dressing.  All instrument counts were correct at the termination of the procedure.   Of note, Pricilla Holm, PA-C, was my assistant throughout surgery, and did aid in retraction, placement of the hardware, suctioning, and closure from start to finish.     Phylliss Bob, MD

## 2019-08-14 NOTE — Anesthesia Procedure Notes (Signed)
Procedure Name: Intubation Date/Time: 08/14/2019 1:58 PM Performed by: Honest Vanleer T, CRNA Pre-anesthesia Checklist: Patient identified, Emergency Drugs available, Suction available and Patient being monitored Patient Re-evaluated:Patient Re-evaluated prior to induction Oxygen Delivery Method: Circle system utilized Preoxygenation: Pre-oxygenation with 100% oxygen Induction Type: IV induction Ventilation: Mask ventilation without difficulty Laryngoscope Size: Glidescope and 3 Grade View: Grade I Tube type: Oral Tube size: 7.5 mm Number of attempts: 1 Airway Equipment and Method: Stylet,  Oral airway and Video-laryngoscopy Placement Confirmation: ETT inserted through vocal cords under direct vision,  positive ETCO2 and breath sounds checked- equal and bilateral Secured at: 23 cm Tube secured with: Tape Dental Injury: Teeth and Oropharynx as per pre-operative assessment  Difficulty Due To: Difficult Airway- due to anterior larynx and Difficulty was anticipated

## 2019-08-14 NOTE — Transfer of Care (Signed)
Immediate Anesthesia Transfer of Care Note  Patient: Benjamin Cortez  Procedure(s) Performed: ANTERIOR CERVICAL DECOMPRESSION FUSION CERVICAL 4-5 WITH INSTRUMENTATION AND ALLOGRAFT (N/A Spine Cervical)  Patient Location: PACU  Anesthesia Type:General  Level of Consciousness: awake, alert  and oriented  Airway & Oxygen Therapy: Patient Spontanous Breathing and Patient connected to nasal cannula oxygen  Post-op Assessment: Report given to RN, Post -op Vital signs reviewed and stable and Patient moving all extremities  Post vital signs: Reviewed and stable  Last Vitals:  Vitals Value Taken Time  BP 135/86 08/14/19 1623  Temp    Pulse 80 08/14/19 1625  Resp 12 08/14/19 1625  SpO2 97 % 08/14/19 1625  Vitals shown include unvalidated device data.  Last Pain:  Vitals:   08/14/19 1024  TempSrc:   PainSc: 2          Complications: No complications documented.

## 2019-08-14 NOTE — Progress Notes (Signed)
Pharmacy Antibiotic Note  Benjamin Cortez is a 74 y.o. male admitted on 08/14/2019 for surgery for severe spinal cord compression at C4/5.  Now s/p  Cervical decompression fusion.  Pharmacy has been consulted for Vancomycin dosing for surgical prophylaxis.  No drains placed.  Received Vancomycin 1500 mg IV preop dose today at 11:44. Estimated CrCl = 65.8 ml/min   Plan: Vancomycin 1000 mg IV x1, give tonight @ midnight.  Pharmacy will sign off.    Height: 5\' 7"  (170.2 cm) Weight: 100.2 kg (221 lb) IBW/kg (Calculated) : 66.1  Temp (24hrs), Avg:98.3 F (36.8 C), Min:98.2 F (36.8 C), Max:98.4 F (36.9 C)  Recent Labs  Lab 08/08/19 0945  WBC 4.6  CREATININE 1.16    Estimated Creatinine Clearance: 65.8 mL/min (by C-G formula based on SCr of 1.16 mg/dL).    Allergies  Allergen Reactions  . Tetracycline     Rash, blisters  . Penicillins     As a child--rash  . Codeine Nausea Only    Very disoriented   Thank you for allowing pharmacy to be a part of this patient's care.  Nicole Cella, RPh Clinical Pharmacist Please check AMION for all [place] phone numbers After 10:00 PM, call [place] [phone]  08/14/2019 6:09 PM

## 2019-08-15 ENCOUNTER — Encounter (HOSPITAL_COMMUNITY): Payer: Self-pay | Admitting: Orthopedic Surgery

## 2019-08-15 DIAGNOSIS — M5000 Cervical disc disorder with myelopathy, unspecified cervical region: Secondary | ICD-10-CM | POA: Diagnosis not present

## 2019-08-15 MED ORDER — DIPHENHYDRAMINE HCL 25 MG PO CAPS
25.0000 mg | ORAL_CAPSULE | ORAL | Status: DC | PRN
  Administered 2019-08-15 (×2): 25 mg via ORAL
  Filled 2019-08-15: qty 1

## 2019-08-15 MED ORDER — METHOCARBAMOL 500 MG PO TABS: 500.0000 mg | ORAL_TABLET | Freq: Four times a day (QID) | ORAL | 1 refills | Status: DC | PRN

## 2019-08-15 MED ORDER — DIPHENHYDRAMINE HCL 25 MG PO CAPS
ORAL_CAPSULE | ORAL | Status: AC
  Filled 2019-08-15: qty 1

## 2019-08-15 MED ORDER — OXYCODONE-ACETAMINOPHEN 5-325 MG PO TABS: 1.0000 | ORAL_TABLET | ORAL | 0 refills | Status: DC | PRN

## 2019-08-15 MED FILL — Thrombin For Soln Kit 20000 Unit: CUTANEOUS | Qty: 1 | Status: AC

## 2019-08-15 NOTE — Progress Notes (Signed)
Pt doing well. Pt and wife given D/C instructions with verbal understanding. Rx's were sent to the pharmacy by MD. Pt's incision is clean and dry with no sign of infection. Pt's IV was removed prior to D/C. Pt D/C'd home via wheelchair per MD order. Pt is stable @ D/C and has no other needs at this time. Bridgitte Felicetti, RN  

## 2019-08-15 NOTE — Progress Notes (Signed)
    Patient doing well  Denies arm pain Tolerating PO well Patient notes improvement in b/l hand numbness   Physical Exam: Vitals:   08/14/19 2308 08/15/19 0527  BP: 124/63 114/64  Pulse: 74 72  Resp: 20 20  Temp: 97.8 F (36.6 C) 98 F (36.7 C)  SpO2: 94% 96%    Neck soft/supple Dressing in place NVI  POD #1 s/p ACDF, doing well  - encourage ambulation - Percocet for pain, Robaxin for muscle spasms - d/c home today with f/u in 2 weeks

## 2019-08-28 DIAGNOSIS — Z9889 Other specified postprocedural states: Secondary | ICD-10-CM | POA: Diagnosis not present

## 2019-08-28 NOTE — Discharge Summary (Signed)
Patient ID: Mj Willis MRN: [id number] DOB/AGE: Oct 17, 1945 74 y.o.  Admit date: 08/14/2019 Discharge date: 08/15/2019  Admission Diagnoses:  Active Problems:   Myelopathy [facility])   Discharge Diagnoses:  Same  Past Medical History:  Diagnosis Date  . Allergic rhinitis   . Allergy   . Asthmatic bronchitis 1997   controlled with meds-change in work mold exposure  . Attention deficit disorder    Meds in past--stopped with retirement  . Cancer ([place]) 08/2016   bladder  . GERD (gastroesophageal reflux disease)   . Hepatitis    "i had hepatitis when i was 16"  . History of colonic polyps    [name]  . History of kidney stones 1970's   stone basket extraction  . Hypertension   . Ocular migraine   . Plantar fasciitis 2018  . Pneumonia   . PONV (postoperative nausea and vomiting)     Surgeries: Procedure(s): ANTERIOR CERVICAL DECOMPRESSION FUSION CERVICAL 4-5 WITH INSTRUMENTATION AND ALLOGRAFT on 08/14/2019   Consultants: None  Discharged Condition: Improved  Hospital Course: Joy Haegele is an 74 y.o. male who was admitted 08/14/2019 for operative treatment of myelopathy. Patient has severe unremitting pain that affects sleep, daily activities, and work/hobbies. After pre-op clearance the patient was taken to the operating room on 08/14/2019 and underwent  Procedure(s): ANTERIOR CERVICAL DECOMPRESSION FUSION CERVICAL 4-5 WITH INSTRUMENTATION AND ALLOGRAFT.    Patient was given perioperative antibiotics:  Anti-infectives (From admission, onward)   Start     Dose/Rate Route Frequency Ordered Stop   08/15/19 0000  vancomycin (VANCOCIN) IVPB 1000 mg/200 mL premix        1,000 mg 200 mL/hr over 60 Minutes Intravenous  Once 08/14/19 1818 08/15/19 0008   08/14/19 0600  vancomycin (VANCOREADY) IVPB 1500 mg/300 mL        1,500 mg 150 mL/hr over 120 Minutes Intravenous To ShortStay Surgical 08/13/19 0651 08/14/19 1344       Patient was given sequential compression devices, early  ambulation to prevent DVT.  Patient benefited maximally from hospital stay and there were no complications.    Recent vital signs: BP 133/64 (BP Location: Left Arm)   Pulse 84   Temp 97.8 F (36.6 C) (Oral)   Resp 18   Ht 5\' 7"  (1.702 m)   Wt 100.2 kg   SpO2 95%   BMI 34.61 kg/m    Discharge Medications:   Allergies as of 08/15/2019      Reactions   Tetracycline    Rash, blisters   Penicillins    As a child--rash   Codeine Nausea Only   Very disoriented      Medication List    TAKE these medications   cetirizine 10 MG tablet Commonly known as: ZYRTEC Take 10 mg by mouth daily. Rotates weekly claritin and zyrtec   loratadine 10 MG tablet Commonly known as: CLARITIN Take 10 mg by mouth daily.   losartan-hydrochlorothiazide 50-12.5 MG tablet Commonly known as: HYZAAR Take 0.5 tablets by mouth daily. MUST SCHEDULE OFFICE VISIT   methocarbamol 500 MG tablet Commonly known as: ROBAXIN Take 1 tablet (500 mg total) by mouth every 6 (six) hours as needed for muscle spasms.   multivitamin tablet Take 1 tablet by mouth daily.   omeprazole 20 MG capsule Commonly known as: PRILOSEC Take 1 capsule (20 mg total) by mouth daily.   oxyCODONE-acetaminophen 5-325 MG tablet Commonly known as: PERCOCET/ROXICET Take 1-2 tablets by mouth every 4 (four) hours as needed for moderate pain  or severe pain.   triamcinolone cream 0.1 % Commonly known as: KENALOG Apply 1 application topically daily as needed (irritation).       Diagnostic Studies: DG Cervical Spine 2-3 Views  Result Date: 08/14/2019 CLINICAL DATA:  C4-5 ACDF. EXAM: CERVICAL SPINE - 2-3 VIEW; DG C-ARM 1-60 MIN COMPARISON:  None. FINDINGS: Single lateral fluoroscopic spot view obtained in the operating room. Anterior fusion C4-C5 with interbody spacer, numbering based on a included uppermost cervical vertebra being C2. Fluoroscopy time 13 seconds. Dose 2.14 mGy. IMPRESSION: Procedural fluoroscopy after C4-C5 anterior  fusion. Electronically Signed   By: Keith Rake M.D.   On: 08/14/2019 18:08   CT BIOPSY  Result Date: 08/13/2019 CLINICAL DATA:  Left buttock, hip, and groin pain. Suspected sacroiliac joint dysfunction. EXAM: CT-GUIDED LEFT SACROILIAC JOINT INJECTION PROCEDURE: After a thorough discussion of risks and benefits of the procedure, including bleeding, infection, injury to nerves, blood vessels, and adjacent structures, verbal and written consent was obtained. The patient was placed prone on the CT table and localization was performed over the sacrum. Target site was marked using CT guidance. The skin was prepped and draped in the usual sterile fashion using Betadine soap. After local anesthesia with 1% lidocaine without epinephrine and subsequent deep anesthesia, a 3.5 inch 22 gauge spinal needle was advanced into the left SI joint. Injection of 0.5 ml Isovue-M 200 confirmed intra-articular placement. No vascular uptake present. Subsequently, 2 mL of 0.25% bupivacaine were injected into the left SI joint. Needle was removed and a sterile dressing applied. No complications were observed. The patient was observed and released under the care of a driver after 30 minutes. IMPRESSION: Successful CT guided left SI joint injection with anesthetic only. Electronically Signed   By: Titus Dubin M.D.   On: 08/13/2019 09:33   DG C-Arm 1-60 Min  Result Date: 08/14/2019 CLINICAL DATA:  C4-5 ACDF. EXAM: CERVICAL SPINE - 2-3 VIEW; DG C-ARM 1-60 MIN COMPARISON:  None. FINDINGS: Single lateral fluoroscopic spot view obtained in the operating room. Anterior fusion C4-C5 with interbody spacer, numbering based on a included uppermost cervical vertebra being C2. Fluoroscopy time 13 seconds. Dose 2.14 mGy. IMPRESSION: Procedural fluoroscopy after C4-C5 anterior fusion. Electronically Signed   By: Keith Rake M.D.   On: 08/14/2019 18:08    Disposition: Discharge disposition: 01-Home or Self Care        POD #1  s/p ACDF, doing well  - encourage ambulation - Percocet for pain, Robaxin for muscle spasms -Scripts for pain sent to pharmacy electronically  -D/C instructions sheet printed and in chart -D/C today  -F/U in office 2 weeks   Signed: Lennie Muckle Lavinia Mcneely 08/28/2019, 1:20 PM

## 2019-09-23 ENCOUNTER — Telehealth: Payer: Self-pay

## 2019-09-23 NOTE — Telephone Encounter (Signed)
Spoke to pt about recent back surgery. He said he is back in the gym starting PT and is doing well.

## 2019-09-24 DIAGNOSIS — M4807 Spinal stenosis, lumbosacral region: Secondary | ICD-10-CM | POA: Diagnosis not present

## 2019-09-24 DIAGNOSIS — Z9889 Other specified postprocedural states: Secondary | ICD-10-CM | POA: Diagnosis not present

## 2019-10-15 DIAGNOSIS — C670 Malignant neoplasm of trigone of bladder: Secondary | ICD-10-CM | POA: Diagnosis not present

## 2019-10-15 DIAGNOSIS — R3915 Urgency of urination: Secondary | ICD-10-CM | POA: Diagnosis not present

## 2019-10-15 DIAGNOSIS — N202 Calculus of kidney with calculus of ureter: Secondary | ICD-10-CM | POA: Diagnosis not present

## 2019-11-25 DIAGNOSIS — M545 Low back pain, unspecified: Secondary | ICD-10-CM | POA: Diagnosis not present

## 2019-11-25 DIAGNOSIS — M542 Cervicalgia: Secondary | ICD-10-CM | POA: Diagnosis not present

## 2020-03-16 ENCOUNTER — Ambulatory Visit (INDEPENDENT_AMBULATORY_CARE_PROVIDER_SITE_OTHER): Payer: PPO | Admitting: Internal Medicine

## 2020-03-16 ENCOUNTER — Encounter: Payer: Self-pay | Admitting: Internal Medicine

## 2020-03-16 ENCOUNTER — Other Ambulatory Visit: Payer: Self-pay

## 2020-03-16 VITALS — BP 138/84 | HR 59 | Temp 97.3°F | Ht 67.0 in | Wt 217.0 lb

## 2020-03-16 DIAGNOSIS — K219 Gastro-esophageal reflux disease without esophagitis: Secondary | ICD-10-CM | POA: Diagnosis not present

## 2020-03-16 DIAGNOSIS — Z8551 Personal history of malignant neoplasm of bladder: Secondary | ICD-10-CM

## 2020-03-16 DIAGNOSIS — G629 Polyneuropathy, unspecified: Secondary | ICD-10-CM

## 2020-03-16 DIAGNOSIS — Z7189 Other specified counseling: Secondary | ICD-10-CM | POA: Diagnosis not present

## 2020-03-16 DIAGNOSIS — J301 Allergic rhinitis due to pollen: Secondary | ICD-10-CM | POA: Diagnosis not present

## 2020-03-16 DIAGNOSIS — I10 Essential (primary) hypertension: Secondary | ICD-10-CM | POA: Diagnosis not present

## 2020-03-16 DIAGNOSIS — Z0000 Encounter for general adult medical examination without abnormal findings: Secondary | ICD-10-CM | POA: Diagnosis not present

## 2020-03-16 MED ORDER — LOSARTAN POTASSIUM-HCTZ 50-12.5 MG PO TABS: 0.5000 | ORAL_TABLET | Freq: Every day | ORAL | 3 refills | Status: DC

## 2020-03-16 MED ORDER — OMEPRAZOLE 20 MG PO CPDR: 20.0000 mg | DELAYED_RELEASE_CAPSULE | Freq: Every day | ORAL | 3 refills | Status: DC

## 2020-03-16 NOTE — Assessment & Plan Note (Signed)
Does well with omeprazole Doesn't do well with skipped dose

## 2020-03-16 NOTE — Assessment & Plan Note (Signed)
Gets yearly cystoscopy by Dr Tresa Moore

## 2020-03-16 NOTE — Progress Notes (Signed)
Hearing Screening   125Hz  250Hz  500Hz  1000Hz  2000Hz  3000Hz  4000Hz  6000Hz  8000Hz   Right ear:           Left ear:           Comments: Making appt with audiology  Vision Screening Comments: October 2021

## 2020-03-16 NOTE — Assessment & Plan Note (Signed)
Gabapentin helps 

## 2020-03-16 NOTE — Progress Notes (Signed)
Subjective:    Patient ID: Benjamin Cortez, male    DOB: September 18, 1945, 75 y.o.   MRN: [id number]  HPI Here for Medicare wellness visit and follow up of chronic health conditions This visit occurred during the SARS-CoV-2 public health emergency.  Safety protocols were in place, including screening questions prior to the visit, additional usage of staff PPE, and extensive cleaning of exam room while observing appropriate contact time as indicated for disinfecting solutions.   Reviewed form and advanced directives Reviewed other doctors No alcohol or tobacco Exercises regularly--walks most days (occasional gym) Vision is okay--notices the cataracts when driving at night Hearing is not great--per wife and he notes. Will look into hearing aides No falls--mild balance issues after spine surgery (better after balance exercises) No depression or anhedonia Independent with instrumental ADLs No sig memory issues  Had fusion in cervical spine Recovered and back to normal  Overdue for colonoscopy--will contact Dr Havery Moros to set up Did see some blood---thinks it was hemorrhoids  Yearly cysto for past bladder cancer  No chest pain or SOB Exercise tolerance is good No dizziness or syncope Slight edema in evening---better in AM No palpitations  Takes the omeprazole daily--has tolerated missing a dose No heartburn or dysphagia  Neuropathy in feet Takes low dose gabapentin  Takes OTC antihistamines for allergies Takes daily ---alternates the two  Current Outpatient Medications on File Prior to Visit  Medication Sig Dispense Refill  . cetirizine (ZYRTEC) 10 MG tablet Take 10 mg by mouth daily. Rotates weekly claritin and zyrtec    . gabapentin (NEURONTIN) 100 MG capsule Take 100 mg by mouth 3 (three) times daily.    Marland Kitchen loratadine (CLARITIN) 10 MG tablet Take 10 mg by mouth daily.    Marland Kitchen losartan-hydrochlorothiazide (HYZAAR) 50-12.5 MG tablet Take 0.5 tablets by mouth daily. MUST SCHEDULE  OFFICE VISIT 45 tablet 3  . Misc Natural Products (GLUCOSAMINE CHOND DOUBLE STR PO) Take by mouth.    . Multiple Vitamin (MULTIVITAMIN) tablet Take 1 tablet by mouth daily.    Marland Kitchen omeprazole (PRILOSEC) 20 MG capsule Take 1 capsule (20 mg total) by mouth daily. 90 capsule 3   No current facility-administered medications on file prior to visit.    Allergies  Allergen Reactions  . Tetracycline     Rash, blisters  . Penicillins     As a child--rash  . Codeine Nausea Only    Very disoriented    Past Medical History:  Diagnosis Date  . Allergic rhinitis   . Allergy   . Asthmatic bronchitis 1997   controlled with meds-change in work mold exposure  . Attention deficit disorder    Meds in past--stopped with retirement  . Cancer ([facility]) 08/2016   bladder  . GERD (gastroesophageal reflux disease)   . Hepatitis    "i had hepatitis when i was 16"  . History of colonic polyps    [name]  . History of kidney stones 1970's   stone basket extraction  . Hypertension   . Ocular migraine   . Plantar fasciitis 2018  . Pneumonia   . PONV (postoperative nausea and vomiting)     Past Surgical History:  Procedure Laterality Date  . ANTERIOR CERVICAL DECOMP/DISCECTOMY FUSION N/A 08/14/2019   Procedure: ANTERIOR CERVICAL DECOMPRESSION FUSION CERVICAL 4-5 WITH INSTRUMENTATION AND ALLOGRAFT;  Surgeon: Phylliss Bob, MD;  Location: [facility];  Service: Orthopedics;  Laterality: N/A;  . APPENDECTOMY  1959  . CHOLECYSTECTOMY  2005  . COLONOSCOPY    . CYSTOSCOPY  WITH RETROGRADE PYELOGRAM, URETEROSCOPY AND STENT PLACEMENT Right 09/07/2016   Procedure: CYSTOSCOPY WITH RIGHT RETROGRADE PYELOGRAM;  Surgeon: Alexis Frock, MD;  Location: [facility];  Service: Urology;  Laterality: Right;  . KIDNEY STONE SURGERY  1970's   Basket extraction  . POLYPECTOMY    . TONSILLECTOMY  1959  . TRANSURETHRAL RESECTION OF BLADDER TUMOR N/A 09/07/2016   Procedure: TRANSURETHRAL RESECTION OF BLADDER TUMOR  (TURBT);  Surgeon: Alexis Frock, MD;  Location: [facility];  Service: Urology;  Laterality: N/A;    Family History  Problem Relation Age of Onset  . Thyroid disease Mother   . Dementia Mother   . Leukemia Father   . Colon cancer Neg Hx   . Stomach cancer Neg Hx   . Esophageal cancer Neg Hx   . Rectal cancer Neg Hx   . Prostate cancer Neg Hx   . Pancreatic cancer Neg Hx     Social History   Socioeconomic History  . Marital status: Married    Spouse name: Juliann Pulse  . Number of children: 2  . Years of education: 12+  . Highest education level: Not on file  Occupational History  . Occupation: Scientist, clinical (histocompatibility and immunogenetics)- Retired    Comment: retired 2014  Tobacco Use  . Smoking status: Former Smoker    Packs/day: 1.00    Years: 16.00    Pack years: 16.00    Quit date: 02/07/1977    Years since quitting: 43.1  . Smokeless tobacco: Never Used  Vaping Use  . Vaping Use: Never used  Substance and Sexual Activity  . Alcohol use: Yes    Comment: rarely  . Drug use: No  . Sexual activity: Yes  Other Topics Concern  . Not on file  Social History Narrative   Lives with wife   Caffeine use:tea/coffee daily   Right handed   Has living will   Would want wife to make health care decisions-- son Alverda Skeans is alternate   Would accept resuscitation attempts   Not sure about tube feeds   Social Determinants of Health   Financial Resource Strain: Not on file  Food Insecurity: Not on file  Transportation Needs: Not on file  Physical Activity: Not on file  Stress: Not on file  Social Connections: Not on file  Intimate Partner Violence: Not on file   Review of Systems Appetite is good Has lost a few pounds Sleeps well Wears seat belt Needs a couple of crowns Has bump on back wife wanted checked. He can feel it at times Some eczema on legs-uses cerave Bowels okay but did note some blood Urinary stream is slow at times--especially in the morning. Nothing bothersome Some  left hip pain or S-I joint. Better with exercises     Objective:   Physical Exam Constitutional:      Appearance: Normal appearance.  HENT:     Mouth/Throat:     Comments: No lesions Eyes:     Conjunctiva/sclera: Conjunctivae normal.     Pupils: Pupils are equal, round, and reactive to light.  Cardiovascular:     Rate and Rhythm: Normal rate and regular rhythm.     Pulses: Normal pulses.     Heart sounds: No murmur heard. No gallop.   Pulmonary:     Breath sounds: Normal breath sounds. No wheezing or rales.  Abdominal:     Palpations: Abdomen is soft.     Tenderness: There is no abdominal tenderness.  Musculoskeletal:     Cervical  back: Neck supple.     Right lower leg: No edema.     Left lower leg: No edema.  Lymphadenopathy:     Cervical: No cervical adenopathy.  Skin:    Findings: No rash.     Comments: ~3cm cyst on mid back---no inflammation  Neurological:     Mental Status: He is alert and oriented to person, place, and time.     Comments: President--- "Waymond Cera, black fella" [phone] [id number] Recall 3/3  Psychiatric:        Mood and Affect: Mood normal.        Behavior: Behavior normal.            Assessment & Plan:

## 2020-03-16 NOTE — Assessment & Plan Note (Signed)
BP Readings from Last 3 Encounters:  03/16/20 138/84  08/15/19 133/64  08/08/19 136/66   Doing well on losartan/HCTZ Labs in July fine---and recently for insurance (I reviewed on his phone and was fine)

## 2020-03-16 NOTE — Assessment & Plan Note (Signed)
I have personally reviewed the Medicare Annual Wellness questionnaire and have noted 1. The patient's medical and social history 2. Their use of alcohol, tobacco or illicit drugs 3. Their current medications and supplements 4. The patient's functional ability including ADL's, fall risks, home safety risks and hearing or visual             impairment. 5. Diet and physical activities 6. Evidence for depression or mood disorders  The patients weight, height, BMI and visual acuity have been recorded in the chart I have made referrals, counseling and provided education to the patient based review of the above and I have provided the pt with a written personalized care plan for preventive services.  I have provided you with a copy of your personalized plan for preventive services. Please take the time to review along with your updated medication list.  Overdue for colonoscopy--will set up with Dr Havery Moros Recent PSA for insurance--normal Exercises regularly Had COVID booster and flu vaccines

## 2020-03-16 NOTE — Assessment & Plan Note (Signed)
Does well with alternating cetirizine and loratadine

## 2020-03-16 NOTE — Assessment & Plan Note (Signed)
Now has formal living will

## 2020-03-17 ENCOUNTER — Telehealth: Payer: Self-pay

## 2020-03-17 NOTE — Telephone Encounter (Signed)
-----   Message from Yetta Flock, MD sent at 03/16/2020  6:06 PM EST ----- Got it, we will take care of it and get him scheduled. Thanks  [facility] can you help reach out to this patient to schedule him for a colonoscopy in the [facility] with me. Thanks   ----- Message ----- From: Venia Carbon, MD Sent: 03/16/2020   3:07 PM EST To: Yetta Flock, MD  Remo Lipps, He was out of town for a while ---missed his colonoscopy. He is ready to schedule this now if your staff can set this up. Thanks! Rich

## 2020-03-17 NOTE — Telephone Encounter (Signed)
Lm on vm for patient to return call 

## 2020-03-19 NOTE — Telephone Encounter (Signed)
Spoke with patient, he has been scheduled for a pre-visit on Wednesday, 04/08/20 at 9 am. Colonoscopy is scheduled with Dr. Havery Moros in the [facility] on Thursday, 04/23/20 at 4 PM, patient is aware that he will need to arrive at 3 PM. Patient verbalized understanding of all information and had no concerns at the end of the call.

## 2020-04-08 ENCOUNTER — Other Ambulatory Visit: Payer: Self-pay

## 2020-04-08 ENCOUNTER — Ambulatory Visit (AMBULATORY_SURGERY_CENTER): Payer: Self-pay

## 2020-04-08 VITALS — Ht 67.0 in | Wt 219.0 lb

## 2020-04-08 DIAGNOSIS — Z86010 Personal history of colonic polyps: Secondary | ICD-10-CM

## 2020-04-08 MED ORDER — SUTAB 1479-225-188 MG PO TABS: 1.0000 | ORAL_TABLET | ORAL | 0 refills | Status: DC

## 2020-04-08 NOTE — Progress Notes (Signed)
No egg or soy allergy known to patient  No issues with past sedation with any surgeries or procedures No intubation problems in the past  No FH of Malignant Hyperthermia No diet pills per patient No home 02 use per patient  No blood thinners per patient  Pt denies issues with constipation  No A fib or A flutter  EMMI video via MyChart  COVID 19 guidelines implemented in PV today with Pt and RN  Pt denies loose or missing teeth, dentures, partials, dental implants, capped or bonded teeth;  Coupon given to pt in PV today , Code to Pharmacy and  NO PA's for preps discussed with pt in PV today  Discussed with pt there will be an out-of-pocket cost for prep and that varies from $0 to 70 dollars;  Due to the COVID-19 pandemic we are asking patients to follow certain guidelines.   Pt aware of COVID protocols and [facility] guidelines   

## 2020-04-13 ENCOUNTER — Encounter: Payer: Self-pay | Admitting: Gastroenterology

## 2020-04-23 ENCOUNTER — Ambulatory Visit (AMBULATORY_SURGERY_CENTER): Payer: PPO | Admitting: Gastroenterology

## 2020-04-23 ENCOUNTER — Other Ambulatory Visit: Payer: Self-pay

## 2020-04-23 ENCOUNTER — Encounter: Payer: Self-pay | Admitting: Gastroenterology

## 2020-04-23 VITALS — BP 130/70 | HR 62 | Temp 97.9°F | Resp 24 | Ht 67.0 in | Wt 219.0 lb

## 2020-04-23 DIAGNOSIS — D122 Benign neoplasm of ascending colon: Secondary | ICD-10-CM

## 2020-04-23 DIAGNOSIS — Z86010 Personal history of colonic polyps: Secondary | ICD-10-CM | POA: Diagnosis not present

## 2020-04-23 DIAGNOSIS — D125 Benign neoplasm of sigmoid colon: Secondary | ICD-10-CM | POA: Diagnosis not present

## 2020-04-23 DIAGNOSIS — D120 Benign neoplasm of cecum: Secondary | ICD-10-CM | POA: Diagnosis not present

## 2020-04-23 DIAGNOSIS — D123 Benign neoplasm of transverse colon: Secondary | ICD-10-CM

## 2020-04-23 MED ORDER — SODIUM CHLORIDE 0.9 % IV SOLN
500.0000 mL | Freq: Once | INTRAVENOUS | Status: DC
Start: 2020-04-23 — End: 2020-04-23

## 2020-04-23 NOTE — Progress Notes (Signed)
Pt's states no medical or surgical changes since previsit or office visit. 

## 2020-04-23 NOTE — Progress Notes (Signed)
PT taken to PACU. Monitors in place. VSS. Report given to RN. 

## 2020-04-23 NOTE — Op Note (Signed)
[place] Patient Name: Deyon Chizek Procedure Date: 04/23/2020 4:42 PM MRN: [id number] Endoscopist: Remo Lipps P. Havery Moros , MD Age: 75 Referring MD:  Date of Birth: 1946/01/15 Gender: Male Account #: [id number] Procedure:                Colonoscopy Indications:              High risk colon cancer surveillance: Personal                            history of colonic polyps (multiple adenomas                            removed in 11/2016) Medicines:                Monitored Anesthesia Care Procedure:                Pre-Anesthesia Assessment:                           - Prior to the procedure, a History and Physical                            was performed, and patient medications and                            allergies were reviewed. The patient's tolerance of                            previous anesthesia was also reviewed. The risks                            and benefits of the procedure and the sedation                            options and risks were discussed with the patient.                            All questions were answered, and informed consent                            was obtained. Prior Anticoagulants: The patient has                            taken no previous anticoagulant or antiplatelet                            agents. ASA Grade Assessment: II - A patient with                            mild systemic disease. After reviewing the risks                            and benefits, the patient was deemed in  satisfactory condition to undergo the procedure.                           After obtaining informed consent, the colonoscope                            was passed under direct vision. Throughout the                            procedure, the patient's blood pressure, pulse, and                            oxygen saturations were monitored continuously. The                            Olympus [id number]) Colonoscope was                             introduced through the anus and advanced to the the                            cecum, identified by appendiceal orifice and                            ileocecal valve. The colonoscopy was performed                            without difficulty. The patient tolerated the                            procedure well. The quality of the bowel                            preparation was good. The ileocecal valve,                            appendiceal orifice, and rectum were photographed. Scope In: 4:45:18 PM Scope Out: 5:02:43 PM Scope Withdrawal Time: 0 hours 13 minutes 56 seconds  Total Procedure Duration: 0 hours 17 minutes 25 seconds  Findings:                 The perianal and digital rectal examinations were                            normal.                           Two sessile polyps were found in the cecum. The                            polyps were 2 to 4 mm in size. These polyps were                            removed with a cold snare. Resection and retrieval  were complete.                           A diminutive polyp was found in the ascending                            colon. The polyp was sessile. The polyp was removed                            with a cold snare. Resection and retrieval were                            complete.                           Multiple medium-mouthed diverticula were found in                            the transverse colon and left colon.                           A 5 mm polyp was found in the hepatic flexure. The                            polyp was sessile. The polyp was removed with a                            cold snare. Resection and retrieval were complete.                           Two sessile polyps were found in the transverse                            colon. The polyps were 4 to 5 mm in size. These                            polyps were removed with a cold snare. Resection                             and retrieval were complete.                           A 3 mm polyp was found in the sigmoid colon. The                            polyp was sessile. The polyp was removed with a                            cold snare. Resection and retrieval were complete.                           Internal hemorrhoids were found during retroflexion.  The exam was otherwise without abnormality. Complications:            No immediate complications. Estimated blood loss:                            Minimal. Estimated Blood Loss:     Estimated blood loss was minimal. Impression:               - Two 2 to 4 mm polyps in the cecum, removed with a                            cold snare. Resected and retrieved.                           - One diminutive polyp in the ascending colon,                            removed with a cold snare. Resected and retrieved.                           - Diverticulosis in the transverse colon and in the                            left colon.                           - One 5 mm polyp at the hepatic flexure, removed                            with a cold snare. Resected and retrieved.                           - Two 4 to 5 mm polyps in the transverse colon,                            removed with a cold snare. Resected and retrieved.                           - One 3 mm polyp in the sigmoid colon, removed with                            a cold snare. Resected and retrieved.                           - Internal hemorrhoids.                           - The examination was otherwise normal. Recommendation:           - Patient has a contact number available for                            emergencies. The signs and symptoms of potential  delayed complications were discussed with the                            patient. Return to normal activities tomorrow.                            Written discharge instructions were provided to  the                            patient.                           - Resume previous diet.                           - Continue present medications.                           - Await pathology results. Remo Lipps P. Nagee Goates, MD 04/23/2020 5:09:56 PM This report has been signed electronically.

## 2020-04-23 NOTE — Progress Notes (Signed)
No problems noted in the recovery room. maw 

## 2020-04-23 NOTE — Progress Notes (Signed)
Called to room to assist during endoscopic procedure.  Patient ID and intended procedure confirmed with present staff. Received instructions for my participation in the procedure from the performing physician.  

## 2020-04-23 NOTE — Patient Instructions (Addendum)
Handouts were given to your care partner on polyps and hemorrhoids. You may resume your current medications today. Await biopsy results.  May take 1-3 weeks to receive pathology results. Please call if any questions or concerns.     YOU HAD AN ENDOSCOPIC PROCEDURE TODAY AT THE [facility]:   Refer to the procedure report that was given to you for any specific questions about what was found during the examination.  If the procedure report does not answer your questions, please call your gastroenterologist to clarify.  If you requested that your care partner not be given the details of your procedure findings, then the procedure report has been included in a sealed envelope for you to review at your convenience later.  YOU SHOULD EXPECT: Some feelings of bloating in the abdomen. Passage of more gas than usual.  Walking can help get rid of the air that was put into your GI tract during the procedure and reduce the bloating. If you had a lower endoscopy (such as a colonoscopy or flexible sigmoidoscopy) you may notice spotting of blood in your stool or on the toilet paper. If you underwent a bowel prep for your procedure, you may not have a normal bowel movement for a few days.  Please Note:  You might notice some irritation and congestion in your nose or some drainage.  This is from the oxygen used during your procedure.  There is no need for concern and it should clear up in a day or so.  SYMPTOMS TO REPORT IMMEDIATELY:   Following lower endoscopy (colonoscopy or flexible sigmoidoscopy):  Excessive amounts of blood in the stool  Significant tenderness or worsening of abdominal pains  Swelling of the abdomen that is new, acute  Fever of 100F or higher   For urgent or emergent issues, a gastroenterologist can be reached at any hour by calling [phone]. Do not use MyChart messaging for urgent concerns.    DIET:  We do recommend a small meal at first, but then you may proceed  to your regular diet.  Drink plenty of fluids but you should avoid alcoholic beverages for 24 hours.  ACTIVITY:  You should plan to take it easy for the rest of today and you should NOT DRIVE or use heavy machinery until tomorrow (because of the sedation medicines used during the test).    FOLLOW UP: Our staff will call the number listed on your records 48-72 hours following your procedure to check on you and address any questions or concerns that you may have regarding the information given to you following your procedure. If we do not reach you, we will leave a message.  We will attempt to reach you two times.  During this call, we will ask if you have developed any symptoms of COVID 19. If you develop any symptoms (ie: fever, flu-like symptoms, shortness of breath, cough etc.) before then, please call [phone].  If you test positive for Covid 19 in the 2 weeks post procedure, please call and report this information to us.    If any biopsies were taken you will be contacted by phone or by letter within the next 1-3 weeks.  Please call us at [phone] if you have not heard about the biopsies in 3 weeks.    SIGNATURES/CONFIDENTIALITY: You and/or your care partner have signed paperwork which will be entered into your electronic medical record.  These signatures attest to the fact that that the information above on your After Visit   Summary has been reviewed and is understood.  Full responsibility of the confidentiality of this discharge information lies with you and/or your care-partner. 

## 2020-04-27 ENCOUNTER — Telehealth: Payer: Self-pay | Admitting: *Deleted

## 2020-04-27 NOTE — Telephone Encounter (Signed)
  Follow up Call-  Call back number 04/23/2020  Post procedure Call Back phone  # [phone]  Permission to leave phone message Yes  Some recent data might be hidden     Patient questions:  Do you have a fever, pain , or abdominal swelling? No. Pain Score  0 *  Have you tolerated food without any problems? Yes.    Have you been able to return to your normal activities? Yes.    Do you have any questions about your discharge instructions: Diet   No. Medications  No. Follow up visit  No.  Do you have questions or concerns about your Care? No.  Actions: * If pain score is 4 or above: No action needed, pain <4.  1. Have you developed a fever since your procedure?no  2.   Have you had an respiratory symptoms (SOB or cough) since your procedure? no  3.   Have you tested positive for COVID 19 since your procedure no  4.   Have you had any family members/close contacts diagnosed with the COVID 19 since your procedure? no   If yes to any of these questions please route to Joylene John, RN and Joella Prince, RN

## 2020-05-04 DIAGNOSIS — H25813 Combined forms of age-related cataract, bilateral: Secondary | ICD-10-CM | POA: Diagnosis not present

## 2020-05-04 DIAGNOSIS — H524 Presbyopia: Secondary | ICD-10-CM | POA: Diagnosis not present

## 2020-05-04 DIAGNOSIS — H31092 Other chorioretinal scars, left eye: Secondary | ICD-10-CM | POA: Diagnosis not present

## 2020-05-04 DIAGNOSIS — H43813 Vitreous degeneration, bilateral: Secondary | ICD-10-CM | POA: Diagnosis not present

## 2020-05-04 DIAGNOSIS — H43822 Vitreomacular adhesion, left eye: Secondary | ICD-10-CM | POA: Diagnosis not present

## 2020-06-08 DIAGNOSIS — H43813 Vitreous degeneration, bilateral: Secondary | ICD-10-CM | POA: Diagnosis not present

## 2020-06-08 DIAGNOSIS — H43822 Vitreomacular adhesion, left eye: Secondary | ICD-10-CM | POA: Diagnosis not present

## 2020-06-08 DIAGNOSIS — H25813 Combined forms of age-related cataract, bilateral: Secondary | ICD-10-CM | POA: Diagnosis not present

## 2020-06-19 ENCOUNTER — Encounter (INDEPENDENT_AMBULATORY_CARE_PROVIDER_SITE_OTHER): Payer: PPO | Admitting: Ophthalmology

## 2020-06-19 ENCOUNTER — Other Ambulatory Visit: Payer: Self-pay

## 2020-06-19 DIAGNOSIS — H2513 Age-related nuclear cataract, bilateral: Secondary | ICD-10-CM

## 2020-06-19 DIAGNOSIS — H33302 Unspecified retinal break, left eye: Secondary | ICD-10-CM

## 2020-06-19 DIAGNOSIS — H43813 Vitreous degeneration, bilateral: Secondary | ICD-10-CM

## 2020-06-23 DIAGNOSIS — L720 Epidermal cyst: Secondary | ICD-10-CM | POA: Diagnosis not present

## 2020-06-23 DIAGNOSIS — L821 Other seborrheic keratosis: Secondary | ICD-10-CM | POA: Diagnosis not present

## 2020-07-16 DIAGNOSIS — L720 Epidermal cyst: Secondary | ICD-10-CM | POA: Diagnosis not present

## 2020-07-17 ENCOUNTER — Other Ambulatory Visit: Payer: Self-pay | Admitting: Neurology

## 2020-07-22 ENCOUNTER — Other Ambulatory Visit: Payer: Self-pay | Admitting: Neurology

## 2020-07-23 ENCOUNTER — Telehealth: Payer: Self-pay | Admitting: Neurology

## 2020-07-23 MED ORDER — GABAPENTIN 100 MG PO CAPS: 100.0000 mg | ORAL_CAPSULE | Freq: Three times a day (TID) | ORAL | 0 refills | Status: DC

## 2020-07-23 NOTE — Telephone Encounter (Signed)
I spoke to the patient. Not seen since 06/26/19. He has no follow up appts planned. He is going to discuss management of gabapentin w/ his PCP. 90-day supply sent to pharmacy to avoid disruption to his care.

## 2020-07-23 NOTE — Telephone Encounter (Signed)
Pt called, would like a call from the nurse to discuss why gabapentin (NEURONTIN) 100 MG capsule refill has been refused.

## 2020-08-04 DIAGNOSIS — S39012A Strain of muscle, fascia and tendon of lower back, initial encounter: Secondary | ICD-10-CM | POA: Diagnosis not present

## 2020-08-04 DIAGNOSIS — M25551 Pain in right hip: Secondary | ICD-10-CM | POA: Diagnosis not present

## 2020-08-04 DIAGNOSIS — M546 Pain in thoracic spine: Secondary | ICD-10-CM | POA: Diagnosis not present

## 2020-10-20 DIAGNOSIS — C670 Malignant neoplasm of trigone of bladder: Secondary | ICD-10-CM | POA: Diagnosis not present

## 2020-10-20 DIAGNOSIS — R3915 Urgency of urination: Secondary | ICD-10-CM | POA: Diagnosis not present

## 2020-10-20 DIAGNOSIS — N202 Calculus of kidney with calculus of ureter: Secondary | ICD-10-CM | POA: Diagnosis not present

## 2020-12-08 ENCOUNTER — Other Ambulatory Visit: Payer: Self-pay | Admitting: Neurology

## 2020-12-22 DIAGNOSIS — M25511 Pain in right shoulder: Secondary | ICD-10-CM | POA: Diagnosis not present

## 2021-01-04 DIAGNOSIS — S29012D Strain of muscle and tendon of back wall of thorax, subsequent encounter: Secondary | ICD-10-CM | POA: Diagnosis not present

## 2021-03-05 ENCOUNTER — Other Ambulatory Visit: Payer: Self-pay | Admitting: Internal Medicine

## 2021-03-08 DIAGNOSIS — L820 Inflamed seborrheic keratosis: Secondary | ICD-10-CM | POA: Diagnosis not present

## 2021-03-08 DIAGNOSIS — L918 Other hypertrophic disorders of the skin: Secondary | ICD-10-CM | POA: Diagnosis not present

## 2021-03-08 DIAGNOSIS — L309 Dermatitis, unspecified: Secondary | ICD-10-CM | POA: Diagnosis not present

## 2021-03-08 DIAGNOSIS — L2089 Other atopic dermatitis: Secondary | ICD-10-CM | POA: Diagnosis not present

## 2021-03-24 ENCOUNTER — Encounter: Payer: Self-pay | Admitting: Internal Medicine

## 2021-03-24 ENCOUNTER — Other Ambulatory Visit: Payer: Self-pay

## 2021-03-24 ENCOUNTER — Ambulatory Visit (INDEPENDENT_AMBULATORY_CARE_PROVIDER_SITE_OTHER): Payer: PPO | Admitting: Internal Medicine

## 2021-03-24 VITALS — BP 132/80 | HR 59 | Temp 97.0°F | Ht 67.0 in | Wt 222.0 lb

## 2021-03-24 DIAGNOSIS — Z8551 Personal history of malignant neoplasm of bladder: Secondary | ICD-10-CM

## 2021-03-24 DIAGNOSIS — Z0000 Encounter for general adult medical examination without abnormal findings: Secondary | ICD-10-CM | POA: Diagnosis not present

## 2021-03-24 DIAGNOSIS — I10 Essential (primary) hypertension: Secondary | ICD-10-CM

## 2021-03-24 DIAGNOSIS — G629 Polyneuropathy, unspecified: Secondary | ICD-10-CM | POA: Diagnosis not present

## 2021-03-24 DIAGNOSIS — R7301 Impaired fasting glucose: Secondary | ICD-10-CM | POA: Diagnosis not present

## 2021-03-24 DIAGNOSIS — K219 Gastro-esophageal reflux disease without esophagitis: Secondary | ICD-10-CM

## 2021-03-24 LAB — COMPREHENSIVE METABOLIC PANEL
ALT: 25 U/L (ref 0–53)
AST: 23 U/L (ref 0–37)
Albumin: 4.4 g/dL (ref 3.5–5.2)
Alkaline Phosphatase: 104 U/L (ref 39–117)
BUN: 16 mg/dL (ref 6–23)
CO2: 29 mEq/L (ref 19–32)
Calcium: 9.6 mg/dL (ref 8.4–10.5)
Chloride: 105 mEq/L (ref 96–112)
Creatinine, Ser: 1.2 mg/dL (ref 0.40–1.50)
GFR: 60.15 mL/min (ref >60.00)
Glucose, Bld: 107 mg/dL — ABNORMAL HIGH (ref 70–99)
Potassium: 4.7 mEq/L (ref 3.5–5.1)
Sodium: 141 mEq/L (ref 135–145)
Total Bilirubin: 0.7 mg/dL (ref 0.2–1.2)
Total Protein: 7.6 g/dL (ref 6.0–8.3)

## 2021-03-24 LAB — CBC
HCT: 44.2 % (ref 39.0–52.0)
Hemoglobin: 15 g/dL (ref 13.0–17.0)
MCHC: 33.9 g/dL (ref 30.0–36.0)
MCV: 94.1 fl (ref 78.0–100.0)
Platelets: 227 10*3/uL (ref 150.0–400.0)
RBC: 4.7 Mil/uL (ref 4.22–5.81)
RDW: 12.9 % (ref 11.5–15.5)
WBC: 4.8 10*3/uL (ref 4.0–10.5)

## 2021-03-24 LAB — HEMOGLOBIN A1C: Hgb A1c MFr Bld: 6.1 % (ref 4.6–6.5)

## 2021-03-24 MED ORDER — OMEPRAZOLE 20 MG PO CPDR: DELAYED_RELEASE_CAPSULE | ORAL | 3 refills | Status: DC

## 2021-03-24 MED ORDER — GABAPENTIN 100 MG PO CAPS: 100.0000 mg | ORAL_CAPSULE | Freq: Three times a day (TID) | ORAL | 1 refills | Status: DC

## 2021-03-24 NOTE — Assessment & Plan Note (Signed)
Controlled with gabapentin 100 tid

## 2021-03-24 NOTE — Assessment & Plan Note (Signed)
Gets yearly cysto by Dr Tresa Moore

## 2021-03-24 NOTE — Assessment & Plan Note (Signed)
I have personally reviewed the Medicare Annual Wellness questionnaire and have noted 1. The patient's medical and social history 2. Their use of alcohol, tobacco or illicit drugs 3. Their current medications and supplements 4. The patient's functional ability including ADL's, fall risks, home safety risks and hearing or visual             impairment. 5. Diet and physical activities 6. Evidence for depression or mood disorders  The patients weight, height, BMI and visual acuity have been recorded in the chart I have made referrals, counseling and provided education to the patient based review of the above and I have provided the pt with a written personalized care plan for preventive services.  I have provided you with a copy of your personalized plan for preventive services. Please take the time to review along with your updated medication list.  Colon due again 2025 Flu vaccine in the fall Had bivalent COVID vaccine Discussed increasing exercise shingrix at pharmacy No PSA due to age

## 2021-03-24 NOTE — Progress Notes (Signed)
Subjective:    Patient ID: Benjamin Cortez, male    DOB: [birth year], 76 y.o.   MRN: [id number]  HPI Here for Medicare wellness visit and follow up of chronic health conditions Reviewed advanced directives Reviewed other doctors---Dr Armbruster--GI, Dr Darrold Span, Dr Virgil Benedict, [facility], Dr Ree Kida No surgery or hospitalizations this year Vision is okay--some trouble with night driving (may need cataracts done) Hearing aides Not as much exercise lately--troubled by rash on legs (now better with cream from derm) No tobacco Rare glass of wine No depression or anhedonia Did fall once---garage door pushed him off ladder (bruised ribs) Independent with instrumental ADLs No sig memory issues  Ongoing neuropathy---uses the gabapentin tid Toes feel asleep and burn at times No longer seeing neurologist  Still on BP med No chest pain--other than indigestion No SOB No palpitations No syncope. Some dizziness after standing too quick after exercise No edema  Takes prilosec daily Occasional gas/indigestion No dysphagia  Current Outpatient Medications on File Prior to Visit  Medication Sig Dispense Refill   cetirizine (ZYRTEC) 10 MG tablet Take 10 mg by mouth daily. Rotates weekly claritin and zyrtec     fluocinonide cream (LIDEX) 0.05 % APPLY SPARINGLY TO AFFECTED AREA TWICE A DAY     gabapentin (NEURONTIN) 100 MG capsule Take 1 capsule (100 mg total) by mouth 3 (three) times daily. 270 capsule 0   loratadine (CLARITIN) 10 MG tablet Take 10 mg by mouth daily.     losartan-hydrochlorothiazide (HYZAAR) 50-12.5 MG tablet Take 0.5 tablets by mouth daily. 45 tablet 3   Misc Natural Products (GLUCOSAMINE CHOND DOUBLE STR PO) Take by mouth.     Multiple Vitamin (MULTIVITAMIN) tablet Take 1 tablet by mouth daily.     omeprazole (PRILOSEC) 20 MG capsule TAKE 1 CAPSULE BY MOUTH EVERY DAY 90 capsule 3   VITAMIN D PO Take 1 tablet by mouth daily.     No current  facility-administered medications on file prior to visit.    Allergies  Allergen Reactions   Tetracycline     Rash, blisters   Penicillins     As a child--rash   Codeine Nausea Only    Very disoriented    Past Medical History:  Diagnosis Date   Allergic rhinitis    Allergy    seasonal allergies-on meds   Arthritis    generalized   Asthmatic bronchitis 1997   controlled with meds-change in work mold exposure-hx of   Attention deficit disorder    Meds in past--stopped with retirement   Bladder cancer ([place]) 2018   GERD (gastroesophageal reflux disease)    on meds   Hepatitis    "i had hepatitis when i was 28"   History of colonic polyps    [name]   History of kidney stones 1970's   stone basket extraction   Hypertension    on meds   Ocular migraine    Plantar fasciitis 2018   Pneumonia    PONV (postoperative nausea and vomiting)    Vitamin D deficiency     Past Surgical History:  Procedure Laterality Date   ANTERIOR CERVICAL DECOMP/DISCECTOMY FUSION N/A 08/14/2019   Procedure: ANTERIOR CERVICAL DECOMPRESSION FUSION CERVICAL 4-5 WITH INSTRUMENTATION AND ALLOGRAFT;  Surgeon: Phylliss Bob, MD;  Location: [facility];  Service: Orthopedics;  Laterality: N/A;   APPENDECTOMY  1959   CHOLECYSTECTOMY  2005   COLONOSCOPY  2018   SA-MAC-suprep (ade)-polyps   CYSTOSCOPY WITH RETROGRADE PYELOGRAM, URETEROSCOPY AND STENT PLACEMENT Right 09/07/2016  Procedure: CYSTOSCOPY WITH RIGHT RETROGRADE PYELOGRAM;  Surgeon: Alexis Frock, MD;  Location: [facility];  Service: Urology;  Laterality: Right;   KIDNEY STONE SURGERY  1970's   Basket extraction   POLYPECTOMY     TONSILLECTOMY  1959   TRANSURETHRAL RESECTION OF BLADDER TUMOR N/A 09/07/2016   Procedure: TRANSURETHRAL RESECTION OF BLADDER TUMOR (TURBT);  Surgeon: Alexis Frock, MD;  Location[facility];  Service: Urology;  Laterality: N/A;    Family History  Problem Relation Age of Onset   Thyroid  disease Mother    Dementia Mother    Leukemia Father    Colon cancer Neg Hx    Stomach cancer Neg Hx    Esophageal cancer Neg Hx    Rectal cancer Neg Hx    Prostate cancer Neg Hx    Pancreatic cancer Neg Hx     Social History   Socioeconomic History   Marital status: Married    Spouse name: Juliann Pulse   Number of children: 2   Years of education: 12+   Highest education level: Not on file  Occupational History   Occupation: Scientist, clinical (histocompatibility and immunogenetics)- Retired    Comment: retired 2014  Tobacco Use   Smoking status: Former    Packs/day: 1.00    Years: 16.00    Pack years: 16.00    Types: Cigarettes    Quit date: 02/07/1977    Years since quitting: 44.1    Passive exposure: Past   Smokeless tobacco: Never  Vaping Use   Vaping Use: Never used  Substance and Sexual Activity   Alcohol use: Not Currently    Comment: rarely   Drug use: No   Sexual activity: Yes  Other Topics Concern   Not on file  Social History Narrative   Lives with wife   Caffeine use:tea/coffee daily   Right handed   Has living will   Would want wife to make health care decisions-- son Alverda Skeans is alternate   Would accept resuscitation attempts   Not sure about tube feeds   Social Determinants of Health   Financial Resource Strain: Not on file  Food Insecurity: Not on file  Transportation Needs: Not on file  Physical Activity: Not on file  Stress: Not on file  Social Connections: Not on file  Intimate Partner Violence: Not on file   Review of Systems Appetite is fine Weight is stable Sleeps okay Wears seat belt Teeth okay--sees dentist Bowels are fine--no blood Rash for eczema has helped Urinary stream is slow. Empties okay    Objective:   Physical Exam Constitutional:      Appearance: Normal appearance.  HENT:     Mouth/Throat:     Comments: No lesions Eyes:     Conjunctiva/sclera: Conjunctivae normal.     Pupils: Pupils are equal, round, and reactive to light.  Cardiovascular:     Rate and  Rhythm: Normal rate and regular rhythm.     Pulses: Normal pulses.     Heart sounds: No murmur heard.   No gallop.  Pulmonary:     Effort: Pulmonary effort is normal.     Breath sounds: Normal breath sounds. No wheezing or rales.  Abdominal:     Palpations: Abdomen is soft.     Tenderness: There is no abdominal tenderness.  Musculoskeletal:     Cervical back: Neck supple.     Right lower leg: No edema.     Left lower leg: No edema.  Lymphadenopathy:  Cervical: No cervical adenopathy.  Skin:    Findings: No lesion or rash.  Neurological:     General: No focal deficit present.     Mental Status: He is alert and oriented to person, place, and time.     Comments: Mini-cog normal  Psychiatric:        Mood and Affect: Mood normal.        Behavior: Behavior normal.           Assessment & Plan:

## 2021-03-24 NOTE — Assessment & Plan Note (Signed)
Will recheck

## 2021-03-24 NOTE — Assessment & Plan Note (Signed)
Rare indigestion while on the daily omeprazole 20mg 

## 2021-03-24 NOTE — Assessment & Plan Note (Signed)
BP Readings from Last 3 Encounters:  03/24/21 132/80  04/23/20 130/70  03/16/20 138/84   Controlled on losartan/HCTZ 50/12.5---- 1/2 tab daily Will check labs

## 2021-03-24 NOTE — Patient Instructions (Signed)

## 2021-03-24 NOTE — Progress Notes (Signed)
Vision Screening   Right eye Left eye Both eyes  Without correction     With correction 20/20 20/25 2015  Hearing Screening - Comments:: Has hearing aids. Wearing them today.

## 2021-05-09 ENCOUNTER — Other Ambulatory Visit: Payer: Self-pay | Admitting: Internal Medicine

## 2021-05-17 ENCOUNTER — Ambulatory Visit (HOSPITAL_COMMUNITY)
Admission: RE | Admit: 2021-05-17 | Discharge: 2021-05-17 | Disposition: A | Discharge status: Home / Self Care | Payer: PPO | Source: Ambulatory Visit | Attending: Emergency Medicine | Admitting: Emergency Medicine

## 2021-05-17 ENCOUNTER — Encounter (HOSPITAL_COMMUNITY): Payer: Self-pay

## 2021-05-17 VITALS — BP 129/84 | HR 66 | Temp 97.9°F | Resp 20

## 2021-05-17 DIAGNOSIS — J45901 Unspecified asthma with (acute) exacerbation: Secondary | ICD-10-CM | POA: Diagnosis not present

## 2021-05-17 DIAGNOSIS — J4521 Mild intermittent asthma with (acute) exacerbation: Secondary | ICD-10-CM

## 2021-05-17 MED ORDER — ALBUTEROL SULFATE HFA 108 (90 BASE) MCG/ACT IN AERS: 2.0000 | INHALATION_SPRAY | RESPIRATORY_TRACT | 0 refills | Status: DC | PRN

## 2021-05-17 MED ORDER — PREDNISONE 10 MG (21) PO TBPK: ORAL_TABLET | ORAL | 0 refills | Status: DC

## 2021-05-17 MED ORDER — METHYLPREDNISOLONE SODIUM SUCC 125 MG IJ SOLR
60.0000 mg | Freq: Once | INTRAMUSCULAR | Status: AC
  Administered 2021-05-17: 60 mg via INTRAMUSCULAR

## 2021-05-17 MED ORDER — METHYLPREDNISOLONE SODIUM SUCC 125 MG IJ SOLR
INTRAMUSCULAR | Status: AC
Start: 2021-05-17 — End: ?
  Filled 2021-05-17: qty 2

## 2021-05-17 NOTE — ED Triage Notes (Addendum)
Patient c/o productive cough w/ "frothy" sputum x 1 week.  ? ?Patient endorses SOB and chest tightness.  ? ?History of Asthma. History of Pneumonia and Pleuritis.  ? ?Patient has taken Claritin and zyrtec with no relief of symptoms.  ? ?

## 2021-05-17 NOTE — ED Provider Notes (Signed)
?[facility] ? ? ? ?CSN: [id number] ?Arrival date & time: 05/17/21  1420 ? ? ?  ? ?History   ?Chief Complaint ?Chief Complaint  ?Patient presents with  ? Cough  ? APPT 1430  ? ? ?HPI ?Benjamin Cortez is a 76 y.o. male.  ? ?Patient presents with concerns of persistent cough for the past 10 days or so. He reports history of problems with allergies and allergic asthma. Thursday before last he carried a peace lily in the car with him to and from a funeral and he states after that his allergies started flaring up, worsening with the pollen outside. He reports persistent cough, sometimes productive for frothy saliva, nasal congestion, itchy eyes, and sometimes wheezing. He has been taking his Claritin and Zyrtec along with Benadryl at night with mild temporary improvement. He has not used an inhaler and doesn't have one. The patient reports prior similar episodes resolved with steroids but states he hasn't had any episodes since they got his allergy medication regimen dialed in.  ? ?The history is provided by the patient.  ?Cough ?Associated symptoms: wheezing   ?Associated symptoms: no fever, no headaches, no myalgias, no rash and no sore throat   ? ?Past Medical History:  ?Diagnosis Date  ? Allergic rhinitis   ? Allergy   ? seasonal allergies-on meds  ? Arthritis   ? generalized  ? Asthmatic bronchitis 1997  ? controlled with meds-change in work mold exposure-hx of  ? Attention deficit disorder   ? Meds in past--stopped with retirement  ? Bladder cancer ([place]) 2018  ? GERD (gastroesophageal reflux disease)   ? on meds  ? Hepatitis   ? "i had hepatitis when i was 16"  ? History of colonic polyps   ? [name]  ? History of kidney stones 1970's  ? stone basket extraction  ? Hypertension   ? on meds  ? Ocular migraine   ? Plantar fasciitis 2018  ? Pneumonia   ? PONV (postoperative nausea and vomiting)   ? Vitamin D deficiency   ? ? ?Patient Active Problem List  ? Diagnosis Date Noted  ? Lumbar radiculopathy 06/26/2019  ?  Neuropathy 06/26/2019  ? History of bladder cancer 05/19/2017  ? Essential hypertension, benign 09/30/2016  ? Preventative health care 05/15/2015  ? BPH without obstruction/lower urinary tract symptoms 05/15/2015  ? Advance directive discussed with patient 05/15/2015  ? Impaired fasting glucose 05/13/2014  ? COLONIC POLYPS 12/16/2006  ? Allergic rhinitis due to pollen 12/16/2006  ? GERD 12/16/2006  ? RENAL CALCULUS 12/16/2006  ? ? ?Past Surgical History:  ?Procedure Laterality Date  ? ANTERIOR CERVICAL DECOMP/DISCECTOMY FUSION N/A 08/14/2019  ? Procedure: ANTERIOR CERVICAL DECOMPRESSION FUSION CERVICAL 4-5 WITH INSTRUMENTATION AND ALLOGRAFT;  Surgeon: Phylliss Bob, MD;  Location: [facility];  Service: Orthopedics;  Laterality: N/A;  ? APPENDECTOMY  1959  ? CHOLECYSTECTOMY  2005  ? COLONOSCOPY  2018  ? SA-MAC-suprep (ade)-polyps  ? CYSTOSCOPY WITH RETROGRADE PYELOGRAM, URETEROSCOPY AND STENT PLACEMENT Right 09/07/2016  ? Procedure: CYSTOSCOPY WITH RIGHT RETROGRADE PYELOGRAM;  Surgeon: Alexis Frock, MD;  Location[facility];  Service: Urology;  Laterality: Right;  ? KIDNEY STONE SURGERY  1970's  ? Basket extraction  ? POLYPECTOMY    ? TONSILLECTOMY  1959  ? TRANSURETHRAL RESECTION OF BLADDER TUMOR N/A 09/07/2016  ? Procedure: TRANSURETHRAL RESECTION OF BLADDER TUMOR (TURBT);  Surgeon: Alexis Frock, MD;  Location: [facility];  Service: Urology;  Laterality: N/A;  ? ? ? ? ? ?  Home Medications   ? ?Prior to Admission medications   ?Medication Sig Start Date End Date Taking? Authorizing Provider  ?albuterol (VENTOLIN HFA) 108 (90 Base) MCG/ACT inhaler Inhale 2 puffs into the lungs every 4 (four) hours as needed for wheezing or shortness of breath. 05/17/21  Yes Arbutus Nelligan L, PA  ?cetirizine (ZYRTEC) 10 MG tablet Take 10 mg by mouth daily. Rotates weekly claritin and zyrtec   Yes [provider]  ?gabapentin (NEURONTIN) 100 MG capsule Take 1 capsule (100 mg total) by mouth 3 (three)  times daily. 03/24/21  Yes Venia Carbon, MD  ?loratadine (CLARITIN) 10 MG tablet Take 10 mg by mouth daily.   Yes [provider]  ?losartan-hydrochlorothiazide (HYZAAR) 50-12.5 MG tablet TAKE 1/2 TABLET BY MOUTH EVERY DAY 05/10/21  Yes Venia Carbon, MD  ?Misc Natural Products (GLUCOSAMINE CHOND DOUBLE STR PO) Take by mouth.   Yes [provider]  ?Multiple Vitamin (MULTIVITAMIN) tablet Take 1 tablet by mouth daily.   Yes [provider]  ?omeprazole (PRILOSEC) 20 MG capsule TAKE 1 CAPSULE BY MOUTH EVERY DAY 03/24/21  Yes Venia Carbon, MD  ?predniSONE (STERAPRED UNI-PAK 21 TAB) 10 MG (21) TBPK tablet Take as directed 05/17/21  Yes Homero Hyson L, PA  ?fluocinonide cream (LIDEX) 0.05 % APPLY SPARINGLY TO AFFECTED AREA TWICE A DAY 03/17/21   [provider]  ?VITAMIN D PO Take 1 tablet by mouth daily.    [provider]  ? ? ?Family History ?Family History  ?Problem Relation Age of Onset  ? Thyroid disease Mother   ? Dementia Mother   ? Leukemia Father   ? Colon cancer Neg Hx   ? Stomach cancer Neg Hx   ? Esophageal cancer Neg Hx   ? Rectal cancer Neg Hx   ? Prostate cancer Neg Hx   ? Pancreatic cancer Neg Hx   ? ? ?Social History ?Social History  ? ?Tobacco Use  ? Smoking status: Former  ?  Packs/day: 1.00  ?  Years: 16.00  ?  Pack years: 16.00  ?  Types: Cigarettes  ?  Quit date: 02/07/1977  ?  Years since quitting: 44.3  ?  Passive exposure: Past  ? Smokeless tobacco: Never  ?Vaping Use  ? Vaping Use: Never used  ?Substance Use Topics  ? Alcohol use: Not Currently  ?  Comment: rarely  ? Drug use: No  ? ? ? ?Allergies   ?Tetracycline, Penicillins, and Codeine ? ? ?Review of Systems ?Review of Systems  ?Constitutional:  Positive for fatigue. Negative for fever.  ?HENT:  Positive for congestion. Negative for sore throat.   ?Eyes:  Positive for itching.  ?Respiratory:  Positive for cough, chest tightness and wheezing.   ?Gastrointestinal:  Negative for nausea and  vomiting.  ?Musculoskeletal:  Negative for myalgias.  ?Skin:  Negative for rash.  ?Neurological:  Negative for dizziness and headaches.  ? ? ?Physical Exam ?Triage Vital Signs ?ED Triage Vitals  ?Enc Vitals Group  ?   BP 05/17/21 1459 129/84  ?   Pulse Rate 05/17/21 1459 66  ?   Resp 05/17/21 1459 20  ?   Temp 05/17/21 1459 97.9 ?F (36.6 ?C)  ?   Temp Source 05/17/21 1459 Oral  ?   SpO2 05/17/21 1459 97 %  ?   Weight --   ?   Height --   ?   Head Circumference --   ?   Peak Flow --   ?  Pain Score 05/17/21 1456 4  ?   Pain Loc --   ?   Pain Edu? --   ?   Excl. in [place]? --   ? ?No data found. ? ?Updated Vital Signs ?BP 129/84 (BP Location: Left Arm)   Pulse 66   Temp 97.9 ?F (36.6 ?C) (Oral)   Resp 20   SpO2 97%  ? ?Visual Acuity ?Right Eye Distance:   ?Left Eye Distance:   ?Bilateral Distance:   ? ?Right Eye Near:   ?Left Eye Near:    ?Bilateral Near:    ? ?Physical Exam ?Vitals and nursing note reviewed.  ?Constitutional:   ?   General: He is not in acute distress. ?HENT:  ?   Head: Normocephalic.  ?   Nose: Nose normal.  ?   Mouth/Throat:  ?   Mouth: Mucous membranes are moist.  ?   Pharynx: Oropharynx is clear. No oropharyngeal exudate or posterior oropharyngeal erythema.  ?Eyes:  ?   Conjunctiva/sclera: Conjunctivae normal.  ?   Pupils: Pupils are equal, round, and reactive to light.  ?Cardiovascular:  ?   Rate and Rhythm: Normal rate and regular rhythm.  ?   Heart sounds: Normal heart sounds.  ?Pulmonary:  ?   Effort: Pulmonary effort is normal. No respiratory distress.  ?   Breath sounds: Wheezing (very mild bilateral) present. No rhonchi or rales.  ?Lymphadenopathy:  ?   Cervical: No cervical adenopathy.  ?Skin: ?   Findings: No rash.  ?Neurological:  ?   Mental Status: He is alert.  ?   Gait: Gait normal.  ?Psychiatric:     ?   Mood and Affect: Mood normal.  ? ? ? ?UC Treatments / Results  ?Labs ?(all labs ordered are listed, but only abnormal results are displayed) ?Labs Reviewed - No data to  display ? ?EKG ? ? ?Radiology ?No results found. ? ?Procedures ?Procedures (including critical care time) ? ?Medications Ordered in UC ?Medications  ?methylPREDNISolone sodium succinate (SOLU-MEDROL) 125 mg/2 mL injection 60

## 2021-05-17 NOTE — Discharge Instructions (Addendum)
Steroid injection given today. Take prednisone as prescribed starting tomorrow morning and use inhaler as needed. Follow-up with PCP if minimal improvement. GO to the ER if develop difficulty breathing or other worsening/severe symptoms.  ?

## 2021-05-28 DIAGNOSIS — H35373 Puckering of macula, bilateral: Secondary | ICD-10-CM | POA: Diagnosis not present

## 2021-05-28 DIAGNOSIS — H43813 Vitreous degeneration, bilateral: Secondary | ICD-10-CM | POA: Diagnosis not present

## 2021-05-28 DIAGNOSIS — H25813 Combined forms of age-related cataract, bilateral: Secondary | ICD-10-CM | POA: Diagnosis not present

## 2021-06-30 DIAGNOSIS — H43813 Vitreous degeneration, bilateral: Secondary | ICD-10-CM | POA: Diagnosis not present

## 2021-06-30 DIAGNOSIS — H31092 Other chorioretinal scars, left eye: Secondary | ICD-10-CM | POA: Diagnosis not present

## 2021-06-30 DIAGNOSIS — H25813 Combined forms of age-related cataract, bilateral: Secondary | ICD-10-CM | POA: Diagnosis not present

## 2021-06-30 DIAGNOSIS — H35372 Puckering of macula, left eye: Secondary | ICD-10-CM | POA: Diagnosis not present

## 2021-07-15 DIAGNOSIS — H25812 Combined forms of age-related cataract, left eye: Secondary | ICD-10-CM | POA: Diagnosis not present

## 2021-07-15 DIAGNOSIS — H25813 Combined forms of age-related cataract, bilateral: Secondary | ICD-10-CM | POA: Diagnosis not present

## 2021-07-27 DIAGNOSIS — H25812 Combined forms of age-related cataract, left eye: Secondary | ICD-10-CM | POA: Diagnosis not present

## 2021-07-27 DIAGNOSIS — H269 Unspecified cataract: Secondary | ICD-10-CM | POA: Diagnosis not present

## 2021-09-06 IMAGING — CT CT BIOPSY
3 of 5 series · 14 of 32 positions shown, 19 images · non-contrast
Comparison: none

CLINICAL DATA: Left buttock, hip, and groin pain. Suspected
sacroiliac joint dysfunction.

[Series 2: needle -guided injection · axial · 0.87mm/px · z∈[-110,-38]mm · 7 of 50 slices shown, 12 images (1 of 3)]
[im 7/50  soft-tissue]
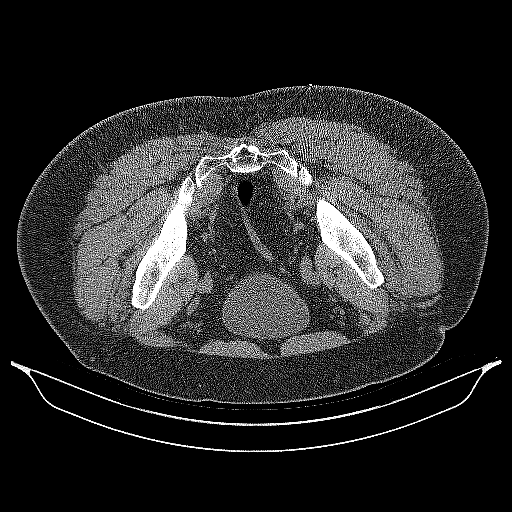
[im 7/50  bone]
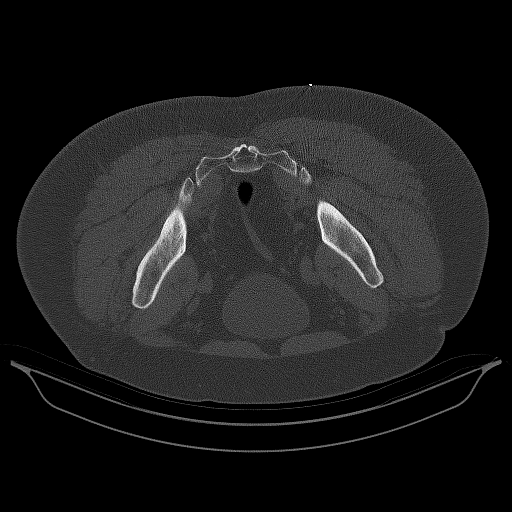
[im 13/50  soft-tissue]
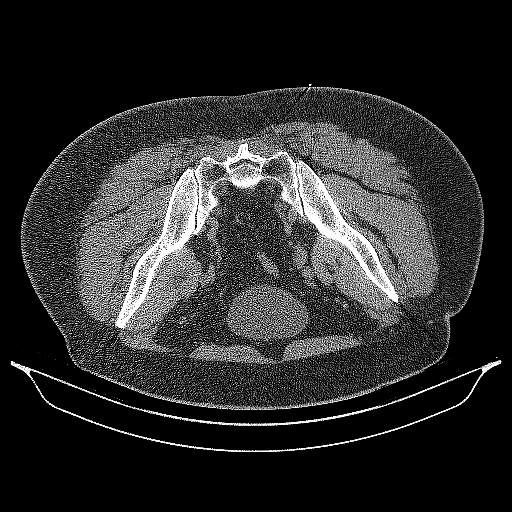
[im 19/50  soft-tissue]
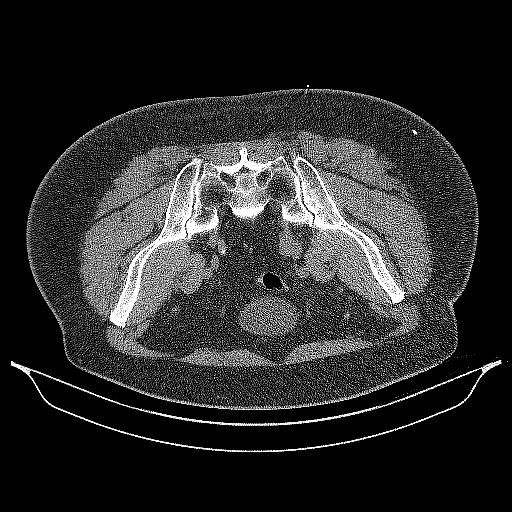
[im 25/50  soft-tissue]
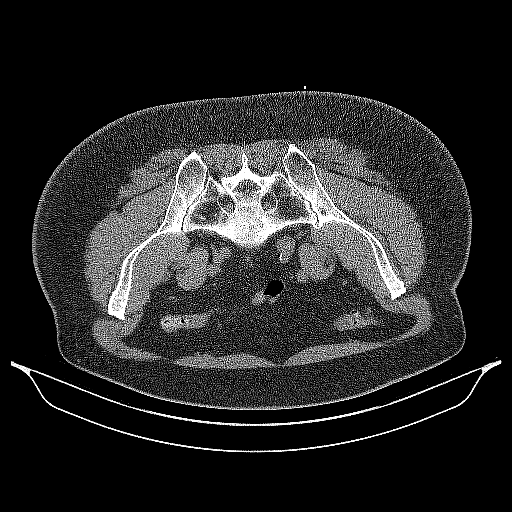
[im 25/50  lung]
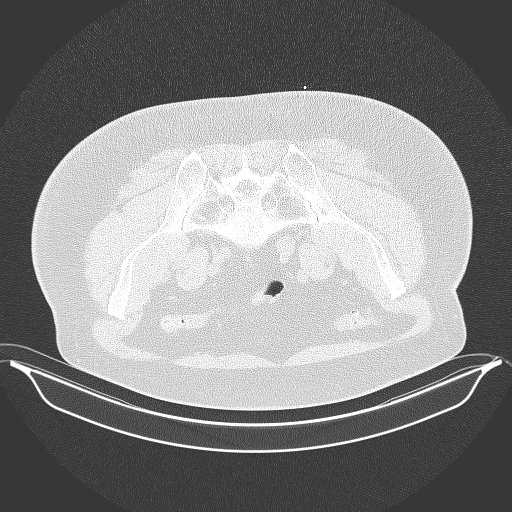
[im 31/50  soft-tissue]
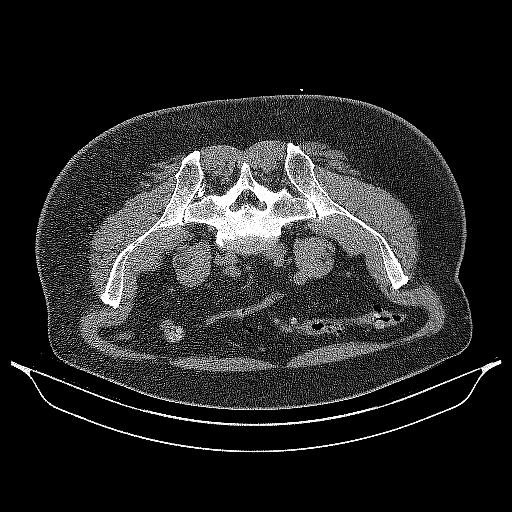
[im 31/50  lung]
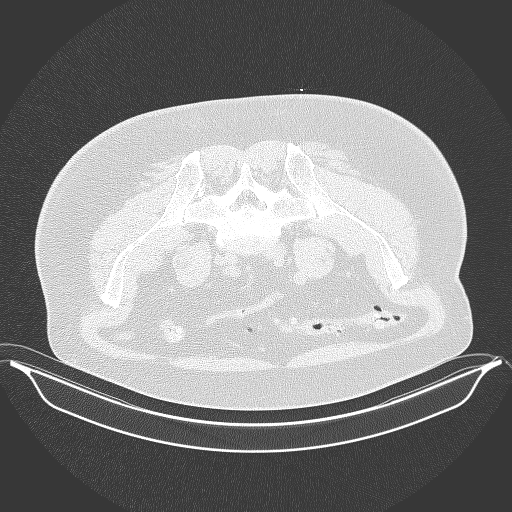
[im 37/50  soft-tissue]
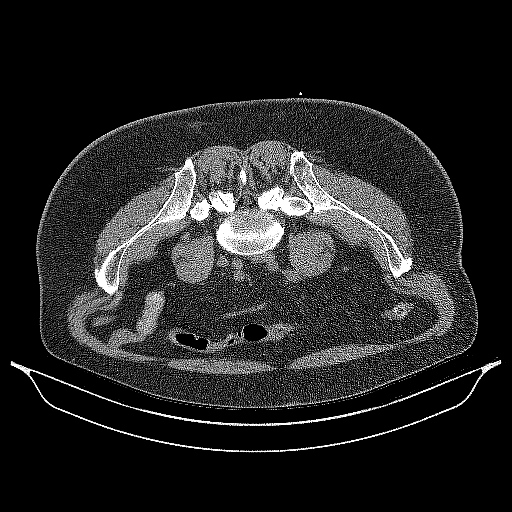
[im 37/50  lung]
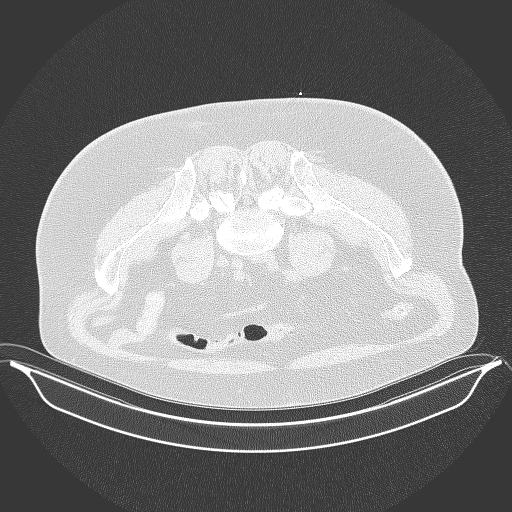
[im 43/50  soft-tissue]
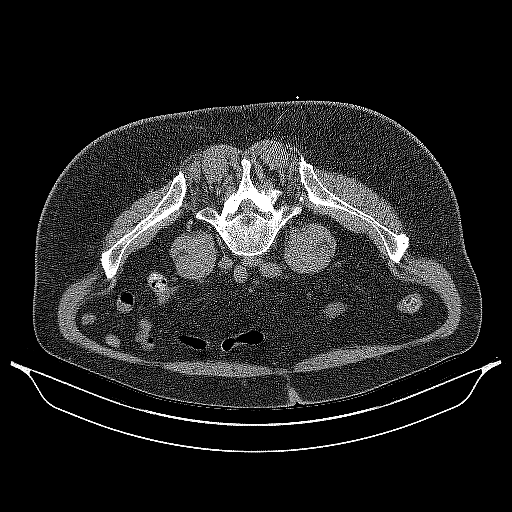
[im 43/50  lung]
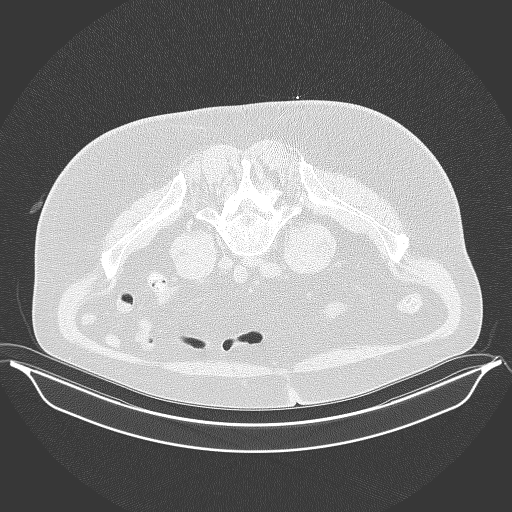

[Series 4: needle -guided injection · axial · 0.87mm/px · z∈[-114,-84]mm · 4 of 27 slices shown (2 of 3)]
[im 6/27  soft-tissue]
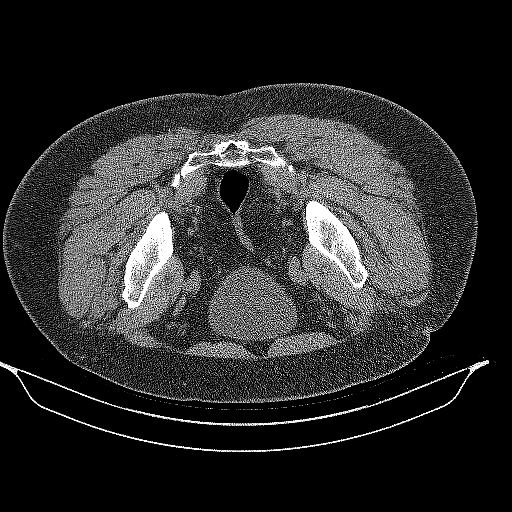
[im 11/27  soft-tissue]
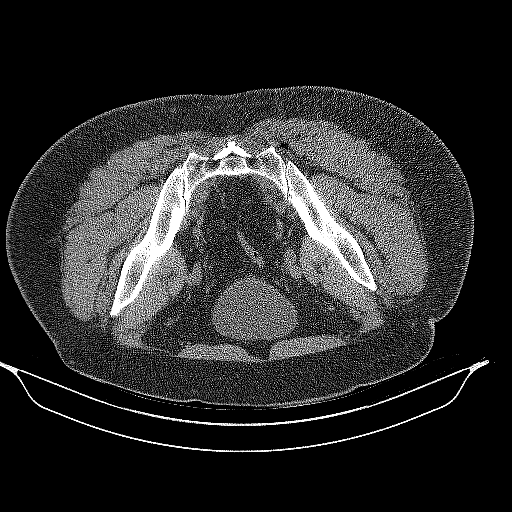
[im 16/27  soft-tissue]
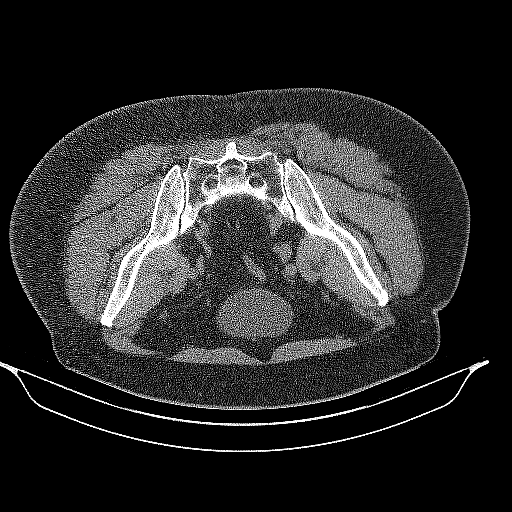
[im 21/27  soft-tissue]
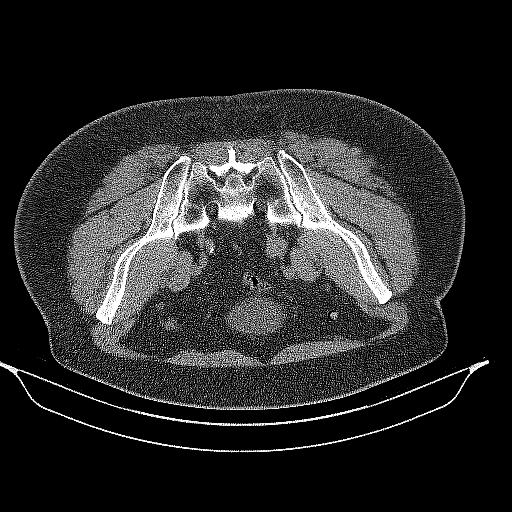

[Series 5: needle -guided injection · axial · 0.87mm/px · z∈[-114,-94]mm · 3 of 27 slices shown (3 of 3)]
[im 6/27  soft-tissue]
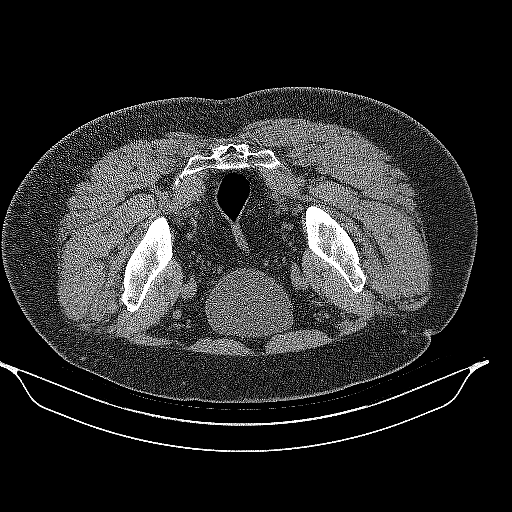
[im 11/27  soft-tissue]
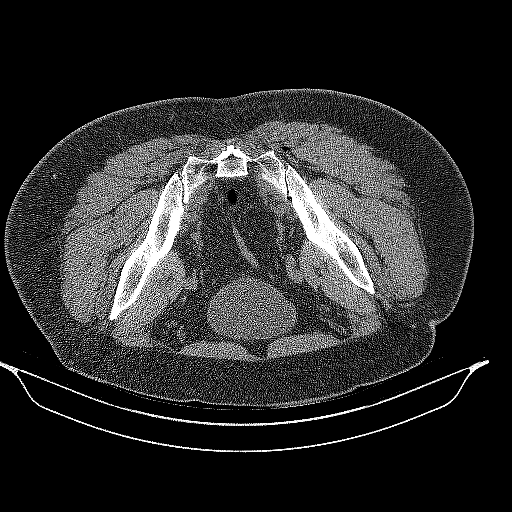
[im 16/27  soft-tissue]
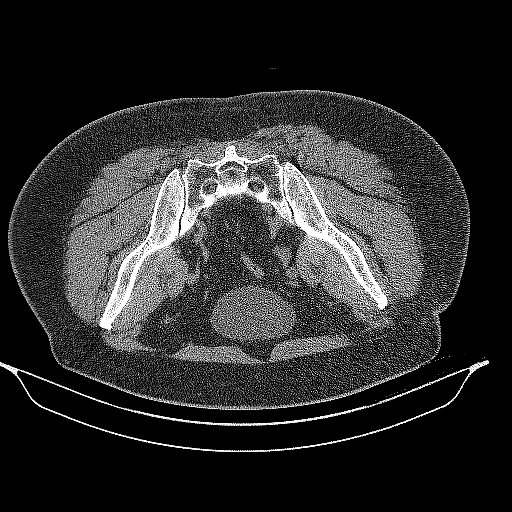

[14 of 32 positions shown; findings below may reference images not displayed]

EXAM:
CT-GUIDED LEFT SACROILIAC JOINT INJECTION

PROCEDURE:
After a thorough discussion of risks and benefits of the procedure,
including bleeding, infection, injury to nerves, blood vessels, and
adjacent structures, verbal and written consent was obtained. The
patient was placed prone on the CT table and localization was
performed over the sacrum. Target site was marked using CT guidance.
The skin was prepped and draped in the usual sterile fashion using
Betadine soap.

After local anesthesia with 1% lidocaine without epinephrine and
subsequent deep anesthesia, a 3.5 inch 22 gauge spinal needle was
advanced into the left SI joint. Injection of 0.5 ml Isovue-M 200
confirmed intra-articular placement. No vascular uptake present.
Subsequently, 2 mL of 0.25% bupivacaine were injected into the left
SI joint. Needle was removed and a sterile dressing applied.

No complications were observed. The patient was observed and
released under the care of a driver after 30 minutes.
IMPRESSION: Successful CT guided left SI joint injection with anesthetic only.

## 2021-09-07 IMAGING — RF DG C-ARM 1-60 MIN
1 series · 1 of 1 positions shown · non-contrast
Comparison: None.

CLINICAL DATA: C4-5 ACDF.

EXAM:
CERVICAL SPINE - 2-3 VIEW; DG C-ARM 1-60 MIN

[Series 1: run · 1 of 1 slices shown]
[im 1/1]
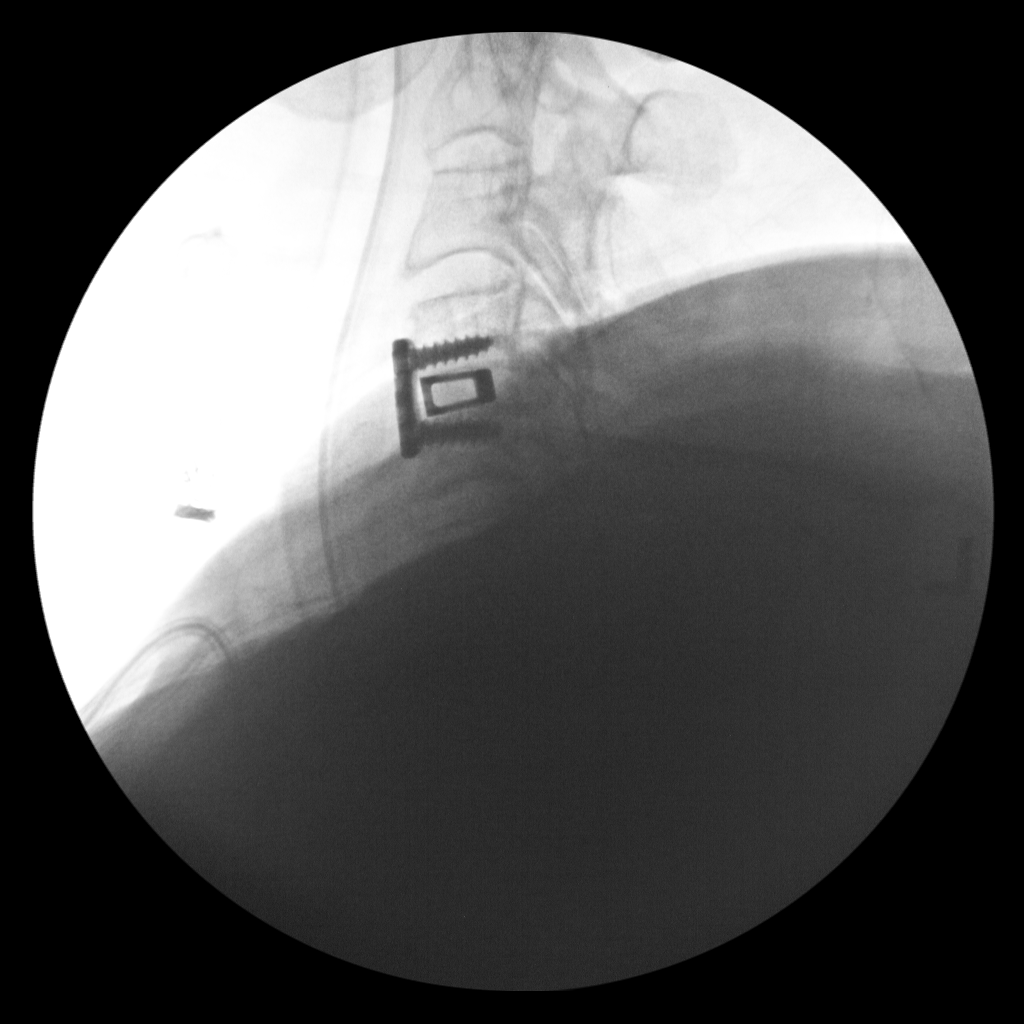

[1 of 1 positions shown; findings below may reference images not displayed]

FINDINGS: Single lateral fluoroscopic spot view obtained in the operating
room. Anterior fusion C4-C5 with interbody spacer, numbering based
on a included uppermost cervical vertebra being C2. Fluoroscopy time
13 seconds. Dose 2.14 mGy.
IMPRESSION: Procedural fluoroscopy after C4-C5 anterior fusion.

## 2021-09-14 ENCOUNTER — Ambulatory Visit (INDEPENDENT_AMBULATORY_CARE_PROVIDER_SITE_OTHER): Payer: PPO | Admitting: Family Medicine

## 2021-09-14 ENCOUNTER — Encounter: Payer: Self-pay | Admitting: Family Medicine

## 2021-09-14 VITALS — BP 122/60 | HR 65 | Temp 96.9°F | Ht 67.0 in | Wt 226.0 lb

## 2021-09-14 DIAGNOSIS — H6692 Otitis media, unspecified, left ear: Secondary | ICD-10-CM | POA: Insufficient documentation

## 2021-09-14 MED ORDER — CEFDINIR 300 MG PO CAPS
600.0000 mg | ORAL_CAPSULE | Freq: Every day | ORAL | 0 refills | Status: AC
Start: 2021-09-14 — End: 2021-09-21

## 2021-09-14 NOTE — Patient Instructions (Addendum)
Start antibiotics  Return next week if not improving  Continue allergy treatment

## 2021-09-14 NOTE — Assessment & Plan Note (Signed)
He does have what looks like purulent fluid behind the left ear, will treat with antibiotics.  He also has symptoms concerning for possible TMJ discussed anti-inflammatories for this.  Follow-up if not improving.

## 2021-09-14 NOTE — Progress Notes (Signed)
Subjective:     Benjamin Cortez is a 76 y.o. male presenting for Acute Visit (Left Ear ache x 1-2 weeks; painful when jaw moves and gets a bitter taste in mouth when its dry. Had ear check yesterday when getting hearing aides checked and they said ear was fine. )     Otalgia  There is pain in the left ear. This is a new problem. The current episode started 1 to 4 weeks ago. The problem occurs constantly. The problem has been gradually worsening. There has been no fever. Associated symptoms include headaches, rhinorrhea and a sore throat. Pertinent negatives include no coughing, ear discharge or hearing loss. He has tried ear drops (nasal spray) for the symptoms. The treatment provided no relief.   Recent audiologist visit with good outcome   Ibuprofen, tylenol or asa daily - typically when pain  Will get occasional bitter taste in the mouth   Review of Systems  HENT:  Positive for ear pain, rhinorrhea and sore throat. Negative for ear discharge and hearing loss.   Respiratory:  Negative for cough.   Neurological:  Positive for headaches.     Social History   Tobacco Use  Smoking Status Former   Packs/day: 1.00   Years: 16.00   Total pack years: 16.00   Types: Cigarettes   Quit date: 02/07/1977   Years since quitting: 44.6   Passive exposure: Past  Smokeless Tobacco Never        Objective:    BP Readings from Last 3 Encounters:  09/14/21 122/60  05/17/21 129/84  03/24/21 132/80   Wt Readings from Last 3 Encounters:  09/14/21 226 lb (102.5 kg)  03/24/21 222 lb (100.7 kg)  04/23/20 219 lb (99.3 kg)    BP 122/60 (BP Location: Left Arm, Patient Position: Sitting, Cuff Size: Normal)   Pulse 65   Temp (!) 96.9 F (36.1 C) (Temporal)   Ht '5\' 7"'$  (1.702 m)   Wt 226 lb (102.5 kg)   SpO2 98%   BMI 35.40 kg/m    Physical Exam Constitutional:      General: He is not in acute distress.    Appearance: He is well-developed. He is not ill-appearing.  HENT:     Head:  Normocephalic and atraumatic.     Right Ear: Tympanic membrane and ear canal normal.     Left Ear: Ear canal normal. Tenderness present. A middle ear effusion is present. There is mastoid tenderness.     Nose: Mucosal edema and rhinorrhea present.     Right Sinus: No maxillary sinus tenderness or frontal sinus tenderness.     Left Sinus: No maxillary sinus tenderness or frontal sinus tenderness.     Mouth/Throat:     Pharynx: Uvula midline. Posterior oropharyngeal erythema present. No oropharyngeal exudate.     Tonsils: 0 on the right. 0 on the left.  Cardiovascular:     Rate and Rhythm: Normal rate and regular rhythm.     Heart sounds: No murmur heard. Pulmonary:     Effort: Pulmonary effort is normal. No respiratory distress.     Breath sounds: Normal breath sounds.  Musculoskeletal:     Cervical back: Neck supple.  Lymphadenopathy:     Cervical: No cervical adenopathy.  Skin:    General: Skin is warm and dry.     Capillary Refill: Capillary refill takes less than 2 seconds.  Neurological:     Mental Status: He is alert.  Assessment & Plan:   Problem List Items Addressed This Visit       Nervous and Auditory   Acute left otitis media - Primary    He does have what looks like purulent fluid behind the left ear, will treat with antibiotics.  He also has symptoms concerning for possible TMJ discussed anti-inflammatories for this.  Follow-up if not improving.      Relevant Medications   cefdinir (OMNICEF) 300 MG capsule     Return if symptoms worsen or fail to improve.  Lesleigh Noe, MD

## 2021-10-04 ENCOUNTER — Other Ambulatory Visit: Payer: Self-pay | Admitting: Internal Medicine

## 2021-10-19 DIAGNOSIS — C670 Malignant neoplasm of trigone of bladder: Secondary | ICD-10-CM | POA: Diagnosis not present

## 2021-10-19 DIAGNOSIS — R3915 Urgency of urination: Secondary | ICD-10-CM | POA: Diagnosis not present

## 2022-03-25 ENCOUNTER — Encounter: Payer: Self-pay | Admitting: Internal Medicine

## 2022-03-25 ENCOUNTER — Ambulatory Visit (INDEPENDENT_AMBULATORY_CARE_PROVIDER_SITE_OTHER): Payer: PPO | Admitting: Internal Medicine

## 2022-03-25 VITALS — BP 124/72 | HR 50 | Temp 96.8°F | Ht 67.0 in | Wt 225.0 lb

## 2022-03-25 DIAGNOSIS — I10 Essential (primary) hypertension: Secondary | ICD-10-CM | POA: Diagnosis not present

## 2022-03-25 DIAGNOSIS — G629 Polyneuropathy, unspecified: Secondary | ICD-10-CM | POA: Diagnosis not present

## 2022-03-25 DIAGNOSIS — K2100 Gastro-esophageal reflux disease with esophagitis, without bleeding: Secondary | ICD-10-CM

## 2022-03-25 DIAGNOSIS — Z8551 Personal history of malignant neoplasm of bladder: Secondary | ICD-10-CM | POA: Diagnosis not present

## 2022-03-25 DIAGNOSIS — Z0000 Encounter for general adult medical examination without abnormal findings: Secondary | ICD-10-CM

## 2022-03-25 DIAGNOSIS — R7303 Prediabetes: Secondary | ICD-10-CM | POA: Diagnosis not present

## 2022-03-25 LAB — COMPREHENSIVE METABOLIC PANEL
ALT: 29 U/L (ref 0–53)
AST: 22 U/L (ref 0–37)
Albumin: 4.1 g/dL (ref 3.5–5.2)
Alkaline Phosphatase: 85 U/L (ref 39–117)
BUN: 17 mg/dL (ref 6–23)
CO2: 30 mEq/L (ref 19–32)
Calcium: 9.3 mg/dL (ref 8.4–10.5)
Chloride: 105 mEq/L (ref 96–112)
Creatinine, Ser: 1.19 mg/dL (ref 0.40–1.50)
GFR: 60.33 mL/min (ref >60.00)
Glucose, Bld: 104 mg/dL — ABNORMAL HIGH (ref 70–99)
Potassium: 4.5 mEq/L (ref 3.5–5.1)
Sodium: 140 mEq/L (ref 135–145)
Total Bilirubin: 0.8 mg/dL (ref 0.2–1.2)
Total Protein: 6.7 g/dL (ref 6.0–8.3)

## 2022-03-25 LAB — CBC
HCT: 43.4 % (ref 39.0–52.0)
Hemoglobin: 15 g/dL (ref 13.0–17.0)
MCHC: 34.6 g/dL (ref 30.0–36.0)
MCV: 94.1 fl (ref 78.0–100.0)
Platelets: 231 10*3/uL (ref 150.0–400.0)
RBC: 4.61 Mil/uL (ref 4.22–5.81)
RDW: 12.9 % (ref 11.5–15.5)
WBC: 5 10*3/uL (ref 4.0–10.5)

## 2022-03-25 LAB — HEMOGLOBIN A1C: Hgb A1c MFr Bld: 6.2 % (ref 4.6–6.5)

## 2022-03-25 LAB — LIPID PANEL
Cholesterol: 158 mg/dL (ref 0–200)
HDL: 48.2 mg/dL (ref >39.00)
LDL Cholesterol: 94 mg/dL (ref 0–99)
NonHDL: 109.45
Total CHOL/HDL Ratio: 3
Triglycerides: 76 mg/dL (ref 0.0–149.0)
VLDL: 15.2 mg/dL (ref 0.0–40.0)

## 2022-03-25 MED ORDER — GABAPENTIN 100 MG PO CAPS: ORAL_CAPSULE | ORAL | 3 refills | Status: DC

## 2022-03-25 MED ORDER — OMEPRAZOLE 20 MG PO CPDR: DELAYED_RELEASE_CAPSULE | ORAL | 3 refills | Status: DC

## 2022-03-25 NOTE — Assessment & Plan Note (Signed)
I have personally reviewed the Medicare Annual Wellness questionnaire and have noted 1. The patient's medical and social history 2. Their use of alcohol, tobacco or illicit drugs 3. Their current medications and supplements 4. The patient's functional ability including ADL's, fall risks, home safety risks and hearing or visual             impairment. 5. Diet and physical activities 6. Evidence for depression or mood disorders  The patients weight, height, BMI and visual acuity have been recorded in the chart I have made referrals, counseling and provided education to the patient based review of the above and I have provided the pt with a written personalized care plan for preventive services.  I have provided you with a copy of your personalized plan for preventive services. Please take the time to review along with your updated medication list.  Colon due next year No PSA due to age Flu, COVID, RSV in the fall Consider shingrix at pharmacy Needs to get back to exercise and avoid sweets

## 2022-03-25 NOTE — Assessment & Plan Note (Signed)
Still gets yearly cystoscopy

## 2022-03-25 NOTE — Assessment & Plan Note (Signed)
Discussed exercise and cutting out sugar Will check labs Consider metformin if worse

## 2022-03-25 NOTE — Assessment & Plan Note (Signed)
Has had some increased gas symptoms--but no dysphagia Can try bid omeprazole briefly GI if worsens

## 2022-03-25 NOTE — Assessment & Plan Note (Signed)
Does okay with gabapentin 100 tid

## 2022-03-25 NOTE — Progress Notes (Signed)
Hearing Screening - Comments:: Has hearing aids. Wearing them today. Vision Screening - Comments:: November 2023

## 2022-03-25 NOTE — Assessment & Plan Note (Signed)
BP Readings from Last 3 Encounters:  03/25/22 124/72  09/14/21 122/60  05/17/21 129/84   Controlled on losartan/HCTZ 50/12.5

## 2022-03-25 NOTE — Progress Notes (Signed)
Subjective:    Patient ID: Benjamin Cortez, male    DOB: Jun 11, 1945, 77 y.o.   MRN: [id number]  HPI Here for Medicare wellness visit and follow up of chronic health conditions Reviewed advanced directives Reviewed other doctors----Dr Armbruster--GI, Dr Katina Dung, Dr Darrold Span, Dr Mottinger--dentist, Dr Manny--urology No hospitalizations this year. Had left cataract removed this year Inconsistent with exercise--gym closed. Busy with grandkids, etc--plans to restart Vision is okay Hearing aides help No falls No depression or anhedonia Independent with instrumental ADLs No sig memory issues  Got bout of asthmatic bronchitis last May--after close exposure to flowers for extended time Pollen exposure--went to urgent care----got steroids and it cleared up Has albuterol inhaler for prn also But gained some weight and hasn't been able to lose them again Does usually take loratadine and cetirizine alternating  Discussed healthy eating Has had elevated sugars  Will get painful bubble in chest at times---can't get the burp up at times Does go away with belch Omeprazole daily No dysphagia  No chest pain  No SOB No dizziness or syncope No headaches No edema Wil get momentary sensation of heart--no racing though  Continues on gabapentin--keeps the neuropathy "at [place]"  Voids okay No nocturia Flow okay--empties okay Still gets yearly cystoscopy  Current Outpatient Medications on File Prior to Visit  Medication Sig Dispense Refill   albuterol (VENTOLIN HFA) 108 (90 Base) MCG/ACT inhaler Inhale 2 puffs into the lungs every 6 (six) hours as needed for wheezing or shortness of breath.     fluocinonide cream (LIDEX) 0.05 % APPLY SPARINGLY TO AFFECTED AREA TWICE A DAY     gabapentin (NEURONTIN) 100 MG capsule TAKE 1 CAPSULE (100 MG TOTAL) BY MOUTH THREE TIMES DAILY. 270 capsule 1   losartan-hydrochlorothiazide (HYZAAR) 50-12.5 MG tablet TAKE 1/2 TABLET BY MOUTH EVERY DAY 45 tablet 3    Misc Natural Products (GLUCOSAMINE CHOND DOUBLE STR PO) Take by mouth.     Multiple Vitamin (MULTIVITAMIN) tablet Take 1 tablet by mouth daily.     omeprazole (PRILOSEC) 20 MG capsule TAKE 1 CAPSULE BY MOUTH EVERY DAY 90 capsule 3   No current facility-administered medications on file prior to visit.    Allergies  Allergen Reactions   Tetracycline     Rash, blisters   Penicillins     As a child--rash   Codeine Nausea Only    Very disoriented    Past Medical History:  Diagnosis Date   Allergic rhinitis    Allergy    seasonal allergies-on meds   Arthritis    generalized   Asthmatic bronchitis 1997   controlled with meds-change in work mold exposure-hx of   Attention deficit disorder    Meds in past--stopped with retirement   Bladder cancer ([place]) 2018   GERD (gastroesophageal reflux disease)    on meds   Hepatitis    "i had hepatitis when i was 52"   History of colonic polyps    [name]   History of kidney stones 1970's   stone basket extraction   Hypertension    on meds   Ocular migraine    Plantar fasciitis 2018   Pneumonia    PONV (postoperative nausea and vomiting)    Vitamin D deficiency     Past Surgical History:  Procedure Laterality Date   ANTERIOR CERVICAL DECOMP/DISCECTOMY FUSION N/A 08/14/2019   Procedure: ANTERIOR CERVICAL DECOMPRESSION FUSION CERVICAL 4-5 WITH INSTRUMENTATION AND ALLOGRAFT;  Surgeon: Phylliss Bob, MD;  Location: [facility];  Service: Orthopedics;  Laterality: N/A;  APPENDECTOMY  1959   CHOLECYSTECTOMY  2005   COLONOSCOPY  2018   SA-MAC-suprep (ade)-polyps   CYSTOSCOPY WITH RETROGRADE PYELOGRAM, URETEROSCOPY AND STENT PLACEMENT Right 09/07/2016   Procedure: CYSTOSCOPY WITH RIGHT RETROGRADE PYELOGRAM;  Surgeon: Alexis Frock, MD;  Location[facility];  Service: Urology;  Laterality: Right;   KIDNEY STONE SURGERY  1970's   Basket extraction   POLYPECTOMY     TONSILLECTOMY  1959   TRANSURETHRAL RESECTION OF BLADDER  TUMOR N/A 09/07/2016   Procedure: TRANSURETHRAL RESECTION OF BLADDER TUMOR (TURBT);  Surgeon: Alexis Frock, MD;  Location: [facility];  Service: Urology;  Laterality: N/A;    Family History  Problem Relation Age of Onset   Thyroid disease Mother    Dementia Mother    Leukemia Father    Colon cancer Neg Hx    Stomach cancer Neg Hx    Esophageal cancer Neg Hx    Rectal cancer Neg Hx    Prostate cancer Neg Hx    Pancreatic cancer Neg Hx     Social History   Socioeconomic History   Marital status: Married    Spouse name: Juliann Pulse   Number of children: 2   Years of education: 12+   Highest education level: Not on file  Occupational History   Occupation: Scientist, clinical (histocompatibility and immunogenetics)- Retired    Comment: retired 2014  Tobacco Use   Smoking status: Former    Packs/day: 1.00    Years: 16.00    Total pack years: 16.00    Types: Cigarettes    Quit date: 02/07/1977    Years since quitting: 45.1    Passive exposure: Past   Smokeless tobacco: Never  Vaping Use   Vaping Use: Never used  Substance and Sexual Activity   Alcohol use: Not Currently    Comment: rarely   Drug use: No   Sexual activity: Yes  Other Topics Concern   Not on file  Social History Narrative   Lives with wife   Caffeine use:tea/coffee daily   Right handed   Has living will   Would want wife to make health care decisions-- son Alverda Skeans is alternate   Would accept resuscitation attempts   Not sure about tube feeds   Social Determinants of Health   Financial Resource Strain: Not on file  Food Insecurity: Not on file  Transportation Needs: Not on file  Physical Activity: Not on file  Stress: Not on file  Social Connections: Not on file  Intimate Partner Violence: Not on file   Review of Systems Sleeps okay Eczema cleared up now--no suspicious skin lesions now Having trouble with molar--going to dentist Bowels fine--- occ blood on toilet paper from hemorrhoid No sig back or joint pans--slight left  hip and S-I issues (stretching helps)     Objective:   Physical Exam Constitutional:      Appearance: Normal appearance.  HENT:     Mouth/Throat:     Pharynx: No oropharyngeal exudate or posterior oropharyngeal erythema.  Eyes:     Conjunctiva/sclera: Conjunctivae normal.     Pupils: Pupils are equal, round, and reactive to light.  Cardiovascular:     Rate and Rhythm: Normal rate and regular rhythm.     Pulses: Normal pulses.     Heart sounds:     No gallop.  Pulmonary:     Effort: Pulmonary effort is normal.     Breath sounds: Normal breath sounds. No wheezing or rales.  Abdominal:     Palpations:  Abdomen is soft.     Tenderness: There is no abdominal tenderness.  Musculoskeletal:     Cervical back: Neck supple.     Right lower leg: No edema.     Left lower leg: No edema.  Lymphadenopathy:     Cervical: No cervical adenopathy.  Skin:    Findings: No lesion or rash.  Neurological:     General: No focal deficit present.     Mental Status: He is alert and oriented to person, place, and time.     Comments: Word naming 15/40 seconds Recall 3/3  Psychiatric:        Mood and Affect: Mood normal.        Behavior: Behavior normal.            Assessment & Plan:

## 2022-04-25 DIAGNOSIS — H25811 Combined forms of age-related cataract, right eye: Secondary | ICD-10-CM | POA: Diagnosis not present

## 2022-04-25 DIAGNOSIS — H31092 Other chorioretinal scars, left eye: Secondary | ICD-10-CM | POA: Diagnosis not present

## 2022-04-25 DIAGNOSIS — H5319 Other subjective visual disturbances: Secondary | ICD-10-CM | POA: Diagnosis not present

## 2022-04-25 DIAGNOSIS — H35372 Puckering of macula, left eye: Secondary | ICD-10-CM | POA: Diagnosis not present

## 2022-04-25 DIAGNOSIS — H43813 Vitreous degeneration, bilateral: Secondary | ICD-10-CM | POA: Diagnosis not present

## 2022-05-08 ENCOUNTER — Other Ambulatory Visit: Payer: Self-pay | Admitting: Internal Medicine

## 2022-05-24 ENCOUNTER — Encounter: Payer: Self-pay | Admitting: Internal Medicine

## 2022-06-20 ENCOUNTER — Ambulatory Visit (INDEPENDENT_AMBULATORY_CARE_PROVIDER_SITE_OTHER): Payer: PPO | Admitting: Internal Medicine

## 2022-06-20 ENCOUNTER — Encounter: Payer: Self-pay | Admitting: Internal Medicine

## 2022-06-20 VITALS — BP 148/78 | HR 55 | Temp 97.9°F | Ht 67.0 in | Wt 224.0 lb

## 2022-06-20 DIAGNOSIS — G629 Polyneuropathy, unspecified: Secondary | ICD-10-CM

## 2022-06-20 NOTE — Progress Notes (Signed)
Subjective:    Patient ID: Benjamin Cortez, male    DOB: [birth year], 77 y.o.   MRN: [id number]  HPI Here due to right great toe pain  Right great toe gives him stabbing pain--on bottom into the foot At random--even just sitting "Like someone jabbed a nail into it" Some pain in other toes as well Has notice some pain in left foot as well  Not sure if this is the same neuropathy Is using the neuropathy  Using the gabapentin 100/200--this does seem to help Uses ibuprofen 400mg  at times at night as well  Current Outpatient Medications on File Prior to Visit  Medication Sig Dispense Refill   albuterol (VENTOLIN HFA) 108 (90 Base) MCG/ACT inhaler Inhale 2 puffs into the lungs every 6 (six) hours as needed for wheezing or shortness of breath.     fluocinonide cream (LIDEX) 0.05 % APPLY SPARINGLY TO AFFECTED AREA TWICE A DAY     gabapentin (NEURONTIN) 100 MG capsule TAKE 1 CAPSULE (100 MG TOTAL) BY MOUTH THREE TIMES DAILY. 270 capsule 3   losartan-hydrochlorothiazide (HYZAAR) 50-12.5 MG tablet TAKE 1/2 TABLET BY MOUTH EVERY DAY 45 tablet 3   Misc Natural Products (GLUCOSAMINE CHOND DOUBLE STR PO) Take by mouth.     Multiple Vitamin (MULTIVITAMIN) tablet Take 1 tablet by mouth daily.     omeprazole (PRILOSEC) 20 MG capsule TAKE 1 CAPSULE BY MOUTH EVERY DAY 90 capsule 3   No current facility-administered medications on file prior to visit.    Allergies  Allergen Reactions   Tetracycline     Rash, blisters   Penicillins     As a child--rash   Codeine Nausea Only    Very disoriented    Past Medical History:  Diagnosis Date   Allergic rhinitis    Allergy    seasonal allergies-on meds   Arthritis    generalized   Asthmatic bronchitis 1997   controlled with meds-change in work mold exposure-hx of   Attention deficit disorder    Meds in past--stopped with retirement   Bladder cancer ([facility]) 2018   GERD (gastroesophageal reflux disease)    on meds   Hepatitis    "i had hepatitis  when i was 16"   History of colonic polyps    Benjamin Cortez   History of kidney stones 1970's   stone basket extraction   Hypertension    on meds   Ocular migraine    Plantar fasciitis 2018   Pneumonia    PONV (postoperative nausea and vomiting)    Vitamin D deficiency     Past Surgical History:  Procedure Laterality Date   ANTERIOR CERVICAL DECOMP/DISCECTOMY FUSION N/A 08/14/2019   Procedure: ANTERIOR CERVICAL DECOMPRESSION FUSION CERVICAL 4-5 WITH INSTRUMENTATION AND ALLOGRAFT;  Surgeon: Estill Bamberg, MD;  Location[facility];  Service: Orthopedics;  Laterality: N/A;   APPENDECTOMY  1959   CHOLECYSTECTOMY  2005   COLONOSCOPY  2018   SA-MAC-suprep (ade)-polyps   CYSTOSCOPY WITH RETROGRADE PYELOGRAM, URETEROSCOPY AND STENT PLACEMENT Right 09/07/2016   Procedure: CYSTOSCOPY WITH RIGHT RETROGRADE PYELOGRAM;  Surgeon: Sebastian Ache, MD;  Location: [facility];  Service: Urology;  Laterality: Right;   KIDNEY STONE SURGERY  1970's   Basket extraction   POLYPECTOMY     TONSILLECTOMY  1959   TRANSURETHRAL RESECTION OF BLADDER TUMOR N/A 09/07/2016   Procedure: TRANSURETHRAL RESECTION OF BLADDER TUMOR (TURBT);  Surgeon: Sebastian Ache, MD;  Location: [facility];  Service: Urology;  Laterality: N/A;  Family History  Problem Relation Age of Onset   Thyroid disease Mother    Dementia Mother    Leukemia Father    Colon cancer Neg Hx    Stomach cancer Neg Hx    Esophageal cancer Neg Hx    Rectal cancer Neg Hx    Prostate cancer Neg Hx    Pancreatic cancer Neg Hx     Social History   Socioeconomic History   Marital status: Married    Spouse name: Benjamin Cortez   Number of children: 2   Years of education: 12+   Highest education level: Not on file  Occupational History   Occupation: Surveyor, minerals- Retired    Comment: retired 2014  Tobacco Use   Smoking status: Former    Packs/day: 1.00    Years: 16.00    Additional pack years: 0.00    Total pack years:  16.00    Types: Cigarettes    Quit date: 02/07/1977    Years since quitting: 45.3    Passive exposure: Past   Smokeless tobacco: Never  Vaping Use   Vaping Use: Never used  Substance and Sexual Activity   Alcohol use: Not Currently    Comment: rarely   Drug use: No   Sexual activity: Yes  Other Topics Concern   Not on file  Social History Narrative   Lives with wife   Caffeine use:tea/coffee daily   Right handed   Has living will   Would want wife to make health care decisions-- son Benjamin Cortez is alternate   Would accept resuscitation attempts   Not sure about tube feeds   Social Determinants of Health   Financial Resource Strain: Not on file  Food Insecurity: Not on file  Transportation Needs: Not on file  Physical Activity: Not on file  Stress: Not on file  Social Connections: Not on file  Intimate Partner Violence: Not on file   Review of Systems No dietary changes Has some aching around lower back/flanks---mostly if twisting     Objective:   Physical Exam Constitutional:      Appearance: Normal appearance.  Musculoskeletal:     Comments: No specific tenderness at right 1st MTP No red or tender joints in feet at all  Neurological:     Mental Status: He is alert.            Assessment & Plan:

## 2022-06-20 NOTE — Patient Instructions (Signed)
You can try over the counter capsaicin, alpha lipoic acid or a TENS unit. You can also take more of the gabapentin--- 100 or 200mg  up to three times a day

## 2022-06-20 NOTE — Assessment & Plan Note (Signed)
Nothing to suggest gout Just some worsening Discussed capsaicin, alpha lipoic acid or even TENS unit Can adjust gabapentin to 100-200 tid as tolerated

## 2022-06-21 ENCOUNTER — Other Ambulatory Visit: Payer: Self-pay | Admitting: *Deleted

## 2022-06-21 NOTE — Telephone Encounter (Signed)
Spoke to patient by telephone and was advised that he has changed the way that he takes Gabapentin. Patient stated that he takes 2 tablets am, 1 pm and 2 at night.

## 2022-06-21 NOTE — Progress Notes (Signed)
Spoke to patient by telephone and was advised that he is taking Gabapentin 100 mg 2 in the am, 1 afternoon and 2 at night.

## 2022-06-22 MED ORDER — GABAPENTIN 100 MG PO CAPS: ORAL_CAPSULE | ORAL | 11 refills | Status: DC

## 2022-07-05 ENCOUNTER — Ambulatory Visit (INDEPENDENT_AMBULATORY_CARE_PROVIDER_SITE_OTHER): Payer: PPO | Admitting: Internal Medicine

## 2022-07-05 ENCOUNTER — Encounter: Payer: Self-pay | Admitting: Internal Medicine

## 2022-07-05 VITALS — BP 110/60 | HR 70 | Temp 97.1°F | Ht 67.0 in | Wt 224.0 lb

## 2022-07-05 DIAGNOSIS — R1011 Right upper quadrant pain: Secondary | ICD-10-CM | POA: Insufficient documentation

## 2022-07-05 NOTE — Assessment & Plan Note (Signed)
Goes back a year but suddenly much worse Does have some apparent abdominal tenderness-but no gallbladder No rib tenderness Could be radicular--but not clear cut Will check CT scan to be sure no clear cut pathology that requires action

## 2022-07-05 NOTE — Progress Notes (Signed)
Subjective:    Patient ID: Benjamin Cortez, male    DOB: Oct 29, 1945, 77 y.o.   MRN: [id number]  HPI Here due to right abdominal pain  For over a year--will have intermittent pain in bottom of right ribs Got real bad a few days ago---taking tylenol and ibuprofen---only barely took the edge off It "gnawed" at me Did improve after 3 days Hard even moving around  No fever No rash  No N/V Appetite is off slightly  Bowels move okay--some gas (not new)  Feels it deep inside Seems to worsen with motion---worse with bounce in steps  Current Outpatient Medications on File Prior to Visit  Medication Sig Dispense Refill   albuterol (VENTOLIN HFA) 108 (90 Base) MCG/ACT inhaler Inhale 2 puffs into the lungs every 6 (six) hours as needed for wheezing or shortness of breath.     fluocinonide cream (LIDEX) 0.05 % APPLY SPARINGLY TO AFFECTED AREA TWICE A DAY     gabapentin (NEURONTIN) 100 MG capsule TAKE 2 capsules in the am, 1 in the pm and 2 at night. 150 capsule 11   losartan-hydrochlorothiazide (HYZAAR) 50-12.5 MG tablet TAKE 1/2 TABLET BY MOUTH EVERY DAY 45 tablet 3   Misc Natural Products (GLUCOSAMINE CHOND DOUBLE STR PO) Take by mouth.     Multiple Vitamin (MULTIVITAMIN) tablet Take 1 tablet by mouth daily.     omeprazole (PRILOSEC) 20 MG capsule TAKE 1 CAPSULE BY MOUTH EVERY DAY 90 capsule 3   No current facility-administered medications on file prior to visit.    Allergies  Allergen Reactions   Tetracycline     Rash, blisters   Penicillins     As a child--rash   Codeine Nausea Only    Very disoriented    Past Medical History:  Diagnosis Date   Allergic rhinitis    Allergy    seasonal allergies-on meds   Arthritis    generalized   Asthmatic bronchitis 1997   controlled with meds-change in work mold exposure-hx of   Attention deficit disorder    Meds in past--stopped with retirement   Bladder cancer ([facility]) 2018   GERD (gastroesophageal reflux disease)    on meds    Hepatitis    "i had hepatitis when i was 16"   History of colonic polyps    [name]   History of kidney stones 1970's   stone basket extraction   Hypertension    on meds   Ocular migraine    Plantar fasciitis 2018   Pneumonia    PONV (postoperative nausea and vomiting)    Vitamin D deficiency     Past Surgical History:  Procedure Laterality Date   ANTERIOR CERVICAL DECOMP/DISCECTOMY FUSION N/A 08/14/2019   Procedure: ANTERIOR CERVICAL DECOMPRESSION FUSION CERVICAL 4-5 WITH INSTRUMENTATION AND ALLOGRAFT;  Surgeon: Estill Bamberg, MD;  Location: [facility];  Service: Orthopedics;  Laterality: N/A;   APPENDECTOMY  1959   CHOLECYSTECTOMY  2005   COLONOSCOPY  2018   SA-MAC-suprep (ade)-polyps   CYSTOSCOPY WITH RETROGRADE PYELOGRAM, URETEROSCOPY AND STENT PLACEMENT Right 09/07/2016   Procedure: CYSTOSCOPY WITH RIGHT RETROGRADE PYELOGRAM;  Surgeon: Sebastian Ache, MD;  Location: [facility];  Service: Urology;  Laterality: Right;   KIDNEY STONE SURGERY  1970's   Basket extraction   POLYPECTOMY     TONSILLECTOMY  1959   TRANSURETHRAL RESECTION OF BLADDER TUMOR N/A 09/07/2016   Procedure: TRANSURETHRAL RESECTION OF BLADDER TUMOR (TURBT);  Surgeon: Sebastian Ache, MD;  Location: [facility];  Service: Urology;  Laterality: N/A;    Family History  Problem Relation Age of Onset   Thyroid disease Mother    Dementia Mother    Leukemia Father    Colon cancer Neg Hx    Stomach cancer Neg Hx    Esophageal cancer Neg Hx    Rectal cancer Neg Hx    Prostate cancer Neg Hx    Pancreatic cancer Neg Hx     Social History   Socioeconomic History   Marital status: Married    Spouse name: Olegario Messier   Number of children: 2   Years of education: 12+   Highest education level: Associate degree: occupational, Scientist, product/process development, or vocational program  Occupational History   Occupation: Surveyor, minerals- Retired    Comment: retired 2014  Tobacco Use   Smoking status: Former     Packs/day: 1.00    Years: 16.00    Additional pack years: 0.00    Total pack years: 16.00    Types: Cigarettes    Quit date: 02/07/1977    Years since quitting: 45.4    Passive exposure: Past   Smokeless tobacco: Never  Vaping Use   Vaping Use: Never used  Substance and Sexual Activity   Alcohol use: Not Currently    Comment: rarely   Drug use: No   Sexual activity: Yes  Other Topics Concern   Not on file  Social History Narrative   Lives with wife   Caffeine use:tea/coffee daily   Right handed   Has living will   Would want wife to make health care decisions-- son [name] is alternate   Would accept resuscitation attempts   Not sure about tube feeds   Social Determinants of Health   Financial Resource Strain: Low Risk  (07/04/2022)   Overall Financial Resource Strain (CARDIA)    Difficulty of Paying Living Expenses: Not hard at all  Food Insecurity: No Food Insecurity (07/04/2022)   Hunger Vital Sign    Worried About Running Out of Food in the Last Year: Never true    Ran Out of Food in the Last Year: Never true  Transportation Needs: No Transportation Needs (07/04/2022)   PRAPARE - Administrator, Civil Service (Medical): No    Lack of Transportation (Non-Medical): No  Physical Activity: Insufficiently Active (07/04/2022)   Exercise Vital Sign    Days of Exercise per Week: 3 days    Minutes of Exercise per Session: 30 min  Stress: No Stress Concern Present (07/04/2022)   [employer] of Occupational Health - Occupational Stress Questionnaire    Feeling of Stress : Not at all  Social Connections: Moderately Integrated (07/04/2022)   Social Connection and Isolation Panel [NHANES]    Frequency of Communication with Friends and Family: Once a week    Frequency of Social Gatherings with Friends and Family: Once a week    Attends Religious Services: More than 4 times per year    Active Member of [employer] or Organizations: Yes    Attends Hospital doctor: More than 4 times per year    Marital Status: Married  Catering manager Violence: Not on file   Review of Systems Never had shingles No cough or SOB Increased gabapentin dose has really helped    Objective:   Physical Exam Constitutional:      Appearance: Normal appearance.  Cardiovascular:     Rate and Rhythm: Normal rate and regular rhythm.     Heart sounds: No murmur heard.    No  gallop.  Pulmonary:     Effort: Pulmonary effort is normal.     Breath sounds: Normal breath sounds. No wheezing or rales.  Abdominal:     General: There is no distension.     Palpations: Abdomen is soft. There is no mass.     Tenderness: There is no guarding or rebound.     Comments: Mild right upper quadrant tenderness  Musculoskeletal:     Cervical back: Neck supple.  Lymphadenopathy:     Cervical: No cervical adenopathy.  Neurological:     Mental Status: He is alert.            Assessment & Plan:

## 2022-07-15 ENCOUNTER — Ambulatory Visit
Admission: RE | Admit: 2022-07-15 | Discharge: 2022-07-15 | Disposition: A | Discharge status: Home / Self Care | Payer: PPO | Source: Ambulatory Visit | Attending: Internal Medicine | Admitting: Internal Medicine

## 2022-07-15 DIAGNOSIS — R1011 Right upper quadrant pain: Secondary | ICD-10-CM | POA: Diagnosis not present

## 2022-07-15 DIAGNOSIS — K5730 Diverticulosis of large intestine without perforation or abscess without bleeding: Secondary | ICD-10-CM | POA: Diagnosis not present

## 2022-07-15 DIAGNOSIS — K760 Fatty (change of) liver, not elsewhere classified: Secondary | ICD-10-CM | POA: Diagnosis not present

## 2022-07-15 DIAGNOSIS — I700 Atherosclerosis of aorta: Secondary | ICD-10-CM | POA: Diagnosis not present

## 2022-07-15 MED ORDER — IOPAMIDOL (ISOVUE-300) INJECTION 61%
100.0000 mL | Freq: Once | INTRAVENOUS | Status: AC | PRN
  Administered 2022-07-15: 100 mL via INTRAVENOUS

## 2022-08-04 ENCOUNTER — Other Ambulatory Visit: Payer: PPO

## 2022-08-23 ENCOUNTER — Encounter: Payer: Self-pay | Admitting: Gastroenterology

## 2022-08-23 ENCOUNTER — Ambulatory Visit: Payer: PPO | Admitting: Gastroenterology

## 2022-08-23 VITALS — BP 124/70 | HR 72 | Ht 66.0 in | Wt 224.2 lb

## 2022-08-23 DIAGNOSIS — K649 Unspecified hemorrhoids: Secondary | ICD-10-CM

## 2022-08-23 DIAGNOSIS — K219 Gastro-esophageal reflux disease without esophagitis: Secondary | ICD-10-CM

## 2022-08-23 DIAGNOSIS — R1011 Right upper quadrant pain: Secondary | ICD-10-CM | POA: Diagnosis not present

## 2022-08-23 DIAGNOSIS — K760 Fatty (change of) liver, not elsewhere classified: Secondary | ICD-10-CM | POA: Diagnosis not present

## 2022-08-23 DIAGNOSIS — Z79899 Other long term (current) drug therapy: Secondary | ICD-10-CM | POA: Diagnosis not present

## 2022-08-23 DIAGNOSIS — Z86010 Personal history of colonic polyps: Secondary | ICD-10-CM | POA: Diagnosis not present

## 2022-08-23 MED ORDER — CITRUCEL PO POWD: 1.0000 | Freq: Every day | ORAL | Status: DC

## 2022-08-23 MED ORDER — PHENYLEPHRINE IN HARD FAT 0.25 % RE SUPP: 1.0000 | RECTAL | Status: AC | PRN

## 2022-08-23 NOTE — Progress Notes (Signed)
HPI :  77 year old with a history of colon polyps, last seen her colonoscopy in March 2022, here for follow-up visit to discuss hemorrhoids, reflux, abdominal pain, fatty liver, history of colon polyps.  Last colonoscopy in March 22 was remarkable for 7 small adenomas and we recommended repeat exam in 3 years.  He states that he has had a history of IBS in the past, some trigger foods can alter his bowels.  He has questions about if he should have his hemorrhoids treated.  He has some irritating hemorrhoids that bother him at times, perhaps once every 2 weeks or so.  Will use Preparation H which helps at the time.  He has some irritation but denies any significant prolapse.  Denies any straining with his bowels.  He has a history of reflux for which he takes omeprazole 20 mg/day.  He states after eating certain foods reflux can bother him.  Generally the omeprazole works pretty well for him.  Denies any dysphagia.  No nausea or vomiting.  He has been having some abdominal pain in his right upper side under the costal margin.  He states has been going on for about 10 years intermittently.  Can often bother him after he eats something, such as barbecue or fatty food.  The frequency and severity can fluctuate.  He has a history of cholecystectomy.  He had a CT scan in June which did not show any clear cause for this.  He had an EGD 11 years ago which showed no evidence of Barrett's.  He inquires about long-term safety of omeprazole and risks of chronic therapy.  Noted during his CT scan he has hepatic steatosis.  He denies any significant alcohol use.  LFTs have been normal.  He currently weighs 224 pounds with a body mass index of 36.  Weight was around 210 and went up around time of COVID.  He is also had surgery of his neck and back.  He has not been exercising much lately as he takes care of his grandkids.  No family history of cirrhosis he is aware of.  Prior evaluation: Colonoscopy 11/24/16: - The  perianal and digital rectal examinations were normal. - Two sessile polyps were found in the cecum. The polyps were 3 to 5 mm in size. These polyps were removed with a cold snare. Resection and retrieval were complete. - Six sessile polyps were found in the ascending colon. The polyps were 3 to 5 mm in size. These polyps were removed with a cold snare. Resection and retrieval were complete. - A 4 mm polyp was found in the hepatic flexure. The polyp was sessile. The polyp was removed with a cold snare. Resection and retrieval were complete. - Four sessile polyps were found in the transverse colon. The polyps were 3 to 5 mm in size. These polyps were removed with a cold snare. Resection and retrieval were complete. - A small worm were found in the transverse colon. It was removed with a cold forceps for histology. I suspect this represents a pinworm - Multiple medium-mouthed diverticula were found in the sigmoid colon. - Internal hemorrhoids were found during retroflexion. - The exam was otherwise without abnormality.  1. Surgical [P], hepatic flexure, transverse, ascending, and cecum, polyp (13) - TUBULAR ADENOMA (FIVE FRAGMENTS). - SESSILE SERRATED POLYP (ONE FRAGMENT). - BENIGN COLONIC MUCOSA (SEVEN FRAGMENTS). - NO HIGH GRADE DYSPLASIA OR MALIGNANCY. 2. Surgical [P], worm removed from colon - COLONIC MUCOSA WITH NO SIGNIFICANT PATHOLOGIC CHANGES. - NO  PARASITE TISSUE, ADENOMATOUS CHANGE OR MALIGNANCY.    Colonoscopy 04/23/20: - The perianal and digital rectal examinations were normal. - Two sessile polyps were found in the cecum. The polyps were 2 to 4 mm in size. These polyps were removed with a cold snare. Resection and retrieval were complete. - A diminutive polyp was found in the ascending colon. The polyp was sessile. The polyp was removed with a cold snare. Resection and retrieval were complete. - Multiple medium-mouthed diverticula were found in the transverse colon and left colon. - A 5 mm  polyp was found in the hepatic flexure. The polyp was sessile. The polyp was removed with a cold snare. Resection and retrieval were complete. - Two sessile polyps were found in the transverse colon. The polyps were 4 to 5 mm in size. These polyps were removed with a cold snare. Resection and retrieval were complete. - A 3 mm polyp was found in the sigmoid colon. The polyp was sessile. The polyp was removed with a cold snare. Resection and retrieval were complete. - Internal hemorrhoids were found during retroflexion. - The exam was otherwise without abnormality  Surgical [P], colon, sigmoid, transverse, ascending and cecum, hepatic flexure, polyp (7) - TUBULAR ADENOMA (X MULTIPLE). - NEGATIVE FOR HIGH GRADE DYSPLASIA.  Repeat in 3 years    CT scan abdomen / pelvis 07/15/22: IMPRESSION: 1. No acute or focal lesion to explain the patient's symptoms. 2. Hepatic steatosis. 3. Cholecystectomy and appendectomy. 4. Descending and sigmoid diverticulosis without diverticulitis. 5. Multilevel facet degenerative changes in the lumbar spine demonstrate interval progression. 6.  Aortic Atherosclerosis (ICD10-I70.0).   EGD 11/23/11: Dr. Juanda Chance - "suspected Barrett's" - no mention of segment description or length - biopsies show reactive change and no Barrett's    Past Medical History:  Diagnosis Date   Allergic rhinitis    Allergy    seasonal allergies-on meds   Arthritis    generalized   Asthmatic bronchitis 1997   controlled with meds-change in work mold exposure-hx of   Attention deficit disorder    Meds in past--stopped with retirement   Barrett's esophagus    Bladder cancer (HCC) 2018   GERD (gastroesophageal reflux disease)    on meds   Hepatitis    "i had hepatitis when i was 16"   History of colonic polyps    [name]   History of kidney stones 1970's   stone basket extraction   Hypertension    on meds   Ocular migraine    Plantar fasciitis 2018   Pneumonia    PONV  (postoperative nausea and vomiting)    Vitamin D deficiency      Past Surgical History:  Procedure Laterality Date   ANTERIOR CERVICAL DECOMP/DISCECTOMY FUSION N/A 08/14/2019   Procedure: ANTERIOR CERVICAL DECOMPRESSION FUSION CERVICAL 4-5 WITH INSTRUMENTATION AND ALLOGRAFT;  Surgeon: Estill Bamberg, MD;  Location: [facility];  Service: Orthopedics;  Laterality: N/A;   APPENDECTOMY  1959   CHOLECYSTECTOMY  2005   COLONOSCOPY  2018   SA-MAC-suprep (ade)-polyps   CYSTOSCOPY WITH RETROGRADE PYELOGRAM, URETEROSCOPY AND STENT PLACEMENT Right 09/07/2016   Procedure: CYSTOSCOPY WITH RIGHT RETROGRADE PYELOGRAM;  Surgeon: Sebastian Ache, MD;  Location[facility];  Service: Urology;  Laterality: Right;   KIDNEY STONE SURGERY  1970's   Basket extraction   POLYPECTOMY     TONSILLECTOMY  1959   TRANSURETHRAL RESECTION OF BLADDER TUMOR N/A 09/07/2016   Procedure: TRANSURETHRAL RESECTION OF BLADDER TUMOR (TURBT);  Surgeon: Sebastian Ache, MD;  Location: [facility];  Service: Urology;  Laterality: N/A;   Family History  Problem Relation Age of Onset   Thyroid disease Mother    Dementia Mother    Leukemia Father    Colon cancer Neg Hx    Stomach cancer Neg Hx    Esophageal cancer Neg Hx    Rectal cancer Neg Hx    Prostate cancer Neg Hx    Pancreatic cancer Neg Hx    Social History   Tobacco Use   Smoking status: Former    Current packs/day: 0.00    Average packs/day: 1 pack/day for 16.0 years (16.0 ttl pk-yrs)    Types: Cigarettes    Start date: 02/07/1961    Quit date: 02/07/1977    Years since quitting: 45.5    Passive exposure: Past   Smokeless tobacco: Never  Vaping Use   Vaping status: Never Used  Substance Use Topics   Alcohol use: Not Currently    Comment: rarely   Drug use: No   Current Outpatient Medications  Medication Sig Dispense Refill   albuterol (VENTOLIN HFA) 108 (90 Base) MCG/ACT inhaler Inhale 2 puffs into the lungs every 6 (six) hours as  needed for wheezing or shortness of breath.     fluocinonide cream (LIDEX) 0.05 % APPLY SPARINGLY TO AFFECTED AREA TWICE A DAY     gabapentin (NEURONTIN) 100 MG capsule TAKE 2 capsules in the am, 1 in the pm and 2 at night. 150 capsule 11   losartan-hydrochlorothiazide (HYZAAR) 50-12.5 MG tablet TAKE 1/2 TABLET BY MOUTH EVERY DAY 45 tablet 3   Misc Natural Products (GLUCOSAMINE CHOND DOUBLE STR PO) Take by mouth.     Multiple Vitamin (MULTIVITAMIN) tablet Take 1 tablet by mouth daily.     omeprazole (PRILOSEC) 20 MG capsule TAKE 1 CAPSULE BY MOUTH EVERY DAY 90 capsule 3   No current facility-administered medications for this visit.   Allergies  Allergen Reactions   Tetracycline     Rash, blisters   Penicillins     As a child--rash   Codeine Nausea Only    Very disoriented     Review of Systems: All systems reviewed and negative except where noted in HPI.   Lab Results  Component Value Date   WBC 5.0 03/25/2022   HGB 15.0 03/25/2022   HCT 43.4 03/25/2022   MCV 94.1 03/25/2022   PLT 231.0 03/25/2022    Lab Results  Component Value Date   NA 140 03/25/2022   CL 105 03/25/2022   K 4.5 03/25/2022   CO2 30 03/25/2022   BUN 17 03/25/2022   CREATININE 1.19 03/25/2022   GFR 60.33 03/25/2022   CALCIUM 9.3 03/25/2022   PHOS 2.5 03/19/2019   ALBUMIN 4.1 03/25/2022   GLUCOSE 104 (H) 03/25/2022    Lab Results  Component Value Date   ALT 29 03/25/2022   AST 22 03/25/2022   ALKPHOS 85 03/25/2022   BILITOT 0.8 03/25/2022   Lab Results  Component Value Date   HGBA1C 6.2 03/25/2022     Physical Exam: BP 124/70 (BP Location: Left Arm, Patient Position: Sitting, Cuff Size: Normal)   Pulse 72   Ht 5\' 6"  (1.676 m)   Wt 224 lb 4 oz (101.7 kg)   BMI 36.19 kg/m  Constitutional: Pleasant,well-developed, male in no acute distress. Neurological: Alert and oriented to person place and time. Skin: Skin is warm and dry. No rashes noted. Psychiatric: Normal mood and affect.  Behavior is normal.  ASSESSMENT: 77 y.o. male here for assessment of the following  1. Hemorrhoids, unspecified hemorrhoid type   2. Gastroesophageal reflux disease, unspecified whether esophagitis present   3. RUQ pain   4. Long-term current use of proton pump inhibitor therapy   5. Fatty liver   6. History of colon polyps    Discussed his bowel habits and hemorrhoid symptoms.  Hemorrhoid symptoms are mild and intermittent.  We discussed options for therapy.  Preparation H works pretty well.  We discussed hemorrhoid banding, I think reasonable to start with trial of daily Citrucel to try to keep bowels regular, avoid straining, and see how he does with this.  If he has persistent hemorrhoid symptoms despite Citrucel he can contact me to consider hemorrhoid banding.  Ultimately it depends on how much this bothers him, we discussed risks of therapy and what that would entail if he determines he wants to proceed with it.  His colonoscopy is up-to-date, he is due for surveillance exam 3 years from his last exam for 7 adenomas on the last exam, so due March 2025.  We discussed his postprandial right upper quadrant pain that has been ongoing for more than 10 years.  He is status post cholecystectomy.  Appears to be related with eating heavier foods.  CT scan recently negative.  Given his prandial association I think an EGD is reasonable.  We discussed what EGD is, risks and benefits.  He wants to hold off for now as things seem stable, but would consider it at the time of his colonoscopy in March.  Up to him if he wants to do it sooner pending his course.  He can try increasing omeprazole to twice daily or double dose on days where he thinks he might eat heavier to produce this, symptoms could represent dyspepsia.  We discussed his omeprazole use.  Long-term want to use lowest dose needed to control symptoms.  We reviewed long-term risks of chronic PPIs and he understands.  He feels benefits outweigh  risks at this time.  No evidence of Barrett's historically.  Incidentally noted on his imaging he had fatty liver which we discussed.  His LFTs are normal which is good, no evidence of cirrhosis on imaging.  We discussed fatty liver in general, risk for fibrotic change and cirrhosis over time.  He has gained weight in recent years.  Ultimately he should work on weight loss for now, trend LFTs yearly.  A1C is around 6.  He will work on this and try to increase exercise.  He should be seen yearly for this issue.  He does not drink much alcohol.   PLAN: - recommend Citrucel once daily, using preparation H as needed - offered hemorrhoid banding, he wants to hold off for now - offered EGD to evaluate RUQ pain - he wants to do it in March 2025 with colonoscopy. Ongoing for 10 years, negative CT, post chole - can increased omeprazole to BID in the interim to see if it helps - counseled on long term risks of chronic PPI use - counseled him on fatty liver - work on weight loss, LFTs normal, recall for LFTs in February 2025. A1c around 6, he will work on exercise - due for surveillance colonoscopy March 2025 for history of polyps on the last exam  Harlin Rain, MD [facility]

## 2022-08-23 NOTE — Patient Instructions (Addendum)
You will be due for a recall colonoscopy in 04-2023. We will send you a reminder in the mail when it gets closer to that time. You can elect to have EGD at that time to evaluate your chronic RUQ pain  Please purchase the following medications over the counter and take as directed: Citrucel - once daily PrepH - use as needed for hemorrhoids  You will be due for labs (LFTs) in Feb. 2025.   Thank you for entrusting me with your care and for choosing [facility], Dr. Ileene Patrick   If your blood pressure at your visit was 140/90 or greater, please contact your primary care physician to follow up on this. ______________________________________________________  If you are age 55 or older, your body mass index should be between 23-30. Your Body mass index is 36.19 kg/m. If this is out of the aforementioned range listed, please consider follow up with your Primary Care Provider.  If you are age 46 or younger, your body mass index should be between 19-25. Your Body mass index is 36.19 kg/m. If this is out of the aformentioned range listed, please consider follow up with your Primary Care Provider.  ________________________________________________________  The [facility] providers would like to encourage you to use [facility] to communicate with providers for non-urgent requests or questions.  Due to long hold times on the telephone, sending your provider a message by [facility] may be a faster and more efficient way to get a response.  Please allow 48 business hours for a response.  Please remember that this is for non-urgent requests.  _______________________________________________________  Due to recent changes in healthcare laws, you may see the results of your imaging and laboratory studies on MyChart before your provider has had a chance to review them.  We understand that in some cases there may be results that are confusing or concerning to you. Not all laboratory results come back in  the same time frame and the provider may be waiting for multiple results in order to interpret others.  Please give [place] 48 hours in order for your provider to thoroughly review all the results before contacting the office for clarification of your results.

## 2022-10-26 DIAGNOSIS — H5319 Other subjective visual disturbances: Secondary | ICD-10-CM | POA: Diagnosis not present

## 2022-10-26 DIAGNOSIS — H35373 Puckering of macula, bilateral: Secondary | ICD-10-CM | POA: Diagnosis not present

## 2022-10-26 DIAGNOSIS — H53482 Generalized contraction of visual field, left eye: Secondary | ICD-10-CM | POA: Diagnosis not present

## 2022-11-15 DIAGNOSIS — C670 Malignant neoplasm of trigone of bladder: Secondary | ICD-10-CM | POA: Diagnosis not present

## 2022-11-15 DIAGNOSIS — R3915 Urgency of urination: Secondary | ICD-10-CM | POA: Diagnosis not present

## 2023-03-15 DIAGNOSIS — K760 Fatty (change of) liver, not elsewhere classified: Secondary | ICD-10-CM

## 2023-03-15 NOTE — Telephone Encounter (Signed)
Per last OV note, patient due for LFTs.  Orders entered and MyChart message sent to patient to go to lab one day this week or next

## 2023-03-15 NOTE — Telephone Encounter (Signed)
-----   Message from [facility] sent at 08/23/2022  9:00 AM EDT ----- Regarding: LFTs Patient due for LFTs in February

## 2023-03-17 ENCOUNTER — Encounter: Payer: Self-pay | Admitting: Gastroenterology

## 2023-03-17 ENCOUNTER — Other Ambulatory Visit (INDEPENDENT_AMBULATORY_CARE_PROVIDER_SITE_OTHER): Payer: PPO

## 2023-03-17 DIAGNOSIS — K760 Fatty (change of) liver, not elsewhere classified: Secondary | ICD-10-CM

## 2023-03-17 LAB — HEPATIC FUNCTION PANEL
ALT: 26 U/L (ref 0–53)
AST: 22 U/L (ref 0–37)
Albumin: 4.1 g/dL (ref 3.5–5.2)
Alkaline Phosphatase: 95 U/L (ref 39–117)
Bilirubin, Direct: 0.2 mg/dL (ref 0.0–0.3)
Total Bilirubin: 0.7 mg/dL (ref 0.2–1.2)
Total Protein: 6.8 g/dL (ref 6.0–8.3)

## 2023-03-27 ENCOUNTER — Encounter: Payer: Self-pay | Admitting: Internal Medicine

## 2023-03-27 ENCOUNTER — Encounter: Payer: Self-pay | Admitting: Gastroenterology

## 2023-03-27 ENCOUNTER — Ambulatory Visit: Payer: PPO | Admitting: Internal Medicine

## 2023-03-27 VITALS — BP 124/86 | HR 63 | Temp 98.0°F | Ht 66.5 in | Wt 222.0 lb

## 2023-03-27 DIAGNOSIS — G629 Polyneuropathy, unspecified: Secondary | ICD-10-CM

## 2023-03-27 DIAGNOSIS — K2100 Gastro-esophageal reflux disease with esophagitis, without bleeding: Secondary | ICD-10-CM

## 2023-03-27 DIAGNOSIS — R7303 Prediabetes: Secondary | ICD-10-CM | POA: Diagnosis not present

## 2023-03-27 DIAGNOSIS — I10 Essential (primary) hypertension: Secondary | ICD-10-CM

## 2023-03-27 DIAGNOSIS — Z0000 Encounter for general adult medical examination without abnormal findings: Secondary | ICD-10-CM

## 2023-03-27 DIAGNOSIS — Z8551 Personal history of malignant neoplasm of bladder: Secondary | ICD-10-CM

## 2023-03-27 LAB — CBC
HCT: 43 % (ref 39.0–52.0)
Hemoglobin: 14.8 g/dL (ref 13.0–17.0)
MCHC: 34.4 g/dL (ref 30.0–36.0)
MCV: 95.9 fL (ref 78.0–100.0)
Platelets: 214 10*3/uL (ref 150.0–400.0)
RBC: 4.48 Mil/uL (ref 4.22–5.81)
RDW: 12.8 % (ref 11.5–15.5)
WBC: 5.2 10*3/uL (ref 4.0–10.5)

## 2023-03-27 LAB — LIPID PANEL
Cholesterol: 156 mg/dL (ref 0–200)
HDL: 43 mg/dL (ref >39.00)
LDL Cholesterol: 91 mg/dL (ref 0–99)
NonHDL: 113.31
Total CHOL/HDL Ratio: 4
Triglycerides: 110 mg/dL (ref 0.0–149.0)
VLDL: 22 mg/dL (ref 0.0–40.0)

## 2023-03-27 LAB — RENAL FUNCTION PANEL
Albumin: 4.2 g/dL (ref 3.5–5.2)
BUN: 14 mg/dL (ref 6–23)
CO2: 30 meq/L (ref 19–32)
Calcium: 9 mg/dL (ref 8.4–10.5)
Chloride: 105 meq/L (ref 96–112)
Creatinine, Ser: 1.09 mg/dL (ref 0.40–1.50)
GFR: 66.57 mL/min (ref >60.00)
Glucose, Bld: 104 mg/dL — ABNORMAL HIGH (ref 70–99)
Phosphorus: 2.7 mg/dL (ref 2.3–4.6)
Potassium: 3.9 meq/L (ref 3.5–5.1)
Sodium: 141 meq/L (ref 135–145)

## 2023-03-27 LAB — HEMOGLOBIN A1C: Hgb A1c MFr Bld: 6.2 % (ref 4.6–6.5)

## 2023-03-27 MED ORDER — GABAPENTIN 100 MG PO CAPS: ORAL_CAPSULE | ORAL | 0 refills | Status: DC

## 2023-03-27 MED ORDER — OMEPRAZOLE 20 MG PO CPDR: DELAYED_RELEASE_CAPSULE | ORAL | 3 refills | Status: AC

## 2023-03-27 NOTE — Assessment & Plan Note (Signed)
 BP Readings from Last 3 Encounters:  03/27/23 124/86  08/23/22 124/70  07/05/22 110/60   Controlled on the losartan/hydrochlorothiazide 50.12.5 daily Will check labs

## 2023-03-27 NOTE — Assessment & Plan Note (Signed)
 Mild---will recheck

## 2023-03-27 NOTE — Assessment & Plan Note (Signed)
 Controlled with the daily omeprazole 20mg  daily

## 2023-03-27 NOTE — Assessment & Plan Note (Signed)
 Ongoing symptoms Takes gabapentin 100 AM, 200 bedtime

## 2023-03-27 NOTE — Progress Notes (Signed)
 Subjective:    Patient ID: Benjamin Cortez, male    DOB: 09-May-1945, 78 y.o.   MRN: [id number]  HPI Here for Medicare wellness visit and follow up of chronic health conditions Reviewed advanced directives Reviewed other doctors---Dr Manny---urology, Dr Pasty Arch, Dr Tally Due, Dr Tim Lair, Dr Mottinger--dentist No hospitalizations or surgery in the past year Had gotten back to some exercise in past 2 months---weight down a few pounds since then Vision is okay--some film in left eye after cataract surgery--getting it checked Hearing aides---help Rare alcohol No tobacco No falls No depression or anhedonia Independent with instrumental ADLs No sig memory issues---just recall (like names)  Limited in walking still from the neuropathy Pain in feet flares at times  No chest pain or SOB No dizziness or syncope No palpitations No edema  Occasional heartburn--relates to what he eats. Uses tums prn Takes the omeprazole daily Does have occ "pocket" in throat--and will regurgitate some food Due for EGD next month  Voids well No blood Cysto yearly still  Current Outpatient Medications on File Prior to Visit  Medication Sig Dispense Refill   Alpha-Lipoic Acid 300 MG TABS Take by mouth.     fluocinonide cream (LIDEX) 0.05 % APPLY SPARINGLY TO AFFECTED AREA TWICE A DAY     gabapentin (NEURONTIN) 100 MG capsule TAKE 2 capsules in the am, 1 in the pm and 2 at night. 150 capsule 11   losartan-hydrochlorothiazide (HYZAAR) 50-12.5 MG tablet TAKE 1/2 TABLET BY MOUTH EVERY DAY 45 tablet 3   methylcellulose (CITRUCEL) oral powder Take 1 packet by mouth daily.     Misc Natural Products (GLUCOSAMINE CHOND DOUBLE STR PO) Take by mouth.     Multiple Vitamin (MULTIVITAMIN) tablet Take 1 tablet by mouth daily.     omeprazole (PRILOSEC) 20 MG capsule TAKE 1 CAPSULE BY MOUTH EVERY DAY 90 capsule 3   phenylephrine (,USE FOR PREPARATION-H,) 0.25 % suppository Place 1 suppository rectally as  needed for hemorrhoids.     No current facility-administered medications on file prior to visit.    Allergies  Allergen Reactions   Tetracycline     Rash, blisters   Penicillins     As a child--rash   Codeine Nausea Only    Very disoriented    Past Medical History:  Diagnosis Date   Allergic rhinitis    Allergy    seasonal allergies-on meds   Arthritis    generalized   Asthmatic bronchitis 1997   controlled with meds-change in work mold exposure-hx of   Attention deficit disorder    Meds in past--stopped with retirement   Barrett's esophagus    Bladder cancer (HCC) 2018   GERD (gastroesophageal reflux disease)    on meds   Hepatitis    "i had hepatitis when i was 16"   History of colonic polyps    [name]   History of kidney stones 1970's   stone basket extraction   Hypertension    on meds   Ocular migraine    Plantar fasciitis 2018   Pneumonia    PONV (postoperative nausea and vomiting)    Vitamin D deficiency     Past Surgical History:  Procedure Laterality Date   ANTERIOR CERVICAL DECOMP/DISCECTOMY FUSION N/A 08/14/2019   Procedure: ANTERIOR CERVICAL DECOMPRESSION FUSION CERVICAL 4-5 WITH INSTRUMENTATION AND ALLOGRAFT;  Surgeon: Estill Bamberg, MD;  Location: [facility];  Service: Orthopedics;  Laterality: N/A;   APPENDECTOMY  1959   CHOLECYSTECTOMY  2005   COLONOSCOPY  2018   SA-MAC-suprep (  ade)-polyps   CYSTOSCOPY WITH RETROGRADE PYELOGRAM, URETEROSCOPY AND STENT PLACEMENT Right 09/07/2016   Procedure: CYSTOSCOPY WITH RIGHT RETROGRADE PYELOGRAM;  Surgeon: Sebastian Ache, MD;  Location: [facility];  Service: Urology;  Laterality: Right;   KIDNEY STONE SURGERY  1970's   Basket extraction   POLYPECTOMY     TONSILLECTOMY  1959   TRANSURETHRAL RESECTION OF BLADDER TUMOR N/A 09/07/2016   Procedure: TRANSURETHRAL RESECTION OF BLADDER TUMOR (TURBT);  Surgeon: Sebastian Ache, MD;  Location: [facility];  Service: Urology;  Laterality: N/A;     Family History  Problem Relation Age of Onset   Thyroid disease Mother    Dementia Mother    Leukemia Father    Colon cancer Neg Hx    Stomach cancer Neg Hx    Esophageal cancer Neg Hx    Rectal cancer Neg Hx    Prostate cancer Neg Hx    Pancreatic cancer Neg Hx     Social History   Socioeconomic History   Marital status: Married    Spouse name: Olegario Messier   Number of children: 2   Years of education: 12+   Highest education level: Associate degree: occupational, Scientist, product/process development, or vocational program  Occupational History   Occupation: Surveyor, minerals- Retired    Comment: retired 2014  Tobacco Use   Smoking status: Former    Current packs/day: 0.00    Average packs/day: 1 pack/day for 16.0 years (16.0 ttl pk-yrs)    Types: Cigarettes    Start date: 02/07/1961    Quit date: 02/07/1977    Years since quitting: 46.1    Passive exposure: Past   Smokeless tobacco: Never  Vaping Use   Vaping status: Never Used  Substance and Sexual Activity   Alcohol use: Not Currently    Comment: rarely   Drug use: No   Sexual activity: Yes  Other Topics Concern   Not on file  Social History Narrative   Lives with wife   Caffeine use:tea/coffee daily   Right handed   Has living will   Would want wife to make health care decisions-- son [name] is alternate   Would accept resuscitation attempts   Not sure about tube feeds-- not if cognitively unaware   Social Drivers of Health   Financial Resource Strain: Low Risk  (07/04/2022)   Overall Financial Resource Strain (CARDIA)    Difficulty of Paying Living Expenses: Not hard at all  Food Insecurity: No Food Insecurity (07/04/2022)   Hunger Vital Sign    Worried About Running Out of Food in the Last Year: Never true    Ran Out of Food in the Last Year: Never true  Transportation Needs: No Transportation Needs (07/04/2022)   PRAPARE - Administrator, Civil Service (Medical): No    Lack of Transportation (Non-Medical): No  Physical  Activity: Insufficiently Active (07/04/2022)   Exercise Vital Sign    Days of Exercise per Week: 3 days    Minutes of Exercise per Session: 30 min  Stress: No Stress Concern Present (07/04/2022)   [employer] of Occupational Health - Occupational Stress Questionnaire    Feeling of Stress : Not at all  Social Connections: Moderately Integrated (07/04/2022)   Social Connection and Isolation Panel [NHANES]    Frequency of Communication with Friends and Family: Once a week    Frequency of Social Gatherings with Friends and Family: Once a week    Attends Religious Services: More than 4 times per year  Active Member of Clubs or Organizations: Yes    Attends Banker Meetings: More than 4 times per year    Marital Status: Married  Catering manager Violence: Not on file   Review of Systems Appetite is fine Weight down slightly Sleeps pretty well---uses gabapentin and tylenol right at bedtime Wears seat belt Teeth are okay--just some redone fillings. May need new dentist Bowels move fine---no blood Some aches and pains---left hip and occasionally right. Does stretching for this Has spot on right temple to check again---gets itchy . Keeps up with derm    Objective:   Physical Exam Constitutional:      Appearance: Normal appearance.  HENT:     Mouth/Throat:     Pharynx: No oropharyngeal exudate or posterior oropharyngeal erythema.  Eyes:     Conjunctiva/sclera: Conjunctivae normal.     Pupils: Pupils are equal, round, and reactive to light.  Cardiovascular:     Rate and Rhythm: Normal rate and regular rhythm.     Pulses: Normal pulses.     Heart sounds: No murmur heard.    No gallop.  Pulmonary:     Effort: Pulmonary effort is normal.     Breath sounds: Normal breath sounds. No wheezing or rales.  Abdominal:     Palpations: Abdomen is soft.     Tenderness: There is no abdominal tenderness.  Musculoskeletal:     Cervical back: Neck supple.     Right lower leg:  No edema.     Left lower leg: No edema.  Lymphadenopathy:     Cervical: No cervical adenopathy.  Skin:    Findings: No lesion or rash.  Neurological:     General: No focal deficit present.     Mental Status: He is alert and oriented to person, place, and time.     Comments: Mini-cog normal  Psychiatric:        Mood and Affect: Mood normal.        Behavior: Behavior normal.            Assessment & Plan:

## 2023-03-27 NOTE — Assessment & Plan Note (Signed)
 I have personally reviewed the Medicare Annual Wellness questionnaire and have noted 1. The patient's medical and social history 2. Their use of alcohol, tobacco or illicit drugs 3. Their current medications and supplements 4. The patient's functional ability including ADL's, fall risks, home safety risks and hearing or visual             impairment. 5. Diet and physical activities 6. Evidence for depression or mood disorders  The patients weight, height, BMI and visual acuity have been recorded in the chart I have made referrals, counseling and provided education to the patient based review of the above and I have provided the pt with a written personalized care plan for preventive services.  I have provided you with a copy of your personalized plan for preventive services. Please take the time to review along with your updated medication list.  Getting last screening colonoscopy next month No PSA due to age Has increased exercise Shingrix at pharmacy Decided against new COVID vaccine

## 2023-03-27 NOTE — Assessment & Plan Note (Signed)
 Gets yearly cysto

## 2023-03-27 NOTE — Progress Notes (Signed)
Hearing Screening - Comments:: Has hearing aids. Wearing them today. Vision Screening - Comments:: March 2024

## 2023-03-27 NOTE — Addendum Note (Signed)
 Addended by: Tillman Abide I on: 03/27/2023 08:36 AM   Modules accepted: Orders

## 2023-03-30 DIAGNOSIS — H35373 Puckering of macula, bilateral: Secondary | ICD-10-CM | POA: Diagnosis not present

## 2023-03-30 DIAGNOSIS — H5319 Other subjective visual disturbances: Secondary | ICD-10-CM | POA: Diagnosis not present

## 2023-04-12 ENCOUNTER — Ambulatory Visit (AMBULATORY_SURGERY_CENTER): Payer: PPO

## 2023-04-12 VITALS — Ht 66.5 in | Wt 220.0 lb

## 2023-04-12 DIAGNOSIS — Z79899 Other long term (current) drug therapy: Secondary | ICD-10-CM

## 2023-04-12 DIAGNOSIS — K649 Unspecified hemorrhoids: Secondary | ICD-10-CM

## 2023-04-12 DIAGNOSIS — R1011 Right upper quadrant pain: Secondary | ICD-10-CM

## 2023-04-12 DIAGNOSIS — K760 Fatty (change of) liver, not elsewhere classified: Secondary | ICD-10-CM

## 2023-04-12 DIAGNOSIS — Z860100 Personal history of colon polyps, unspecified: Secondary | ICD-10-CM

## 2023-04-12 DIAGNOSIS — K219 Gastro-esophageal reflux disease without esophagitis: Secondary | ICD-10-CM

## 2023-04-12 MED ORDER — NA SULFATE-K SULFATE-MG SULF 17.5-3.13-1.6 GM/177ML PO SOLN
1.0000 | Freq: Once | ORAL | 0 refills | Status: AC
Start: 2023-04-12 — End: 2023-04-12

## 2023-04-12 NOTE — Progress Notes (Signed)

## 2023-04-26 ENCOUNTER — Other Ambulatory Visit: Payer: Self-pay | Admitting: Internal Medicine

## 2023-04-26 DIAGNOSIS — H35373 Puckering of macula, bilateral: Secondary | ICD-10-CM | POA: Diagnosis not present

## 2023-04-26 DIAGNOSIS — H25811 Combined forms of age-related cataract, right eye: Secondary | ICD-10-CM | POA: Diagnosis not present

## 2023-04-26 DIAGNOSIS — H26492 Other secondary cataract, left eye: Secondary | ICD-10-CM | POA: Diagnosis not present

## 2023-04-26 DIAGNOSIS — H31092 Other chorioretinal scars, left eye: Secondary | ICD-10-CM | POA: Diagnosis not present

## 2023-05-03 ENCOUNTER — Ambulatory Visit: Payer: PPO | Admitting: Gastroenterology

## 2023-05-03 ENCOUNTER — Encounter: Payer: Self-pay | Admitting: Gastroenterology

## 2023-05-03 VITALS — BP 139/73 | HR 56 | Temp 97.6°F | Resp 9 | Ht 66.5 in | Wt 220.0 lb

## 2023-05-03 DIAGNOSIS — D120 Benign neoplasm of cecum: Secondary | ICD-10-CM | POA: Diagnosis not present

## 2023-05-03 DIAGNOSIS — K635 Polyp of colon: Secondary | ICD-10-CM | POA: Diagnosis not present

## 2023-05-03 DIAGNOSIS — K5730 Diverticulosis of large intestine without perforation or abscess without bleeding: Secondary | ICD-10-CM | POA: Diagnosis not present

## 2023-05-03 DIAGNOSIS — K3189 Other diseases of stomach and duodenum: Secondary | ICD-10-CM

## 2023-05-03 DIAGNOSIS — K219 Gastro-esophageal reflux disease without esophagitis: Secondary | ICD-10-CM | POA: Diagnosis not present

## 2023-05-03 DIAGNOSIS — K648 Other hemorrhoids: Secondary | ICD-10-CM

## 2023-05-03 DIAGNOSIS — K449 Diaphragmatic hernia without obstruction or gangrene: Secondary | ICD-10-CM

## 2023-05-03 DIAGNOSIS — Z860100 Personal history of colon polyps, unspecified: Secondary | ICD-10-CM | POA: Diagnosis not present

## 2023-05-03 DIAGNOSIS — Z1211 Encounter for screening for malignant neoplasm of colon: Secondary | ICD-10-CM | POA: Diagnosis not present

## 2023-05-03 DIAGNOSIS — K317 Polyp of stomach and duodenum: Secondary | ICD-10-CM | POA: Diagnosis not present

## 2023-05-03 DIAGNOSIS — I10 Essential (primary) hypertension: Secondary | ICD-10-CM | POA: Diagnosis not present

## 2023-05-03 DIAGNOSIS — Z860101 Personal history of adenomatous and serrated colon polyps: Secondary | ICD-10-CM

## 2023-05-03 DIAGNOSIS — R1310 Dysphagia, unspecified: Secondary | ICD-10-CM

## 2023-05-03 DIAGNOSIS — D123 Benign neoplasm of transverse colon: Secondary | ICD-10-CM | POA: Diagnosis not present

## 2023-05-03 DIAGNOSIS — K6389 Other specified diseases of intestine: Secondary | ICD-10-CM | POA: Diagnosis not present

## 2023-05-03 MED ORDER — SODIUM CHLORIDE 0.9 % IV SOLN
500.0000 mL | Freq: Once | INTRAVENOUS | Status: DC
Start: 2023-05-03 — End: 2023-05-03

## 2023-05-03 NOTE — Op Note (Signed)
 [facility] Patient Name: Benjamin Cortez Procedure Date: 05/03/2023 1:01 PM MRN: [id number] Endoscopist: Viviann Spare P. Adela Lank , MD, [id number] Age: 78 Referring MD:  Date of Birth: 1945/06/23 Gender: Male Account #: [id number] Procedure:                Colonoscopy Indications:              High risk colon cancer surveillance: Personal                            history of colonic polyps - 7 adenomas removed                            04/2020 Medicines:                Monitored Anesthesia Care Procedure:                Pre-Anesthesia Assessment:                           - Prior to the procedure, a History and Physical                            was performed, and patient medications and                            allergies were reviewed. The patient's tolerance of                            previous anesthesia was also reviewed. The risks                            and benefits of the procedure and the sedation                            options and risks were discussed with the patient.                            All questions were answered, and informed consent                            was obtained. Prior Anticoagulants: The patient has                            taken no anticoagulant or antiplatelet agents. ASA                            Grade Assessment: II - A patient with mild systemic                            disease. After reviewing the risks and benefits,                            the patient was deemed in satisfactory condition to  undergo the procedure.                           After obtaining informed consent, the colonoscope                            was passed under direct vision. Throughout the                            procedure, the patient's blood pressure, pulse, and                            oxygen saturations were monitored continuously. The                            Olympus Scope SN [id number] was introduced through the                             anus and advanced to the the cecum, identified by                            appendiceal orifice and ileocecal valve. The                            colonoscopy was performed without difficulty. The                            patient tolerated the procedure well. The quality                            of the bowel preparation was adequate. The                            ileocecal valve, appendiceal orifice, and rectum                            were photographed. Scope In: 2:04:13 PM Scope Out: 2:24:04 PM Scope Withdrawal Time: 0 hours 16 minutes 45 seconds  Total Procedure Duration: 0 hours 19 minutes 51 seconds  Findings:                 The perianal and digital rectal examinations were                            normal.                           Multiple small-mouthed diverticula were found in                            the sigmoid colon.                           Two sessile polyps were found in the cecum. The  polyps were diminutive in size. These polyps were                            removed with a cold snare. Resection and retrieval                            were complete.                           A 3 mm polyp was found in the hepatic flexure. The                            polyp was sessile. The polyp was removed with a                            cold snare. Resection and retrieval were complete.                           Three sessile polyps were found in the transverse                            colon. The polyps were 2 to 3 mm in size. These                            polyps were removed with a cold snare. Resection                            and retrieval were complete.                           Internal hemorrhoids were found during retroflexion.                           The exam was otherwise without abnormality. Prep                            was adequate but several minutes spent lavaging the                             cecum and right colon to achieve adequate views. Complications:            No immediate complications. Estimated blood loss:                            Minimal. Estimated Blood Loss:     Estimated blood loss was minimal. Impression:               - Diverticulosis in the sigmoid colon.                           - Two diminutive polyps in the cecum, removed with                            a cold snare. Resected and retrieved.                           -  One 3 mm polyp at the hepatic flexure, removed                            with a cold snare. Resected and retrieved.                           - Three 2 to 3 mm polyps in the transverse colon,                            removed with a cold snare. Resected and retrieved.                           - Internal hemorrhoids.                           - The examination was otherwise normal. Recommendation:           - Patient has a contact number available for                            emergencies. The signs and symptoms of potential                            delayed complications were discussed with the                            patient. Return to normal activities tomorrow.                            Written discharge instructions were provided to the                            patient.                           - Resume previous diet.                           - Continue present medications.                           - Await pathology results. Viviann Spare P. Adela Lank, MD 05/03/2023 2:30:24 PM This report has been signed electronically.

## 2023-05-03 NOTE — Progress Notes (Signed)
 [facility] History and Physical   Primary Care Physician:  Karie Schwalbe, MD   Reason for Procedure:   RUQ / upper abdominal pain, dysphagia,  history of colon polyps  Plan:    EGD and colonoscopy      HPI: Benjamin Cortez is a 78 y.o. male  here for EGD and colonoscopy - he has had some upper abdominal discomfort ongoing, often worse with eating. Endorses intermittent dysphagia to pills and solids. EGD to further evaluate. On omeprazole 20mg . Last EGD 2013. Also with history of colon polyps, had 7 adenomas removed 04/2020. Here for surveillance.   Patient denies any bowel symptoms at this time. No family history of colon cancer known. Otherwise feels well without any cardiopulmonary symptoms.   I have discussed risks / benefits of anesthesia and endoscopic procedure with Benjamin Cortez and they wish to proceed with the exams as outlined today.    Past Medical History:  Diagnosis Date   Allergic rhinitis    Allergy    seasonal allergies-on meds   Arthritis    generalized   Asthmatic bronchitis 1997   controlled with meds-change in work mold exposure-hx of   Attention deficit disorder    Meds in past--stopped with retirement   Barrett's esophagus    Bladder cancer (HCC) 2018   GERD (gastroesophageal reflux disease)    on meds   Hepatitis    "i had hepatitis when i was 16"   History of colonic polyps    Benjamin Cortez   History of kidney stones 1970's   stone basket extraction   Hypertension    on meds   Ocular migraine    Plantar fasciitis 2018   Pneumonia    PONV (postoperative nausea and vomiting)    Vitamin D deficiency     Past Surgical History:  Procedure Laterality Date   ANTERIOR CERVICAL DECOMP/DISCECTOMY FUSION N/A 08/14/2019   Procedure: ANTERIOR CERVICAL DECOMPRESSION FUSION CERVICAL 4-5 WITH INSTRUMENTATION AND ALLOGRAFT;  Surgeon: Estill Bamberg, MD;  Location: [facility] OR;  Service: Orthopedics;  Laterality: N/A;   APPENDECTOMY  1959   CHOLECYSTECTOMY  2005    COLONOSCOPY  2018   SA-MAC-suprep (ade)-polyps   CYSTOSCOPY WITH RETROGRADE PYELOGRAM, URETEROSCOPY AND STENT PLACEMENT Right 09/07/2016   Procedure: CYSTOSCOPY WITH RIGHT RETROGRADE PYELOGRAM;  Surgeon: Sebastian Ache, MD;  Location: [facility];  Service: Urology;  Laterality: Right;   KIDNEY STONE SURGERY  1970's   Basket extraction   POLYPECTOMY     TONSILLECTOMY  1959   TRANSURETHRAL RESECTION OF BLADDER TUMOR N/A 09/07/2016   Procedure: TRANSURETHRAL RESECTION OF BLADDER TUMOR (TURBT);  Surgeon: Sebastian Ache, MD;  Location: [facility];  Service: Urology;  Laterality: N/A;    Prior to Admission medications   Medication Sig Start Date End Date Taking? Authorizing Provider  Alpha-Lipoic Acid 300 MG TABS Take by mouth.   Yes [provider]  gabapentin (NEURONTIN) 100 MG capsule 1 capsules in the am  and 2 at night. 03/27/23  Yes Karie Schwalbe, MD  losartan-hydrochlorothiazide [facility]) 50-12.5 MG tablet TAKE 1/2 TABLET BY MOUTH DAILY 04/26/23  Yes Karie Schwalbe, MD  Misc Natural Products (GLUCOSAMINE CHOND DOUBLE STR PO) Take by mouth.   Yes [provider]  Multiple Vitamin (MULTIVITAMIN) tablet Take 1 tablet by mouth daily.   Yes [provider]  omeprazole (PRILOSEC) 20 MG capsule TAKE 1 CAPSULE BY MOUTH EVERY DAY 03/27/23  Yes Tillman Abide I, MD  fluocinonide cream (LIDEX) 0.05 %  Apply 1 Application topically 2 (two) times daily as needed. 03/17/21   [provider]  methylcellulose (CITRUCEL) oral powder Take 1 packet by mouth daily. Patient not taking: Reported on 05/03/2023 08/23/22   Benancio Deeds, MD  phenylephrine (,USE FOR PREPARATION-H,) 0.25 % suppository Place 1 suppository rectally as needed for hemorrhoids. 08/23/22   Kaliyan Osbourn, Willaim Rayas, MD    Current Outpatient Medications  Medication Sig Dispense Refill   Alpha-Lipoic Acid 300 MG TABS Take by mouth.     gabapentin (NEURONTIN) 100 MG  capsule 1 capsules in the am  and 2 at night. 1 capsule 0   losartan-hydrochlorothiazide (HYZAAR) 50-12.5 MG tablet TAKE 1/2 TABLET BY MOUTH DAILY 45 tablet 3   Misc Natural Products (GLUCOSAMINE CHOND DOUBLE STR PO) Take by mouth.     Multiple Vitamin (MULTIVITAMIN) tablet Take 1 tablet by mouth daily.     omeprazole (PRILOSEC) 20 MG capsule TAKE 1 CAPSULE BY MOUTH EVERY DAY 90 capsule 3   fluocinonide cream (LIDEX) 0.05 % Apply 1 Application topically 2 (two) times daily as needed.     methylcellulose (CITRUCEL) oral powder Take 1 packet by mouth daily. (Patient not taking: Reported on 05/03/2023)     phenylephrine (,USE FOR PREPARATION-H,) 0.25 % suppository Place 1 suppository rectally as needed for hemorrhoids.     Current Facility-Administered Medications  Medication Dose Route Frequency Provider Last Rate Last Admin   0.9 %  sodium chloride infusion  500 mL Intravenous Once Elson Ulbrich, Willaim Rayas, MD        Allergies as of 05/03/2023 - Review Complete 05/03/2023  Allergen Reaction Noted   Penicillins Rash    Tetracycline     Codeine Nausea Only     Family History  Problem Relation Age of Onset   Thyroid disease Mother    Dementia Mother    Leukemia Father    Colon cancer Neg Hx    Stomach cancer Neg Hx    Esophageal cancer Neg Hx    Rectal cancer Neg Hx    Prostate cancer Neg Hx    Pancreatic cancer Neg Hx     Social History   Socioeconomic History   Marital status: Married    Spouse name: Olegario Messier   Number of children: 2   Years of education: 12+   Highest education level: Associate degree: occupational, Scientist, product/process development, or vocational program  Occupational History   Occupation: Surveyor, minerals- Retired    Comment: retired 2014  Tobacco Use   Smoking status: Former    Current packs/day: 0.00    Average packs/day: 1 pack/day for 16.0 years (16.0 ttl pk-yrs)    Types: Cigarettes    Start date: 02/07/1961    Quit date: 02/07/1977    Years since quitting: 46.2    Passive  exposure: Past   Smokeless tobacco: Never  Vaping Use   Vaping status: Never Used  Substance and Sexual Activity   Alcohol use: Not Currently    Comment: rarely   Drug use: No   Sexual activity: Yes  Other Topics Concern   Not on file  Social History Narrative   Lives with wife   Caffeine use:tea/coffee daily   Right handed   Has living will   Would want wife to make health care decisions-- son [name] is alternate   Would accept resuscitation attempts   Not sure about tube feeds-- not if cognitively unaware   Social Drivers of Health   Financial Resource Strain: Low Risk  (07/04/2022)  Overall Financial Resource Strain (CARDIA)    Difficulty of Paying Living Expenses: Not hard at all  Food Insecurity: No Food Insecurity (07/04/2022)   Hunger Vital Sign    Worried About Running Out of Food in the Last Year: Never true    Ran Out of Food in the Last Year: Never true  Transportation Needs: No Transportation Needs (07/04/2022)   PRAPARE - Administrator, Civil Service (Medical): No    Lack of Transportation (Non-Medical): No  Physical Activity: Insufficiently Active (07/04/2022)   Exercise Vital Sign    Days of Exercise per Week: 3 days    Minutes of Exercise per Session: 30 min  Stress: No Stress Concern Present (07/04/2022)   [employer] of Occupational Health - Occupational Stress Questionnaire    Feeling of Stress : Not at all  Social Connections: Moderately Integrated (07/04/2022)   Social Connection and Isolation Panel [NHANES]    Frequency of Communication with Friends and Family: Once a week    Frequency of Social Gatherings with Friends and Family: Once a week    Attends Religious Services: More than 4 times per year    Active Member of [employer] or Organizations: Yes    Attends Engineer, structural: More than 4 times per year    Marital Status: Married  Catering manager Violence: Not on file    Review of Systems: All other review of systems  negative except as mentioned in the HPI.  Physical Exam: Vital signs BP 132/71   Pulse (!) 58   Temp 97.6 F (36.4 C)   Resp 14   Ht 5' 6.5" (1.689 m)   Wt 220 lb (99.8 kg)   SpO2 99%   BMI 34.98 kg/m   General:   Alert,  Well-developed, pleasant and cooperative in NAD Lungs:  Clear throughout to auscultation.   Heart:  Regular rate and rhythm Abdomen:  Soft, nontender and nondistended.   Neuro/Psych:  Alert and cooperative. Normal mood and affect. A and O x 3  Harlin Rain, MD [facility]

## 2023-05-03 NOTE — Patient Instructions (Signed)

## 2023-05-03 NOTE — Progress Notes (Signed)
 Pt sedate, gd SR's, VSS, report to RN

## 2023-05-03 NOTE — Op Note (Signed)
 [facility] Patient Name: Raywood Wailes Procedure Date: 05/03/2023 1:01 PM MRN: [id number] Endoscopist: Viviann Spare P. Adela Lank , MD, [id number] Age: 78 Referring MD:  Date of Birth: 1945/03/13 Gender: Male Account #: [id number] Procedure:                Upper GI endoscopy Indications:              Upper abdominal pain, Dysphagia - on omeprazole Medicines:                Monitored Anesthesia Care Procedure:                Pre-Anesthesia Assessment:                           - Prior to the procedure, a History and Physical                            was performed, and patient medications and                            allergies were reviewed. The patient's tolerance of                            previous anesthesia was also reviewed. The risks                            and benefits of the procedure and the sedation                            options and risks were discussed with the patient.                            All questions were answered, and informed consent                            was obtained. Prior Anticoagulants: The patient has                            taken no anticoagulant or antiplatelet agents. ASA                            Grade Assessment: II - A patient with mild systemic                            disease. After reviewing the risks and benefits,                            the patient was deemed in satisfactory condition to                            undergo the procedure.                           After obtaining informed consent, the endoscope was  passed under direct vision. Throughout the                            procedure, the patient's blood pressure, pulse, and                            oxygen saturations were monitored continuously. The                            GIF [id number] #[id number] was introduced through the                            mouth, and advanced to the second part of duodenum.                            The  upper GI endoscopy was accomplished without                            difficulty. The patient tolerated the procedure                            well. Scope In: Scope Out: Findings:                 Esophagogastric landmarks were identified: the                            Z-line was found at 36 cm, the gastroesophageal                            junction was found at 36 cm and the upper extent of                            the gastric folds was found at 39 cm from the                            incisors.                           A 3 cm hiatal hernia was present.                           The exam of the esophagus was otherwise normal. No                            focal stenosis / stricture appreciated.                           A guidewire was placed and the scope was withdrawn.                            Empiric dilation was performed in the entire                            esophagus  with a Savary dilator with mild                            resistance at 17 mm. Relook endoscopy showed no                            mucosal wrent.                           A single 3 to 4 mm sessile polyp was found in the                            cardia. The polyp was removed with a cold biopsy                            forceps. Resection and retrieval were complete.                           Mildly erythematous mucosa was found in the gastric                            fundus and in the gastric body.                           The exam of the stomach was otherwise normal.                           Biopsies were taken with a cold forceps for                            Helicobacter pylori testing.                           The examined duodenum was normal. Complications:            No immediate complications. Estimated blood loss:                            Minimal. Estimated Blood Loss:     Estimated blood loss was minimal. Impression:               - Esophagogastric landmarks identified.                            - 3 cm hiatal hernia.                           - Normal esophagus otherwise - empiric dilation                            performed to 17mm.                           - A single gastric polyp. Resected and retrieved.                           -  Erythematous mucosa in the gastric fundus and                            gastric body.                           - Normal stomach otherwise - biopsies taken to rule                            out H pylori                           - Normal examined duodenum. Recommendation:           - Patient has a contact number available for                            emergencies. The signs and symptoms of potential                            delayed complications were discussed with the                            patient. Return to normal activities tomorrow.                            Written discharge instructions were provided to the                            patient.                           - Resume previous diet.                           - Continue present medications.                           - Await pathology results and course post dilation Jumana Paccione P. Adela Lank, MD 05/03/2023 1:58:42 PM This report has been signed electronically.

## 2023-05-03 NOTE — Progress Notes (Signed)
 Pt complaining of a sore mouth. A cut was seen on the underside of the tongue. CRNA and MD notified. Site not bleeding at this time. Advised patient he could gargle with warm salt water at home.

## 2023-05-03 NOTE — Progress Notes (Signed)
 Called to room to assist during endoscopic procedure.  Patient ID and intended procedure confirmed with present staff. Received instructions for my participation in the procedure from the performing physician.

## 2023-05-04 ENCOUNTER — Telehealth: Payer: Self-pay

## 2023-05-04 NOTE — Telephone Encounter (Signed)
  Follow up Call-     05/03/2023   12:49 PM  Call back number  Post procedure Call Back phone  # [phone]  Permission to leave phone message Yes     Patient questions:  Do you have a fever, pain , or abdominal swelling? No. Pain Score  0 *  Have you tolerated food without any problems? Yes.    Have you been able to return to your normal activities? Yes.    Do you have any questions about your discharge instructions: Diet   No. Medications  No. Follow up visit  No.  Do you have questions or concerns about your Care? No.  Actions: * If pain score is 4 or above: No action needed, pain <4.

## 2023-05-08 LAB — SURGICAL PATHOLOGY

## 2023-05-15 ENCOUNTER — Encounter: Payer: Self-pay | Admitting: Gastroenterology

## 2023-05-29 DIAGNOSIS — L820 Inflamed seborrheic keratosis: Secondary | ICD-10-CM | POA: Diagnosis not present

## 2023-05-29 DIAGNOSIS — L821 Other seborrheic keratosis: Secondary | ICD-10-CM | POA: Diagnosis not present

## 2023-05-29 DIAGNOSIS — L570 Actinic keratosis: Secondary | ICD-10-CM | POA: Diagnosis not present

## 2023-06-19 ENCOUNTER — Ambulatory Visit (INDEPENDENT_AMBULATORY_CARE_PROVIDER_SITE_OTHER): Admitting: Internal Medicine

## 2023-06-19 ENCOUNTER — Encounter: Payer: Self-pay | Admitting: Internal Medicine

## 2023-06-19 VITALS — BP 138/88 | HR 68 | Temp 98.2°F | Ht 66.5 in | Wt 225.0 lb

## 2023-06-19 DIAGNOSIS — H93A2 Pulsatile tinnitus, left ear: Secondary | ICD-10-CM | POA: Insufficient documentation

## 2023-06-19 NOTE — Progress Notes (Signed)
 Subjective:    Patient ID: Benjamin Cortez, male    DOB: 25-Mar-1945, 78 y.o.   MRN: [id number]  HPI Here due to noise in his ears  At night---when it is quiet, he hears a swishing like a heartbeat in L>R ear Going on for 3 weeks No recent illness Had left lower molar removed since the sound His background tinnitus is now more noticeable (long standing)  Current Outpatient Medications on File Prior to Visit  Medication Sig Dispense Refill   Alpha-Lipoic Acid 300 MG TABS Take by mouth.     fluocinonide cream (LIDEX) 0.05 % Apply 1 Application topically 2 (two) times daily as needed.     gabapentin  (NEURONTIN ) 100 MG capsule 1 capsules in the am  and 2 at night. 1 capsule 0   losartan -hydrochlorothiazide  (HYZAAR) 50-12.5 MG tablet TAKE 1/2 TABLET BY MOUTH DAILY 45 tablet 3   Misc Natural Products (GLUCOSAMINE CHOND DOUBLE STR PO) Take by mouth.     Multiple Vitamin (MULTIVITAMIN) tablet Take 1 tablet by mouth daily.     omeprazole  (PRILOSEC) 20 MG capsule TAKE 1 CAPSULE BY MOUTH EVERY DAY 90 capsule 3   phenylephrine  (,USE FOR PREPARATION-H,) 0.25 % suppository Place 1 suppository rectally as needed for hemorrhoids.     No current facility-administered medications on file prior to visit.    Allergies  Allergen Reactions   Penicillins Rash    As a child--rash   Tetracycline     Rash, blisters   Codeine Nausea Only    Very disoriented    Past Medical History:  Diagnosis Date   Allergic rhinitis    Allergy    seasonal allergies-on meds   Arthritis    generalized   Asthmatic bronchitis 1997   controlled with meds-change in work mold exposure-hx of   Attention deficit disorder    Meds in past--stopped with retirement   Barrett's esophagus    Bladder cancer (HCC) 2018   GERD (gastroesophageal reflux disease)    on meds   Hepatitis    "i had hepatitis when i was 16"   History of colonic polyps    [name]   History of kidney stones 1970's   stone basket extraction    Hypertension    on meds   Ocular migraine    Plantar fasciitis 2018   Pneumonia    PONV (postoperative nausea and vomiting)    Vitamin D deficiency     Past Surgical History:  Procedure Laterality Date   ANTERIOR CERVICAL DECOMP/DISCECTOMY FUSION N/A 08/14/2019   Procedure: ANTERIOR CERVICAL DECOMPRESSION FUSION CERVICAL 4-5 WITH INSTRUMENTATION AND ALLOGRAFT;  Surgeon: Virl Grimes, MD;  Location: [facility];  Service: Orthopedics;  Laterality: N/A;   APPENDECTOMY  1959   CHOLECYSTECTOMY  2005   COLONOSCOPY  2018   SA-MAC-suprep (ade)-polyps   CYSTOSCOPY WITH RETROGRADE PYELOGRAM, URETEROSCOPY AND STENT PLACEMENT Right 09/07/2016   Procedure: CYSTOSCOPY WITH RIGHT RETROGRADE PYELOGRAM;  Surgeon: Osborn Blaze, MD;  Location: [facility];  Service: Urology;  Laterality: Right;   KIDNEY STONE SURGERY  1970's   Basket extraction   POLYPECTOMY     TONSILLECTOMY  1959   TRANSURETHRAL RESECTION OF BLADDER TUMOR N/A 09/07/2016   Procedure: TRANSURETHRAL RESECTION OF BLADDER TUMOR (TURBT);  Surgeon: Osborn Blaze, MD;  Location: [facility];  Service: Urology;  Laterality: N/A;    Family History  Problem Relation Age of Onset   Thyroid  disease Mother    Dementia Mother    Leukemia Father  Colon cancer Neg Hx    Stomach cancer Neg Hx    Esophageal cancer Neg Hx    Rectal cancer Neg Hx    Prostate cancer Neg Hx    Pancreatic cancer Neg Hx     Social History   Socioeconomic History   Marital status: Married    Spouse name: Thersia Flax   Number of children: 2   Years of education: 12+   Highest education level: Associate degree: occupational, Scientist, product/process development, or vocational program  Occupational History   Occupation: Surveyor, minerals- Retired    Comment: retired 2014  Tobacco Use   Smoking status: Former    Current packs/day: 0.00    Average packs/day: 1 pack/day for 16.0 years (16.0 ttl pk-yrs)    Types: Cigarettes    Start date: 02/07/1961    Quit date:  02/07/1977    Years since quitting: 46.3    Passive exposure: Past   Smokeless tobacco: Never  Vaping Use   Vaping status: Never Used  Substance and Sexual Activity   Alcohol use: Not Currently    Comment: rarely   Drug use: No   Sexual activity: Yes  Other Topics Concern   Not on file  Social History Narrative   Lives with wife   Caffeine use:tea/coffee daily   Right handed   Has living will   Would want wife to make health care decisions-- son Naoma Bacca is alternate   Would accept resuscitation attempts   Not sure about tube feeds-- not if cognitively unaware   Social Drivers of Health   Financial Resource Strain: Low Risk  (06/18/2023)   Overall Financial Resource Strain (CARDIA)    Difficulty of Paying Living Expenses: Not hard at all  Food Insecurity: No Food Insecurity (06/18/2023)   Hunger Vital Sign    Worried About Running Out of Food in the Last Year: Never true    Ran Out of Food in the Last Year: Never true  Transportation Needs: No Transportation Needs (06/18/2023)   PRAPARE - Administrator, Civil Service (Medical): No    Lack of Transportation (Non-Medical): No  Physical Activity: Insufficiently Active (06/18/2023)   Exercise Vital Sign    Days of Exercise per Week: 3 days    Minutes of Exercise per Session: 30 min  Stress: No Stress Concern Present (06/18/2023)   [employer] of Occupational Health - Occupational Stress Questionnaire    Feeling of Stress : Only a little  Social Connections: Socially Integrated (06/18/2023)   Social Connection and Isolation Panel [NHANES]    Frequency of Communication with Friends and Family: Twice a week    Frequency of Social Gatherings with Friends and Family: Once a week    Attends Religious Services: More than 4 times per year    Active Member of [employer] or Organizations: Yes    Attends Engineer, structural: More than 4 times per year    Marital Status: Married  Catering manager Violence: Not on file    Review of Systems No recent travel Balance is off some--but no vertigo    Objective:   Physical Exam Constitutional:      Appearance: Normal appearance.  HENT:     Head:     Comments: No TMJ tenderness    Ears:     Comments: Both TMs look normal--no obvious fluid and no inflammation    Mouth/Throat:     Pharynx: No oropharyngeal exudate or posterior oropharyngeal erythema.     Comments: No inflammation Neurological:  Mental Status: He is alert.            Assessment & Plan:

## 2023-06-19 NOTE — Assessment & Plan Note (Signed)
 Not sure why he has had sudden occurrence ---but it is not abnormal to hear bone conduction of artery through skull Reassured If worsens, should have ENT evaluation

## 2023-06-22 ENCOUNTER — Other Ambulatory Visit: Payer: Self-pay | Admitting: Internal Medicine

## 2023-06-22 MED ORDER — GABAPENTIN 100 MG PO CAPS: ORAL_CAPSULE | ORAL | 3 refills | Status: AC

## 2023-06-22 NOTE — Telephone Encounter (Signed)
 Copied from CRM [id number]. Topic: Clinical - Medication Refill >> Jun 22, 2023 11:56 AM Juleen Oakland F wrote: Medication: gabapentin  (NEURONTIN ) 100 MG capsule  Has the patient contacted their pharmacy? Yes (Agent: If no, request that the patient contact the pharmacy for the refill. If patient does not wish to contact the pharmacy document the reason why and proceed with request.) (Agent: If yes, when and what did the pharmacy advise?)  This is the patient's preferred pharmacy:   [id number] [place], [place] - [address] [place] [address] Phone: [phone] Fax: [phone]  Is this the correct pharmacy for this prescription? Yes If no, delete pharmacy and type the correct one.   Has the prescription been filled recently? Yes  Is the patient out of the medication? No  Has the patient been seen for an appointment in the last year OR does the patient have an upcoming appointment? Yes  Can we respond through MyChart? No  Agent: Please be advised that Rx refills may take up to 3 business days. We ask that you follow-up with your pharmacy.

## 2023-06-22 NOTE — Telephone Encounter (Signed)
 Rx sent electronically.

## 2023-10-06 ENCOUNTER — Ambulatory Visit: Payer: Self-pay

## 2023-10-06 NOTE — Telephone Encounter (Signed)
 Noted. Will see as scheduled.

## 2023-10-06 NOTE — Telephone Encounter (Signed)
 FYI Only or Action Required?: FYI only for provider.  Patient was last seen in primary care on 06/19/2023 by Benjamin Charlie FERNS, MD.  Called Nurse Triage reporting Breast Problem.  Symptoms began several days ago.  Interventions attempted: Nothing.  Symptoms are: unchanged.  Triage Disposition: See PCP Within 2 Weeks  Patient/caregiver understands and will follow disposition?: Yes       Copied from CRM [id number]. Topic: Clinical - Red Word Triage >> Oct 06, 2023  9:56 AM Berneda FALCON wrote: Red Word that prompted transfer to Nurse Triage: Pt states left nipple is sore and the left side of his chest is swollen. Noticed this as of Wednesday. Not sure if he was bitten by a bug or something.  He notes there is no redness or heat, just irritating when there is a shirt or anything, sore, and puffy around the nipple. No recent falls or traumas notes.  Pt did have covid 2 weeks ago. Reason for Disposition  [1] Breast pain AND [2] cause is not known  Answer Assessment - Initial Assessment Questions 1. SYMPTOM: What's the main symptom you're concerned about?  (e.g., lump, nipple discharge, pain, rash)     Puffy/soreness - all subcutaneous 2. LOCATION: Where is the sx located?     R side around the nipple area 3. ONSET: When did swelling  start?     Wednesday 4. PRIOR HISTORY: Do you have any history of prior problems with your breasts? (e.g., breast cancer, breast implant, fibrocystic breast disease)     N/a 5. CAUSE: What do you think is causing this symptom?     unknown 6. OTHER SYMPTOMS: Do you have any other symptoms? (e.g., breast pain, fever, nipple discharge, redness or rash)     denies 7. PREGNANCY-BREASTFEEDING: Is there any chance you are pregnant? When was your last menstrual period? Are you breastfeeding?     N/a  Protocols used: Breast Symptoms-A-AH

## 2023-10-10 ENCOUNTER — Ambulatory Visit (INDEPENDENT_AMBULATORY_CARE_PROVIDER_SITE_OTHER): Admitting: Family Medicine

## 2023-10-10 ENCOUNTER — Encounter: Payer: Self-pay | Admitting: Family Medicine

## 2023-10-10 VITALS — BP 130/70 | HR 59 | Temp 97.1°F | Ht 66.5 in | Wt 228.1 lb

## 2023-10-10 DIAGNOSIS — N62 Hypertrophy of breast: Secondary | ICD-10-CM | POA: Insufficient documentation

## 2023-10-10 LAB — CBC WITH DIFFERENTIAL/PLATELET
Basophils Absolute: 0 K/uL (ref 0.0–0.1)
Basophils Relative: 0.8 % (ref 0.0–3.0)
Eosinophils Absolute: 0.2 K/uL (ref 0.0–0.7)
Eosinophils Relative: 3.9 % (ref 0.0–5.0)
HCT: 42.8 % (ref 39.0–52.0)
Hemoglobin: 14.5 g/dL (ref 13.0–17.0)
Lymphocytes Relative: 18.8 % (ref 12.0–46.0)
Lymphs Abs: 0.8 K/uL (ref 0.7–4.0)
MCHC: 34 g/dL (ref 30.0–36.0)
MCV: 94.2 fl (ref 78.0–100.0)
Monocytes Absolute: 0.5 K/uL (ref 0.1–1.0)
Monocytes Relative: 11.7 % (ref 3.0–12.0)
Neutro Abs: 2.9 K/uL (ref 1.4–7.7)
Neutrophils Relative %: 64.8 % (ref 43.0–77.0)
Platelets: 235 K/uL (ref 150.0–400.0)
RBC: 4.54 Mil/uL (ref 4.22–5.81)
RDW: 12.9 % (ref 11.5–15.5)
WBC: 4.4 K/uL (ref 4.0–10.5)

## 2023-10-10 LAB — COMPREHENSIVE METABOLIC PANEL WITH GFR
ALT: 24 U/L (ref 0–53)
AST: 20 U/L (ref 0–37)
Albumin: 4 g/dL (ref 3.5–5.2)
Alkaline Phosphatase: 97 U/L (ref 39–117)
BUN: 16 mg/dL (ref 6–23)
CO2: 29 meq/L (ref 19–32)
Calcium: 8.9 mg/dL (ref 8.4–10.5)
Chloride: 105 meq/L (ref 96–112)
Creatinine, Ser: 1.14 mg/dL (ref 0.40–1.50)
GFR: 62.84 mL/min (ref >60.00)
Glucose, Bld: 106 mg/dL — ABNORMAL HIGH (ref 70–99)
Potassium: 4.4 meq/L (ref 3.5–5.1)
Sodium: 143 meq/L (ref 135–145)
Total Bilirubin: 0.7 mg/dL (ref 0.2–1.2)
Total Protein: 6.5 g/dL (ref 6.0–8.3)

## 2023-10-10 LAB — FOLLICLE STIMULATING HORMONE: FSH: 26 m[IU]/mL — ABNORMAL HIGH (ref 1.4–18.1)

## 2023-10-10 LAB — TSH: TSH: 1.5 u[IU]/mL (ref 0.35–5.50)

## 2023-10-10 LAB — LUTEINIZING HORMONE: LH: 16.31 m[IU]/mL (ref 3.10–34.60)

## 2023-10-10 NOTE — Assessment & Plan Note (Signed)
 Acute, unilateral No new medications or supplements, no additional symptoms except for remaining body ache from recent COVID. No family history of breast cancer but there is a family history of likely hypothyroidism. Will evaluate with labs including renal and liver function panel, testosterone , FSH/LH prolactin and thyroid . Given no focal mass, no clear need for urgent imaging.  If persists we can look into it further.  Question if related to recent COVID.  No lymphadenopathy noted.  Return precautions reviewed with patient in detail.

## 2023-10-10 NOTE — Progress Notes (Signed)
 Patient ID: Benjamin Cortez, male    DOB: Jul 08, 1945, 78 y.o.   MRN: [id number]  This visit was conducted in person.  BP 130/70   Pulse (!) 59   Temp (!) 97.1 F (36.2 C) (Temporal)   Ht 5' 6.5 (1.689 m)   Wt 228 lb 2 oz (103.5 kg)   SpO2 98%   BMI 36.27 kg/m    CC:  Chief Complaint  Patient presents with   Breast Tenderness    Right x 1 week Covid 2 weeks ago    Subjective:   HPI: Benjamin Cortez is a 78 y.o. male presenting on 10/10/2023 for Breast Tenderness (Right x 1 week/Covid 2 weeks ago)   Right breast tenderness x 1 week.SABRA at nipple and around the nipple.  Feels like right breast has been  swollen slightly at nipple.  No nipple discharge.   Had COVID 2 week ago. Did not take any antiviral.   No new medications. No new supplement.   No family history of breast cancer.    Mother with thyroid  issues.  Relevant past medical, surgical, family and social history reviewed and updated as indicated. Interim medical history since our last visit reviewed. Allergies and medications reviewed and updated. Outpatient Medications Prior to Visit  Medication Sig Dispense Refill   Alpha-Lipoic Acid 300 MG TABS Take 1 tablet by mouth daily.     fluocinonide cream (LIDEX) 0.05 % Apply 1 Application topically 2 (two) times daily as needed.     gabapentin  (NEURONTIN ) 100 MG capsule 1 capsules in the am  and 2 at night. 270 capsule 3   losartan -hydrochlorothiazide  (HYZAAR) 50-12.5 MG tablet TAKE 1/2 TABLET BY MOUTH DAILY 45 tablet 3   Misc Natural Products (GLUCOSAMINE CHOND DOUBLE STR PO) Take by mouth.     Multiple Vitamin (MULTIVITAMIN) tablet Take 1 tablet by mouth daily.     omeprazole  (PRILOSEC) 20 MG capsule TAKE 1 CAPSULE BY MOUTH EVERY DAY 90 capsule 3   phenylephrine  (,USE FOR PREPARATION-H,) 0.25 % suppository Place 1 suppository rectally as needed for hemorrhoids.     Alpha-Lipoic Acid 300 MG TABS Take by mouth.     No facility-administered medications prior to visit.      Per HPI unless specifically indicated in ROS section below Review of Systems  Constitutional:  Negative for fatigue and fever.  HENT:  Negative for ear pain.   Eyes:  Negative for pain.  Respiratory:  Negative for cough and shortness of breath.   Cardiovascular:  Negative for chest pain, palpitations and leg swelling.  Gastrointestinal:  Negative for abdominal pain.  Genitourinary:  Negative for dysuria.  Musculoskeletal:  Negative for arthralgias.  Neurological:  Negative for syncope, light-headedness and headaches.  Psychiatric/Behavioral:  Negative for dysphoric mood.    Objective:  BP 130/70   Pulse (!) 59   Temp (!) 97.1 F (36.2 C) (Temporal)   Ht 5' 6.5 (1.689 m)   Wt 228 lb 2 oz (103.5 kg)   SpO2 98%   BMI 36.27 kg/m   Wt Readings from Last 3 Encounters:  10/10/23 228 lb 2 oz (103.5 kg)  06/19/23 225 lb (102.1 kg)  05/03/23 220 lb (99.8 kg)      Physical Exam Vitals reviewed.  Constitutional:      Appearance: He is well-developed.  HENT:     Head: Normocephalic.     Right Ear: Hearing normal.     Left Ear: Hearing normal.     Nose: Nose normal.  Neck:     Thyroid : No thyroid  mass or thyromegaly.     Vascular: No carotid bruit.     Trachea: Trachea normal.  Cardiovascular:     Rate and Rhythm: Normal rate and regular rhythm.     Pulses: Normal pulses.     Heart sounds: Heart sounds not distant. No murmur heard.    No friction rub. No gallop.     Comments: No peripheral edema Pulmonary:     Effort: Pulmonary effort is normal. No respiratory distress.     Breath sounds: Normal breath sounds.  Chest:  Breasts:    Breasts are asymmetrical.     Right: Swelling and tenderness present. No inverted nipple, nipple discharge or skin change.     Comments: Soreness and mild swelling under right nipple, no focal mass Lymphadenopathy:     Upper Body:     Right upper body: No supraclavicular, axillary or pectoral adenopathy.     Left upper body: No  supraclavicular, axillary or pectoral adenopathy.  Skin:    General: Skin is warm and dry.     Findings: No rash.  Psychiatric:        Speech: Speech normal.        Behavior: Behavior normal.        Thought Content: Thought content normal.       Results for orders placed or performed in visit on 05/03/23  Surgical pathology ([facility])   Collection Time: 05/03/23 12:00 AM  Result Value Ref Range   SURGICAL PATHOLOGY      SURGICAL PATHOLOGY [facility] [address], [address] [place], [place] [address] Telephone [phone] or [phone] Fax [phone]  REPORT OF SURGICAL PATHOLOGY   Accession #: [id number] Patient Name: Benjamin Cortez, Benjamin Cortez Visit # : [id number]  MRN: [id number] Physician: Leigh Standing DOB/Age 09/19/1945 (Age: 70) Gender: M Collected Date: 05/03/2023 Received Date: 05/05/2023  FINAL DIAGNOSIS       1. Surgical [P], gastric biopsy :       -  ANTRAL MUCOSA WITH CHEMICAL/REACTIVE CHANGE.      -  OXYNTIC MUCOSA WITH DILATED GLANDS CONSISTENT WITH FUNDIC GLAND POLYPS.      -  NO HELICOBACTER PYLORI ORGANISMS IDENTIFIED ON H&E STAINED SLIDE.       2. Surgical [P], gastric polyp x 1, polyp (1) :       -  ANTRAL/OXYNTIC MUCOSA WITH POLYPOID FOVEOLAR HYPERPLASIA AND FOCAL DILATED      OXYNTIC GLANDS, I.E.OVERLAPPING FEATURES OF A HYPERPLASTIC POLYP (FAVORED) AND      FUNDIC GLAND POLYP.      -  NEGATIVE FOR DYSPLASIA.       3. Surgical [P],  colon, cecum polyp x 2, transverse polyp x 3, hepatic flexure polyp x 1, polyp (6) :       -  FRAGMENT OF COLONIC MUCOSA WITH EARLY ADENOMATOUS CHANGE.      -  SEPARATE FRAGMENTS OF COLONIC MUCOSA WITH MILD HYPERPLASTIC CHANGE AND A      SMALL LYMPHOID AGGREGATE.       ELECTRONIC SIGNATURE : Legolvan Do, Mark, Pathologist, Electronic Signature  MICROSCOPIC DESCRIPTION  CASE COMMENTS STAINS USED IN DIAGNOSIS: H&E H&E H&E H&E-2    CLINICAL HISTORY  SPECIMEN(S) OBTAINED 1.  Surgical [P], Gastric Biopsy 2. Surgical [P], Gastric Polyp X 1, Polyp (1) 3. Surgical [P], Colon, Cecum Polyp X 2, Transverse Polyp X 3, Hepatic Flexure Polyp X 1, Polyp (6)  SPECIMEN COMMENTS: 1. RUQ pain;  dysphagia, unspecified type; history of colon polyps; gastroesophageal reflux disease, unspecified whether esophagitis present; benign neoplasm of cecum; benign neoplasm of transverse colon; benign neoplasm of hepatic flexure of colon SPECIMEN CLINICAL INFORMATION: 1. R/O H.pylo ri 2. R/O other adenoma 3. R/O adenoma    Gross Description 1. Received in formalin are tan, soft tissue fragments that are submitted in toto.Number: 4, Size: 0.3 cm smallest to 0.4 cm largest, (1B) ( TA ) 2. Received in formalin are tan, soft tissue fragments that are submitted in toto.Number: 3, Size: 0.3 cm smallest to 0.5 cm largest, (1B) ( TA ) 3. Received in formalin are tan, soft tissue fragments that are submitted in toto.Number: 4, Size: 0.2 cm smallest to 1.2 cm largest, (1B) ( TA )        Report signed out from the following location(s) [place].  [facility] [address] [place], [place] [address] CLIA #: [id number]  [facility] [address] [place], [place] [address] CLIA #: [id number]     Assessment and Plan  Gynecomastia, male Assessment & Plan: Acute, unilateral No new medications or supplements, no additional symptoms except for remaining body ache from recent COVID. No family history of breast cancer but there is a family history of likely hypothyroidism. Will evaluate with labs including renal and liver function panel, testosterone , FSH/LH prolactin and thyroid . Given no focal mass, no clear need for urgent imaging.  If persists we can look into it further.  Question if related to recent COVID.  No lymphadenopathy noted.  Return precautions reviewed with patient in detail.  Orders: -     Comprehensive metabolic panel with GFR -     CBC  with Differential/Platelet -     TSH -     Testosterone , Free, Total, SHBG -     Prolactin -     Follicle stimulating hormone -     Luteinizing hormone    No follow-ups on file.   Greig Ring, MD

## 2023-10-11 ENCOUNTER — Ambulatory Visit: Payer: Self-pay | Admitting: Family Medicine

## 2023-10-11 LAB — PROLACTIN: Prolactin: 4.9 ng/mL (ref 2.0–18.0)

## 2023-10-15 ENCOUNTER — Encounter: Payer: Self-pay | Admitting: Family Medicine

## 2023-10-15 DIAGNOSIS — N62 Hypertrophy of breast: Secondary | ICD-10-CM

## 2023-10-18 LAB — TESTOSTERONE, FREE, TOTAL, SHBG
Sex Hormone Binding: 48 nmol/L (ref 19.3–76.4)
Testosterone, Free: 2 pg/mL — ABNORMAL LOW (ref 6.6–18.1)
Testosterone: 306 ng/dL (ref 264–916)

## 2023-10-26 ENCOUNTER — Ambulatory Visit: Payer: Self-pay | Admitting: Family Medicine

## 2023-10-26 ENCOUNTER — Other Ambulatory Visit (INDEPENDENT_AMBULATORY_CARE_PROVIDER_SITE_OTHER)

## 2023-10-26 DIAGNOSIS — N62 Hypertrophy of breast: Secondary | ICD-10-CM

## 2023-10-26 LAB — TESTOSTERONE: Testosterone: 136.73 ng/dL — ABNORMAL LOW (ref 300.00–890.00)

## 2023-10-31 DIAGNOSIS — N62 Hypertrophy of breast: Secondary | ICD-10-CM | POA: Diagnosis not present

## 2023-10-31 DIAGNOSIS — E291 Testicular hypofunction: Secondary | ICD-10-CM | POA: Diagnosis not present

## 2023-11-07 ENCOUNTER — Other Ambulatory Visit: Payer: Self-pay | Admitting: Nurse Practitioner

## 2023-11-07 DIAGNOSIS — N62 Hypertrophy of breast: Secondary | ICD-10-CM

## 2023-11-14 ENCOUNTER — Ambulatory Visit
Admission: RE | Admit: 2023-11-14 | Discharge: 2023-11-14 | Disposition: A | Discharge status: Home / Self Care | Payer: Self-pay | Source: Ambulatory Visit | Attending: Nurse Practitioner | Admitting: Nurse Practitioner

## 2023-11-14 ENCOUNTER — Ambulatory Visit: Payer: Self-pay

## 2023-11-14 ENCOUNTER — Encounter: Payer: Self-pay | Admitting: Family Medicine

## 2023-11-14 DIAGNOSIS — N62 Hypertrophy of breast: Secondary | ICD-10-CM

## 2023-11-14 DIAGNOSIS — R928 Other abnormal and inconclusive findings on diagnostic imaging of breast: Secondary | ICD-10-CM | POA: Diagnosis not present

## 2023-12-07 DIAGNOSIS — H25811 Combined forms of age-related cataract, right eye: Secondary | ICD-10-CM | POA: Diagnosis not present

## 2023-12-07 DIAGNOSIS — H26492 Other secondary cataract, left eye: Secondary | ICD-10-CM | POA: Diagnosis not present

## 2023-12-07 DIAGNOSIS — H31092 Other chorioretinal scars, left eye: Secondary | ICD-10-CM | POA: Diagnosis not present

## 2023-12-07 DIAGNOSIS — H35373 Puckering of macula, bilateral: Secondary | ICD-10-CM | POA: Diagnosis not present

## 2023-12-22 DIAGNOSIS — R3915 Urgency of urination: Secondary | ICD-10-CM | POA: Diagnosis not present

## 2023-12-22 DIAGNOSIS — C673 Malignant neoplasm of anterior wall of bladder: Secondary | ICD-10-CM | POA: Diagnosis not present

## 2023-12-22 DIAGNOSIS — N62 Hypertrophy of breast: Secondary | ICD-10-CM | POA: Diagnosis not present

## 2024-01-15 ENCOUNTER — Encounter (HOSPITAL_BASED_OUTPATIENT_CLINIC_OR_DEPARTMENT_OTHER): Payer: Self-pay | Admitting: Emergency Medicine

## 2024-01-15 ENCOUNTER — Emergency Department (HOSPITAL_BASED_OUTPATIENT_CLINIC_OR_DEPARTMENT_OTHER)
Admission: EM | Admit: 2024-01-15 | Discharge: 2024-01-15 | Disposition: A | Discharge status: Home / Self Care | Attending: Emergency Medicine | Admitting: Emergency Medicine

## 2024-01-15 ENCOUNTER — Ambulatory Visit: Payer: Self-pay

## 2024-01-15 ENCOUNTER — Emergency Department (HOSPITAL_BASED_OUTPATIENT_CLINIC_OR_DEPARTMENT_OTHER)

## 2024-01-15 ENCOUNTER — Other Ambulatory Visit: Payer: Self-pay

## 2024-01-15 ENCOUNTER — Emergency Department (HOSPITAL_COMMUNITY)

## 2024-01-15 DIAGNOSIS — G43809 Other migraine, not intractable, without status migrainosus: Secondary | ICD-10-CM | POA: Insufficient documentation

## 2024-01-15 DIAGNOSIS — R29818 Other symptoms and signs involving the nervous system: Secondary | ICD-10-CM | POA: Insufficient documentation

## 2024-01-15 DIAGNOSIS — R2681 Unsteadiness on feet: Secondary | ICD-10-CM | POA: Diagnosis not present

## 2024-01-15 DIAGNOSIS — R42 Dizziness and giddiness: Secondary | ICD-10-CM | POA: Diagnosis present

## 2024-01-15 DIAGNOSIS — R9431 Abnormal electrocardiogram [ECG] [EKG]: Secondary | ICD-10-CM | POA: Insufficient documentation

## 2024-01-15 LAB — URINALYSIS, ROUTINE W REFLEX MICROSCOPIC
Bilirubin Urine: NEGATIVE
Glucose, UA: NEGATIVE mg/dL
Hgb urine dipstick: NEGATIVE
Ketones, ur: NEGATIVE mg/dL
Leukocytes,Ua: NEGATIVE
Nitrite: NEGATIVE
Protein, ur: NEGATIVE mg/dL
Specific Gravity, Urine: 1.013 (ref 1.005–1.030)
pH: 6 (ref 5.0–8.0)

## 2024-01-15 LAB — CBC
HCT: 41.1 % (ref 39.0–52.0)
Hemoglobin: 14.4 g/dL (ref 13.0–17.0)
MCH: 32.3 pg (ref 26.0–34.0)
MCHC: 35 g/dL (ref 30.0–36.0)
MCV: 92.2 fL (ref 80.0–100.0)
Platelets: 220 K/uL (ref 150–400)
RBC: 4.46 MIL/uL (ref 4.22–5.81)
RDW: 12.9 % (ref 11.5–15.5)
WBC: 4.6 K/uL (ref 4.0–10.5)
nRBC: 0 % (ref 0.0–0.2)

## 2024-01-15 LAB — COMPREHENSIVE METABOLIC PANEL WITH GFR
ALT: 39 U/L (ref 0–44)
AST: 40 U/L (ref 15–41)
Albumin: 4.2 g/dL (ref 3.5–5.0)
Alkaline Phosphatase: 110 U/L (ref 38–126)
Anion gap: 10 (ref 5–15)
BUN: 16 mg/dL (ref 8–23)
CO2: 27 mmol/L (ref 22–32)
Calcium: 9.8 mg/dL (ref 8.9–10.3)
Chloride: 104 mmol/L (ref 98–111)
Creatinine, Ser: 1.23 mg/dL (ref 0.61–1.24)
GFR, Estimated: >60 mL/min (ref >60)
Glucose, Bld: 106 mg/dL — ABNORMAL HIGH (ref 70–99)
Potassium: 3.9 mmol/L (ref 3.5–5.1)
Sodium: 141 mmol/L (ref 135–145)
Total Bilirubin: 0.6 mg/dL (ref 0.0–1.2)
Total Protein: 7 g/dL (ref 6.5–8.1)

## 2024-01-15 LAB — CBG MONITORING, ED: Glucose-Capillary: 102 mg/dL — ABNORMAL HIGH (ref 70–99)

## 2024-01-15 MED ORDER — MECLIZINE HCL 25 MG PO TABS: 25.0000 mg | ORAL_TABLET | Freq: Three times a day (TID) | ORAL | 0 refills | Status: AC | PRN

## 2024-01-15 MED ORDER — MECLIZINE HCL 25 MG PO TABS
25.0000 mg | ORAL_TABLET | Freq: Once | ORAL | Status: AC
  Administered 2024-01-15: 25 mg via ORAL
  Filled 2024-01-15: qty 1

## 2024-01-15 MED ORDER — IOHEXOL 350 MG/ML SOLN
75.0000 mL | Freq: Once | INTRAVENOUS | Status: AC | PRN
  Administered 2024-01-15: 75 mL via INTRAVENOUS

## 2024-01-15 MED ORDER — LACTATED RINGERS IV BOLUS
1000.0000 mL | Freq: Once | INTRAVENOUS | Status: AC
  Administered 2024-01-15: 1000 mL via INTRAVENOUS

## 2024-01-15 MED ORDER — GADOBUTROL 1 MMOL/ML IV SOLN
10.0000 mL | Freq: Once | INTRAVENOUS | Status: AC | PRN
  Administered 2024-01-15: 10 mL via INTRAVENOUS

## 2024-01-15 NOTE — ED Notes (Signed)
 Report given to Delon, CHARITY FUNDRAISER at [facility].

## 2024-01-15 NOTE — ED Triage Notes (Signed)
 Pt caox4 ambulatory c/o dizziness, feeling off balance and nausea intermittent since Friday night. Pt further states he had an ocular migraine this morning with vision field cut. Denies extremity weakness and speech abnormalities.

## 2024-01-15 NOTE — ED Notes (Signed)
 Pt unable to provide urine sample at this time

## 2024-01-15 NOTE — ED Provider Notes (Signed)
 Patient sent here for management drawbridge for dizziness and concern for possible central vertigo.  Was given Antivert  prior to transfer here.  States his dizziness has become much better.  On exam here, he has no focal weakness.  Brain MRI was negative for stroke at this time.  Patient's MR angiogram of head and neck without significant stenosis or vascular issues.  Will discharge home on Antivert .   Dasie Faden, MD 01/15/24 2131

## 2024-01-15 NOTE — ED Notes (Signed)
 Patient going POV to [facility] for MRI--Dr. Rogelia is accepting

## 2024-01-15 NOTE — ED Notes (Signed)
 CBG was 102. RN Cortney was notified.
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
## 2024-01-15 NOTE — ED Notes (Signed)
 Pt endorses contrast allergy, allergies updated

## 2024-01-15 NOTE — ED Provider Notes (Signed)
 [employer] EMERGENCY DEPARTMENT AT [facility] Provider Note   CSN: [id number] Arrival date & time: 01/15/24  1505     Patient presents with: Dizziness   Benjamin Cortez is a 78 y.o. male.   HPI 78 year old male presents with dizziness and vision abnormalities.  Starting 3 nights ago he noticed some mild unsteadiness.  Happened after he was fixing brakes on a car.  He then has had intermittent and progressively worsening symptoms.  Symptoms are primarily when he is up but have also occurred at rest.  Feels like he is on a ship moving.  There is a slight discomfort in his head but not really a headache.  Some nausea without vomiting.  No weakness or numbness in his extremities.  Symptoms have been getting progressively worse.  Starting today he has also been having symptoms of what he remembers is an ocular migraine.  Years ago he had similar vision symptoms, will feel like there is a lightening bolt going across his visual field, either from the left or from the right.  The vision above and below will seem slightly offset and then it will slowly resolve.  The symptoms have been coming and going.  No double vision.  Prior to Admission medications   Medication Sig Start Date End Date Taking? Authorizing Provider  Alpha-Lipoic Acid 300 MG TABS Take 1 tablet by mouth daily.    [provider]  fluocinonide cream (LIDEX) 0.05 % Apply 1 Application topically 2 (two) times daily as needed. 03/17/21   [provider]  gabapentin  (NEURONTIN ) 100 MG capsule 1 capsules in the am  and 2 at night. 06/22/23   Jimmy Charlie FERNS, MD  losartan -hydrochlorothiazide  (HYZAAR) 50-12.5 MG tablet TAKE 1/2 TABLET BY MOUTH DAILY 04/26/23   Jimmy Charlie FERNS, MD  Misc Natural Products (GLUCOSAMINE CHOND DOUBLE STR PO) Take by mouth.    [provider]  Multiple Vitamin (MULTIVITAMIN) tablet Take 1 tablet by mouth daily.    [provider]  omeprazole  (PRILOSEC) 20 MG capsule TAKE 1  CAPSULE BY MOUTH EVERY DAY 03/27/23   Jimmy Charlie FERNS, MD  phenylephrine  (,USE FOR PREPARATION-H,) 0.25 % suppository Place 1 suppository rectally as needed for hemorrhoids. 08/23/22   Armbruster, Elspeth SQUIBB, MD    Allergies: Penicillins, Tetracycline, Iodinated contrast media, and Codeine    Review of Systems  Eyes:  Positive for visual disturbance.  Gastrointestinal:  Positive for nausea. Negative for vomiting.  Musculoskeletal:  Negative for neck pain.  Neurological:  Positive for dizziness and headaches. Negative for weakness and numbness.    Updated Vital Signs BP (!) 164/62   Pulse (!) 56   Temp (!) 97.5 F (36.4 C)   Resp 17   SpO2 96%   Physical Exam Vitals and nursing note reviewed.  Constitutional:      General: He is not in acute distress.    Appearance: He is well-developed. He is not ill-appearing or diaphoretic.  HENT:     Head: Normocephalic and atraumatic.  Eyes:     Extraocular Movements: Extraocular movements intact.     Pupils: Pupils are equal, round, and reactive to light.     Comments: Grossly normal visual fields  Cardiovascular:     Rate and Rhythm: Normal rate and regular rhythm.     Heart sounds: Normal heart sounds.  Pulmonary:     Effort: Pulmonary effort is normal.     Breath sounds: Normal breath sounds.  Abdominal:     General: There is  no distension.  Skin:    General: Skin is warm and dry.  Neurological:     Mental Status: He is alert.     Comments: CN 3-12 grossly intact. 5/5 strength in all 4 extremities. Grossly normal sensation. Normal finger to nose. Normal gait.     (all labs ordered are listed, but only abnormal results are displayed) Labs Reviewed  COMPREHENSIVE METABOLIC PANEL WITH GFR - Abnormal; Notable for the following components:      Result Value   Glucose, Bld 106 (*)    All other components within normal limits  CBG MONITORING, ED - Abnormal; Notable for the following components:   Glucose-Capillary 102 (*)    All  other components within normal limits  CBC  URINALYSIS, ROUTINE W REFLEX MICROSCOPIC    EKG: EKG Interpretation Date/Time:  Monday January 15 2024 15:20:12 EST Ventricular Rate:  61 PR Interval:  142 QRS Duration:  91 QT Interval:  407 QTC Calculation: 410 R Axis:   -31  Text Interpretation: Sinus rhythm Left axis deviation Low voltage, precordial leads Abnormal R-wave progression, early transition Borderline T abnormalities, anterior leads overall similar to 2021 Confirmed by Freddi Hamilton [id number]) on 01/15/2024 3:54:00 PM  Radiology: CT Head Wo Contrast Result Date: 01/15/2024 EXAM: CT HEAD WITHOUT CONTRAST 01/15/2024 06:07:10 PM TECHNIQUE: CT of the head was performed without the administration of intravenous contrast. Automated exposure control, iterative reconstruction, and/or weight based adjustment of the mA/kV was utilized to reduce the radiation dose to as low as reasonably achievable. COMPARISON: None available. CLINICAL HISTORY: Neuro deficit, acute, stroke suspected FINDINGS: BRAIN AND VENTRICLES: No acute hemorrhage. No evidence of acute infarct. No hydrocephalus. No extra-axial collection. No mass effect or midline shift. Moderate patchy white matter hypodensities are compatible with chronic microvascular ischemic change. ORBITS: No acute abnormality. SINUSES: No acute abnormality. SOFT TISSUES AND SKULL: No acute soft tissue abnormality. No skull fracture. IMPRESSION: 1. No acute intracranial abnormality. Electronically signed by: Gilmore Molt MD 01/15/2024 06:20 PM EST RP Workstation: [id number]     Procedures   Medications Ordered in the ED  lactated ringers  bolus 1,000 mL (0 mLs Intravenous Stopped 01/15/24 1744)  meclizine  (ANTIVERT ) tablet 25 mg (25 mg Oral Given 01/15/24 1623)  iohexol  (OMNIPAQUE ) 350 MG/ML injection 75 mL (75 mLs Intravenous Contrast Given 01/15/24 1744)                                    Medical Decision Making Amount and/or Complexity of  Data Reviewed Labs: ordered.    Details: Normal WBC Radiology: ordered and independent interpretation performed.    Details: No head bleed ECG/medicine tests: ordered and independent interpretation performed.    Details: Nonspecific changes  Risk Prescription drug management.   Patient presents with what sounds like vertigo on and off for multiple days.  Has some odd visual symptoms that he attributes to prior ocular migraines.  These are also coming and going.  Otherwise his neuroexam is pretty benign.  I had originally planned a CTA of head and neck but he later discussed with the radiology tech that he has had some issues with contrast but over 30 years ago.  Unclear if this was a true allergy or would be allergic to our modern day contrast.  Either way, he prefers not to wait as long as it would take for premedication so a non-con CT head was obtained which is negative.  He  would have likely needed an MRI either way so he will go to [facility], ED for MRI and MRA.  Dr. Rogelia accepts.  Patient and wife will go POV.     Final diagnoses:  None    ED Discharge Orders     None          Freddi Hamilton, MD 01/15/24 [phone]

## 2024-01-15 NOTE — Discharge Instructions (Addendum)
 Go straight to [facility] for an MRI.  If you develop any new or worsening symptoms and route then pull over and call 911.

## 2024-01-15 NOTE — ED Triage Notes (Signed)
 Pt presents as ambulatory transfer from [facility] ED for MRI

## 2024-01-16 NOTE — Telephone Encounter (Signed)
 Noted seen at ER with dx vertigo.  MRI negative or stroke, MRA head/neck reassuring as well.

## 2024-03-28 ENCOUNTER — Encounter: Payer: PPO | Admitting: Family

## 2024-04-18 ENCOUNTER — Ambulatory Visit
